# Patient Record
Sex: Male | Born: 1935 | Race: White | Hispanic: Refuse to answer | Marital: Married | Smoking: Former smoker
Health system: Southern US, Community
[De-identification: ages and names within clinical notes are randomized; demographics above are authoritative.]

## PROBLEM LIST (undated history)

## (undated) DIAGNOSIS — K635 Polyp of colon: Secondary | ICD-10-CM

## (undated) DIAGNOSIS — N400 Enlarged prostate without lower urinary tract symptoms: Secondary | ICD-10-CM

## (undated) DIAGNOSIS — K5790 Diverticulosis of intestine, part unspecified, without perforation or abscess without bleeding: Secondary | ICD-10-CM

## (undated) DIAGNOSIS — T7840XA Allergy, unspecified, initial encounter: Secondary | ICD-10-CM

## (undated) DIAGNOSIS — I10 Essential (primary) hypertension: Secondary | ICD-10-CM

## (undated) DIAGNOSIS — E785 Hyperlipidemia, unspecified: Secondary | ICD-10-CM

## (undated) DIAGNOSIS — K219 Gastro-esophageal reflux disease without esophagitis: Secondary | ICD-10-CM

## (undated) DIAGNOSIS — M1990 Unspecified osteoarthritis, unspecified site: Secondary | ICD-10-CM

## (undated) DIAGNOSIS — H269 Unspecified cataract: Secondary | ICD-10-CM

## (undated) DIAGNOSIS — R361 Hematospermia: Secondary | ICD-10-CM

## (undated) DIAGNOSIS — Z974 Presence of external hearing-aid: Secondary | ICD-10-CM

## (undated) HISTORY — DX: Diverticulosis of intestine, part unspecified, without perforation or abscess without bleeding: K57.90

## (undated) HISTORY — DX: Unspecified cataract: H26.9

## (undated) HISTORY — DX: Hyperlipidemia, unspecified: E78.5

## (undated) HISTORY — PX: UPPER GI ENDOSCOPY: SHX6162

## (undated) HISTORY — PX: CYSTECTOMY: SUR359

## (undated) HISTORY — DX: Benign prostatic hyperplasia without lower urinary tract symptoms: N40.0

## (undated) HISTORY — DX: Hematospermia: R36.1

## (undated) HISTORY — DX: Allergy, unspecified, initial encounter: T78.40XA

## (undated) HISTORY — DX: Essential (primary) hypertension: I10

## (undated) HISTORY — DX: Polyp of colon: K63.5

## (undated) HISTORY — DX: Gastro-esophageal reflux disease without esophagitis: K21.9

---
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
## 2005-10-10 ENCOUNTER — Ambulatory Visit: Payer: Self-pay | Admitting: Internal Medicine

## 2006-01-10 ENCOUNTER — Ambulatory Visit: Payer: Self-pay | Admitting: Internal Medicine

## 2006-02-23 ENCOUNTER — Ambulatory Visit: Payer: Self-pay | Admitting: Internal Medicine

## 2006-07-24 HISTORY — PX: COLONOSCOPY: SHX174

## 2006-08-15 ENCOUNTER — Ambulatory Visit: Payer: Self-pay | Admitting: Urology

## 2006-12-04 ENCOUNTER — Ambulatory Visit: Payer: Self-pay | Admitting: Internal Medicine

## 2006-12-10 ENCOUNTER — Encounter: Payer: Self-pay | Admitting: Internal Medicine

## 2006-12-10 ENCOUNTER — Ambulatory Visit: Payer: Self-pay | Admitting: Internal Medicine

## 2007-06-13 ENCOUNTER — Ambulatory Visit: Payer: Self-pay | Admitting: Ophthalmology

## 2011-07-25 DIAGNOSIS — K635 Polyp of colon: Secondary | ICD-10-CM

## 2011-07-25 HISTORY — DX: Polyp of colon: K63.5

## 2011-11-07 ENCOUNTER — Encounter: Payer: Self-pay | Admitting: Internal Medicine

## 2011-11-13 ENCOUNTER — Encounter: Payer: Self-pay | Admitting: Internal Medicine

## 2011-12-29 ENCOUNTER — Encounter: Payer: Self-pay | Admitting: Internal Medicine

## 2011-12-29 ENCOUNTER — Ambulatory Visit (AMBULATORY_SURGERY_CENTER): Payer: MEDICARE | Admitting: *Deleted

## 2011-12-29 VITALS — Ht 72.0 in | Wt 183.1 lb

## 2011-12-29 DIAGNOSIS — Z1211 Encounter for screening for malignant neoplasm of colon: Secondary | ICD-10-CM

## 2011-12-29 MED ORDER — MOVIPREP 100 G PO SOLR: ORAL | Status: DC

## 2012-01-04 ENCOUNTER — Telehealth: Payer: Self-pay | Admitting: Internal Medicine

## 2012-01-04 NOTE — Telephone Encounter (Signed)
Sounds like he should get prostrate problems taken care of first - not wrong to do colonoscopy but prepping could be symptomatic

## 2012-01-04 NOTE — Telephone Encounter (Signed)
Michael Decker pt scheduled for colon recall 01/12/12. Pt states he is having prostate trouble, he is having to go to the bathroom about every 2 hours at night and he has an "ache" down there. Pt has an appt with the urologist 01/16/12. Pt wants to know if he should keep the colon as scheduled or if he should wait until prostate trouble addressed. Dr. Leone Payor as doc of the day please advise.

## 2012-01-04 NOTE — Telephone Encounter (Signed)
Spoke with pt and he is aware. Colon appt cancelled. Pt will call us back to reschedule once he has seen the urologist.

## 2012-01-12 ENCOUNTER — Encounter: Payer: Self-pay | Admitting: Internal Medicine

## 2012-01-22 HISTORY — PX: COLONOSCOPY: SHX174

## 2012-02-12 ENCOUNTER — Ambulatory Visit (AMBULATORY_SURGERY_CENTER): Payer: MEDICARE | Admitting: Internal Medicine

## 2012-02-12 ENCOUNTER — Encounter: Payer: Self-pay | Admitting: Internal Medicine

## 2012-02-12 VITALS — BP 151/73 | HR 67 | Temp 97.7°F | Resp 18 | Ht 72.0 in | Wt 183.0 lb

## 2012-02-12 DIAGNOSIS — D126 Benign neoplasm of colon, unspecified: Secondary | ICD-10-CM

## 2012-02-12 DIAGNOSIS — Z1211 Encounter for screening for malignant neoplasm of colon: Secondary | ICD-10-CM

## 2012-02-12 DIAGNOSIS — Z86010 Personal history of colonic polyps: Secondary | ICD-10-CM

## 2012-02-12 MED ORDER — SODIUM CHLORIDE 0.9 % IV SOLN: 500.0000 mL | INTRAVENOUS | Status: DC

## 2012-02-12 NOTE — Op Note (Signed)
[facility] [address] [place], [place]  [address]  COLONOSCOPY PROCEDURE REPORT  PATIENT:  Michael, Decker  MR#:  [id number] BIRTHDATE:  [birth year], 77 yrs. old  GENDER:  male ENDOSCOPIST:  Wilhemina Bonito. Eda Keys, MD REF. BY:  Surveillance Program Recall, PROCEDURE DATE:  02/12/2012 PROCEDURE:  Colonoscopy with snare polypectomy x 1 ASA CLASS:  Class II INDICATIONS:  history of pre-cancerous (adenomatous) colon polyps, surveillance and high-risk screening ; prior exams 1996,97,99,2002,05,08 MEDICATIONS:   MAC sedation, administered by CRNA, propofol (Diprivan) 210 mg IV  DESCRIPTION OF PROCEDURE:   After the risks benefits and alternatives of the procedure were thoroughly explained, informed consent was obtained.  Digital rectal exam was performed and revealed no abnormalities.   The LB CF-[id number] endoscope was introduced through the anus and advanced to the cecum, which was identified by both the appendix and ileocecal valve, without limitations.  The quality of the prep was excellent, using MoviPrep.  The instrument was then slowly withdrawn as the colon was fully examined. <<PROCEDUREIMAGES>>  FINDINGS:  A 5mm polyp was found in the proximal transverse colon and snared without cautery. Retrieval was successful. Moderate diverticulosis was found in the sigmoid colon.  Otherwise normal colonoscopy without other polyps, masses, vascular ectasias, or inflammatory changes.   Retroflexed views in the rectum revealed no abnormalities.    The time to cecum =  2:33  minutes. The scope was then withdrawn in   9:41  minutes from the cecum and the procedure completed.  COMPLICATIONS:  None  ENDOSCOPIC IMPRESSION: 1) Diminutive polyp in the proximal transverse colon - removed 2) Moderate diverticulosis in the sigmoid colon 3) Otherwise normal colonoscopy  RECOMMENDATIONS: 1) Follow up colonoscopy in 5 years  ______________________________ Wilhemina Bonito. Eda Keys,  MD  CC:  Vonita Moss, MD; The Patient  n. eSIGNED:   Wilhemina Bonito. Eda Keys at 02/12/2012 04:46 PM  Blain Pais, [phone]

## 2012-02-12 NOTE — Progress Notes (Signed)
Patient did not experience any of the following events: a burn prior to discharge; a fall within the facility; wrong site/side/patient/procedure/implant event; or a hospital transfer or hospital admission upon discharge from the facility. (G8907) Patient did not have preoperative order for IV antibiotic SSI prophylaxis. (G8918)  

## 2012-02-12 NOTE — Patient Instructions (Addendum)

## 2012-02-13 ENCOUNTER — Telehealth: Payer: Self-pay | Admitting: *Deleted

## 2012-02-13 NOTE — Telephone Encounter (Signed)
  Follow up Call-  Call back number 02/12/2012  Post procedure Call Back phone  # [phone]  Permission to leave phone message Yes     Patient questions:  Do you have a fever, pain , or abdominal swelling? no Pain Score  0 *  Have you tolerated food without any problems? yes  Have you been able to return to your normal activities? yes  Do you have any questions about your discharge instructions: Diet   no Medications  no Follow up visit  no  Do you have questions or concerns about your Care? no  Actions: * If pain score is 4 or above: No action needed, pain <4.

## 2012-02-19 ENCOUNTER — Encounter: Payer: Self-pay | Admitting: Internal Medicine

## 2012-07-24 HISTORY — PX: BUNIONECTOMY: SHX129

## 2013-01-29 ENCOUNTER — Ambulatory Visit: Payer: Self-pay | Admitting: Anesthesiology

## 2013-01-30 ENCOUNTER — Ambulatory Visit: Payer: Self-pay | Admitting: Podiatry

## 2013-06-17 LAB — COMPREHENSIVE METABOLIC PANEL
ALK PHOS: 71 U/L
ALT: 22 U/L (ref 10–40)
AST: 28 U/L
BILIRUBIN TOTAL: 0.6 mg/dL
CREATININE: 1.24
GFR: 56
GLUCOSE: 85

## 2013-06-17 LAB — LIPID PANEL
Cholesterol: 181 mg/dL (ref 0–200)
HDL: 58 mg/dL (ref 35–70)
LDL CALC: 82
Triglycerides: 205

## 2013-06-17 LAB — VITAMIN B12: B-12, serum: 422

## 2013-06-17 LAB — TSH: TSH: 1.35

## 2013-06-17 LAB — CBC
HGB: 14.9 g/dL
PLATELET COUNT: 222
WBC: 6.9

## 2013-07-01 LAB — PSA: PSA: 0.6

## 2013-08-08 ENCOUNTER — Ambulatory Visit: Payer: MEDICARE | Admitting: Family Medicine

## 2013-10-28 ENCOUNTER — Ambulatory Visit: Payer: MEDICARE | Admitting: Family Medicine

## 2013-10-28 ENCOUNTER — Encounter: Payer: Self-pay | Admitting: Family Medicine

## 2013-10-28 ENCOUNTER — Ambulatory Visit (INDEPENDENT_AMBULATORY_CARE_PROVIDER_SITE_OTHER): Payer: Medicare HMO | Admitting: Family Medicine

## 2013-10-28 VITALS — BP 130/70 | HR 52 | Temp 97.3°F | Ht 70.5 in | Wt 191.0 lb

## 2013-10-28 DIAGNOSIS — D126 Benign neoplasm of colon, unspecified: Secondary | ICD-10-CM

## 2013-10-28 DIAGNOSIS — E785 Hyperlipidemia, unspecified: Secondary | ICD-10-CM | POA: Insufficient documentation

## 2013-10-28 DIAGNOSIS — N400 Enlarged prostate without lower urinary tract symptoms: Secondary | ICD-10-CM | POA: Insufficient documentation

## 2013-10-28 DIAGNOSIS — K635 Polyp of colon: Secondary | ICD-10-CM | POA: Insufficient documentation

## 2013-10-28 DIAGNOSIS — K219 Gastro-esophageal reflux disease without esophagitis: Secondary | ICD-10-CM | POA: Insufficient documentation

## 2013-10-28 DIAGNOSIS — I10 Essential (primary) hypertension: Secondary | ICD-10-CM

## 2013-10-28 NOTE — Assessment & Plan Note (Signed)
Chronic, stable. Continue avodart.  Pt has seen Stoioff in past.

## 2013-10-28 NOTE — Progress Notes (Signed)
BP 130/70  Pulse 52  Temp(Src) 97.3 F (36.3 C) (Oral)  Ht 5' 10.5" (1.791 m)  Wt 191 lb (86.637 kg)  BMI 27.01 kg/m2  SpO2 98%   CC: new pt to establish  Subjective:    Patient ID: Michael Decker, male    DOB: 01-07-1934, 80 y.o.   MRN: [id number]  HPI: Michael Decker is a 80 y.o. male presenting on 10/28/2013 for Establish Care   Prior saw Dr. Jeananne Rama. No questions/concerns today.  HLD - mild off meds  HTN - chronic, stable.  774-128 systolic at home.  Compliant with toprol xl 25mg  daily and micardis 80mg  daily.  Denies low bp readings or sxs like dizziness.  No HA, vision changes, CP/tightness, SOB, leg swelling.  BPH - seen by Dr. Bernardo Heater and started on avodart  Some foot paresthesias with prolonged standing.  Worse since R bunionectomy  Preventative: Last CPE 06/2013 Colonoscopy - 01/2012 with Dr. Henrene Pastor 1 TA rec rpt 5 yrs  Lives with wife Occupation: prior worked with Clinical cytogeneticist as Recruitment consultant; volunteers at [employer] for [employer] in [place],  [place]: BS Activity: works out at [employer] 3x/wk on recumbent bike Diet: some water, fruits/vegetables daily  Relevant past medical, surgical, family and social history reviewed and updated as indicated.  Allergies and medications reviewed and updated. Current Outpatient Prescriptions on File Prior to Visit  Medication Sig  . aspirin EC 81 MG tablet Take 81 mg by mouth daily.  Marland Kitchen dutasteride (AVODART) 0.5 MG capsule Take 0.5 mg by mouth every other day.  . fish oil-omega-3 fatty acids 1000 MG capsule Take 2 g by mouth daily.  . metoprolol succinate (TOPROL-XL) 25 MG 24 hr tablet Take 1 tablet by mouth Daily.  Marland Kitchen MICARDIS 80 MG tablet Take 1 tablet by mouth Daily.  . Multiple Vitamins-Minerals (MULTIVITAMIN WITH MINERALS) tablet Take 1 tablet by mouth daily.   No current facility-administered medications on file prior to visit.    Review of Systems Per HPI unless specifically indicated above    Objective:    BP 130/70   Pulse 52  Temp(Src) 97.3 F (36.3 C) (Oral)  Ht 5' 10.5" (1.791 m)  Wt 191 lb (86.637 kg)  BMI 27.01 kg/m2  SpO2 98%  Physical Exam  Nursing note and vitals reviewed. Constitutional: He is oriented to person, place, and time. He appears well-developed and well-nourished. No distress.  HENT:  Head: Normocephalic and atraumatic.  Right Ear: Hearing, tympanic membrane, external ear and ear canal normal.  Left Ear: Hearing, tympanic membrane, external ear and ear canal normal.  Nose: Nose normal.  Mouth/Throat: Uvula is midline, oropharynx is clear and moist and mucous membranes are normal. No oropharyngeal exudate, posterior oropharyngeal edema or posterior oropharyngeal erythema.  Eyes: Conjunctivae and EOM are normal. Pupils are equal, round, and reactive to light. No scleral icterus.  Neck: Normal range of motion. Neck supple. Carotid bruit is not present. No thyromegaly present.  Cardiovascular: Normal rate, regular rhythm, normal heart sounds and intact distal pulses.   No murmur heard. Pulses:      Radial pulses are 2+ on the right side, and 2+ on the left side.  Pulmonary/Chest: Effort normal and breath sounds normal. No respiratory distress. He has no wheezes. He has no rales.  Musculoskeletal: Normal range of motion. He exhibits no edema.  Lymphadenopathy:    He has no cervical adenopathy.  Neurological: He is alert and oriented to person, place, and time.  CN grossly intact, station  and gait intact  Skin: Skin is warm and dry. No rash noted.  Psychiatric: He has a normal mood and affect. His behavior is normal. Judgment and thought content normal.   No results found for this or any previous visit.    Assessment & Plan:  Pt signed ROI today for records from Dr. Jeananne Rama, this was sent off today.   Problem List Items Addressed This Visit   HTN (hypertension) - Primary     Chronic, stable. Continue meds as up to now. Mild bradycardia noted today - pt asxs. Consider  decrease in metoprolol if pt remains bradycardic next visit.    HLD (hyperlipidemia)     Mild, off meds.  Check FLP next fasting blood work.    GERD (gastroesophageal reflux disease)   BPH (benign prostatic hypertrophy)   Colon polyps       Follow up plan: Return in about 8 months (around 06/29/2014), or as needed, for annual exam, prior fasting for blood work.

## 2013-10-28 NOTE — Progress Notes (Signed)
Pre visit review using our clinic review tool, if applicable. No additional management support is needed unless otherwise documented below in the visit note. 

## 2013-10-28 NOTE — Assessment & Plan Note (Signed)
Mild, off meds.  Check FLP next fasting blood work.

## 2013-10-28 NOTE — Patient Instructions (Signed)
Good to meet you today, call us with questions. Return as needed for follow up or at end of year when next physical is due. No changes today.

## 2013-10-28 NOTE — Assessment & Plan Note (Signed)
Chronic, stable. Continue meds as up to now. Mild bradycardia noted today - pt asxs. Consider decrease in metoprolol if pt remains bradycardic next visit.

## 2013-10-29 ENCOUNTER — Telehealth: Payer: Self-pay | Admitting: Family Medicine

## 2013-10-29 NOTE — Telephone Encounter (Signed)
Relevant patient education assigned to patient using Emmi. ° °

## 2013-11-22 ENCOUNTER — Encounter: Payer: Self-pay | Admitting: Family Medicine

## 2013-11-24 ENCOUNTER — Encounter: Payer: Self-pay | Admitting: *Deleted

## 2013-11-28 DIAGNOSIS — M2010 Hallux valgus (acquired), unspecified foot: Secondary | ICD-10-CM | POA: Insufficient documentation

## 2014-01-15 ENCOUNTER — Ambulatory Visit (INDEPENDENT_AMBULATORY_CARE_PROVIDER_SITE_OTHER): Payer: Medicare HMO | Admitting: Family Medicine

## 2014-01-15 ENCOUNTER — Encounter: Payer: Self-pay | Admitting: Family Medicine

## 2014-01-15 VITALS — BP 118/60 | HR 60 | Temp 98.4°F | Wt 188.5 lb

## 2014-01-15 DIAGNOSIS — I10 Essential (primary) hypertension: Secondary | ICD-10-CM

## 2014-01-15 MED ORDER — TELMISARTAN 80 MG PO TABS: 80.0000 mg | ORAL_TABLET | Freq: Every day | ORAL | Status: DC

## 2014-01-15 MED ORDER — METOPROLOL SUCCINATE ER 25 MG PO TB24: 25.0000 mg | ORAL_TABLET | Freq: Every day | ORAL | Status: DC

## 2014-01-15 NOTE — Progress Notes (Signed)
Pre visit review using our clinic review tool, if applicable. No additional management support is needed unless otherwise documented below in the visit note. 

## 2014-01-15 NOTE — Patient Instructions (Signed)
I've printed out a prescription for 2 blood pressure medications for you. Good to see you, call us with questions. Return after November for wellness exam.

## 2014-01-15 NOTE — Progress Notes (Signed)
BP 118/60  Pulse 60  Temp(Src) 98.4 F (36.9 C) (Oral)  Wt 188 lb 8 oz (85.503 kg)   CC: med refill  Subjective:    Patient ID: Michael Decker, male    DOB: 10-Oct-1933, 80 y.o.   MRN: [id number]  HPI: Michael Decker is a 80 y.o. male presenting on 01/15/2014 for Medication Refill   HTN - Compliant with current antihypertensive regimen of telmisartan 80mg  daily and metoprolol succinate 25mg  daily.  Does check blood pressures at home: 073-710 systolic.  No low blood pressure readings or symptoms of dizziness/syncope.  Denies HA, vision changes, CP/tightness, SOB, leg swelling.   BPH - has established with Dr. Alinda Money at [facility]rology.  Relevant past medical, surgical, family and social history reviewed and updated as indicated.  Allergies and medications reviewed and updated. Current Outpatient Prescriptions on File Prior to Visit  Medication Sig  . aspirin EC 81 MG tablet Take 81 mg by mouth daily.  Marland Kitchen dutasteride (AVODART) 0.5 MG capsule Take 0.5 mg by mouth every other day.  . fish oil-omega-3 fatty acids 1000 MG capsule Take 2 g by mouth daily.  . Multiple Vitamins-Minerals (MULTIVITAMIN WITH MINERALS) tablet Take 1 tablet by mouth daily.   No current facility-administered medications on file prior to visit.    Review of Systems Per HPI unless specifically indicated above    Objective:    BP 118/60  Pulse 60  Temp(Src) 98.4 F (36.9 C) (Oral)  Wt 188 lb 8 oz (85.503 kg)  Physical Exam  Nursing note and vitals reviewed. Constitutional: He appears well-developed and well-nourished. No distress.  HENT:  Mouth/Throat: Oropharynx is clear and moist. No oropharyngeal exudate.  Neck: No thyromegaly present.  Cardiovascular: Normal rate, regular rhythm, normal heart sounds and intact distal pulses.   No murmur heard. Pulmonary/Chest: Effort normal and breath sounds normal. No respiratory distress. He has no wheezes. He has no rales.  Musculoskeletal: He exhibits no  edema.  Psychiatric: He has a normal mood and affect.   Results for orders placed in visit on 11/24/13  PSA      Result Value Ref Range   PSA 0.6    CBC      Result Value Ref Range   WBC 6.9     HGB 14.9     platelet count 222    COMPREHENSIVE METABOLIC PANEL      Result Value Ref Range   Glucose 85     Creat 1.24     GFR 56     Total Bilirubin 0.6     Alkaline Phosphatase 71     AST 28     ALT 22  10 - 40 U/L  LIPID PANEL      Result Value Ref Range   Cholesterol 181  0 - 200 mg/dL   Triglycerides 205     HDL 58  35 - 70 mg/dL   LDL (calc) 82    TSH      Result Value Ref Range   TSH 1.350    VITAMIN B12      Result Value Ref Range   B-12, serum 422        Assessment & Plan:   Problem List Items Addressed This Visit   HTN (hypertension) - Primary     Chronic, stable. Continue med regimen as up to now. Check renal panel today. Telmisartan samples provided today.    Relevant Medications      metoprolol succinate (TOPROL-XL) 24  hr tablet      telmisartan (MICARDIS) tablet   Other Relevant Orders      Renal function panel       Follow up plan: Return in about 6 months (around 07/17/2014), or if symptoms worsen or fail to improve, for annual exam, prior fasting for blood work.

## 2014-01-15 NOTE — Assessment & Plan Note (Addendum)
Chronic, stable. Continue med regimen as up to now. Check renal panel today. Telmisartan samples provided today.

## 2014-01-16 ENCOUNTER — Encounter: Payer: Self-pay | Admitting: *Deleted

## 2014-01-16 LAB — RENAL FUNCTION PANEL
Albumin: 3.8 g/dL (ref 3.5–5.2)
BUN: 21 mg/dL (ref 6–23)
CO2: 31 meq/L (ref 19–32)
Calcium: 9 mg/dL (ref 8.4–10.5)
Chloride: 103 meq/L (ref 96–112)
Creatinine, Ser: 1.3 mg/dL (ref 0.4–1.5)
GFR: 59.41 mL/min — ABNORMAL LOW (ref >60.00)
Glucose, Bld: 95 mg/dL (ref 70–99)
Phosphorus: 3.5 mg/dL (ref 2.3–4.6)
Potassium: 4.4 meq/L (ref 3.5–5.1)
Sodium: 137 meq/L (ref 135–145)

## 2014-04-29 ENCOUNTER — Ambulatory Visit (INDEPENDENT_AMBULATORY_CARE_PROVIDER_SITE_OTHER): Payer: Medicare HMO

## 2014-04-29 DIAGNOSIS — Z23 Encounter for immunization: Secondary | ICD-10-CM

## 2014-06-13 ENCOUNTER — Other Ambulatory Visit: Payer: Self-pay | Admitting: Family Medicine

## 2014-06-13 DIAGNOSIS — N400 Enlarged prostate without lower urinary tract symptoms: Secondary | ICD-10-CM

## 2014-06-13 DIAGNOSIS — I10 Essential (primary) hypertension: Secondary | ICD-10-CM

## 2014-06-13 DIAGNOSIS — E785 Hyperlipidemia, unspecified: Secondary | ICD-10-CM

## 2014-06-16 ENCOUNTER — Other Ambulatory Visit (INDEPENDENT_AMBULATORY_CARE_PROVIDER_SITE_OTHER): Payer: Medicare HMO

## 2014-06-16 DIAGNOSIS — N400 Enlarged prostate without lower urinary tract symptoms: Secondary | ICD-10-CM

## 2014-06-16 DIAGNOSIS — E785 Hyperlipidemia, unspecified: Secondary | ICD-10-CM

## 2014-06-16 DIAGNOSIS — I10 Essential (primary) hypertension: Secondary | ICD-10-CM

## 2014-06-16 LAB — LIPID PANEL
CHOLESTEROL: 196 mg/dL (ref 0–200)
HDL: 58.2 mg/dL (ref >39.00)
LDL Cholesterol: 113 mg/dL — ABNORMAL HIGH (ref 0–99)
NonHDL: 137.8
Total CHOL/HDL Ratio: 3
Triglycerides: 122 mg/dL (ref 0.0–149.0)
VLDL: 24.4 mg/dL (ref 0.0–40.0)

## 2014-06-16 LAB — RENAL FUNCTION PANEL
Albumin: 3.6 g/dL (ref 3.5–5.2)
BUN: 21 mg/dL (ref 6–23)
CO2: 29 meq/L (ref 19–32)
CREATININE: 1.1 mg/dL (ref 0.4–1.5)
Calcium: 9 mg/dL (ref 8.4–10.5)
Chloride: 102 mEq/L (ref 96–112)
GFR: 68.06 mL/min (ref >60.00)
Glucose, Bld: 87 mg/dL (ref 70–99)
Phosphorus: 2.9 mg/dL (ref 2.3–4.6)
Potassium: 4.4 mEq/L (ref 3.5–5.1)
Sodium: 139 mEq/L (ref 135–145)

## 2014-06-16 LAB — PSA: PSA: 0.53 ng/mL (ref 0.10–4.00)

## 2014-06-23 ENCOUNTER — Ambulatory Visit (INDEPENDENT_AMBULATORY_CARE_PROVIDER_SITE_OTHER): Payer: Medicare HMO | Admitting: Family Medicine

## 2014-06-23 ENCOUNTER — Encounter: Payer: Self-pay | Admitting: Family Medicine

## 2014-06-23 VITALS — BP 120/68 | HR 56 | Temp 97.7°F | Ht 71.5 in | Wt 191.2 lb

## 2014-06-23 DIAGNOSIS — Z23 Encounter for immunization: Secondary | ICD-10-CM

## 2014-06-23 DIAGNOSIS — Z7189 Other specified counseling: Secondary | ICD-10-CM | POA: Insufficient documentation

## 2014-06-23 DIAGNOSIS — E785 Hyperlipidemia, unspecified: Secondary | ICD-10-CM

## 2014-06-23 DIAGNOSIS — Z0000 Encounter for general adult medical examination without abnormal findings: Secondary | ICD-10-CM

## 2014-06-23 DIAGNOSIS — I10 Essential (primary) hypertension: Secondary | ICD-10-CM

## 2014-06-23 DIAGNOSIS — K635 Polyp of colon: Secondary | ICD-10-CM

## 2014-06-23 DIAGNOSIS — N400 Enlarged prostate without lower urinary tract symptoms: Secondary | ICD-10-CM

## 2014-06-23 DIAGNOSIS — J3489 Other specified disorders of nose and nasal sinuses: Secondary | ICD-10-CM | POA: Insufficient documentation

## 2014-06-23 NOTE — Assessment & Plan Note (Signed)
Chronic, stable nocturia x1-2. Followed by Dr Alinda Money.

## 2014-06-23 NOTE — Progress Notes (Signed)
BP 120/68 mmHg  Pulse 56  Temp(Src) 97.7 F (36.5 C) (Oral)  Ht 5' 11.5" (1.816 m)  Wt 191 lb 4 oz (86.75 kg)  BMI 26.30 kg/m2   CC: medicare wellness visit  Subjective:    Patient ID: Michael Decker, male    DOB: April 20, 1934, 80 y.o.   MRN: [id number]  HPI: Michael Decker is a 80 y.o. male presenting on 06/23/2014 for Annual Exam   Chronic bilateral rhinorrhea. In am associated with sinus congestion. Allergic to dust mites. Prior on allergy shots which helped Donneta Romberg). Chlorpheniramine helped as well.  Preventative: Colonoscopy - 01/2012 with Dr. Henrene Pastor 1 TA rec rpt 5 yrs Prostate - checked by Dr Alinda Money - h/o BPH. On avodart. Flu 04/2014 Pneumovax 2002, prevnar today Tdap 2010 zostavax 2013 Advanced planning - has at home. Wife is HCPOA then daughter.  Lives with wife Occupation: prior worked with Clinical cytogeneticist as Recruitment consultant; volunteers at [employer] for [employer] in [place],  [employer]: BS Activity: works out at [employer] 3x/wk on recumbent bike Diet: some water, fruits/vegetables daily  Relevant past medical, surgical, family and social history reviewed and updated as indicated. Interim medical history since our last visit reviewed. Allergies and medications reviewed and updated.  Current Outpatient Prescriptions on File Prior to Visit  Medication Sig  . aspirin EC 81 MG tablet Take 81 mg by mouth daily.  Marland Kitchen dutasteride (AVODART) 0.5 MG capsule Take 0.5 mg by mouth every other day.  . fish oil-omega-3 fatty acids 1000 MG capsule Take 2 g by mouth daily.  . metoprolol succinate (TOPROL-XL) 25 MG 24 hr tablet Take 1 tablet (25 mg total) by mouth daily.  . Multiple Vitamins-Minerals (MULTIVITAMIN WITH MINERALS) tablet Take 1 tablet by mouth daily.  Marland Kitchen telmisartan (MICARDIS) 80 MG tablet Take 1 tablet (80 mg total) by mouth daily.   No current facility-administered medications on file prior to visit.    Review of Systems Per HPI unless specifically indicated above       Objective:    BP 120/68 mmHg  Pulse 56  Temp(Src) 97.7 F (36.5 C) (Oral)  Ht 5' 11.5" (1.816 m)  Wt 191 lb 4 oz (86.75 kg)  BMI 26.30 kg/m2  Wt Readings from Last 3 Encounters:  06/23/14 191 lb 4 oz (86.75 kg)  01/15/14 188 lb 8 oz (85.503 kg)  10/28/13 191 lb (86.637 kg)    Physical Exam  Constitutional: He is oriented to person, place, and time. He appears well-developed and well-nourished. No distress.  HENT:  Head: Normocephalic and atraumatic.  Right Ear: Hearing, tympanic membrane, external ear and ear canal normal.  Left Ear: Hearing, tympanic membrane, external ear and ear canal normal.  Nose: Nose normal.  Mouth/Throat: Uvula is midline, oropharynx is clear and moist and mucous membranes are normal. No oropharyngeal exudate, posterior oropharyngeal edema or posterior oropharyngeal erythema.  Eyes: Conjunctivae and EOM are normal. Pupils are equal, round, and reactive to light. No scleral icterus.  Neck: Normal range of motion. Neck supple. Carotid bruit is not present. No thyromegaly present.  Cardiovascular: Normal rate, regular rhythm, normal heart sounds and intact distal pulses.   No murmur heard. Pulses:      Radial pulses are 2+ on the right side, and 2+ on the left side.  Pulmonary/Chest: Effort normal and breath sounds normal. No respiratory distress. He has no wheezes. He has no rales.  Abdominal: Soft. Bowel sounds are normal. He exhibits no distension and no mass. There is no  tenderness. There is no rebound and no guarding.  Musculoskeletal: Normal range of motion. He exhibits no edema.  Lymphadenopathy:    He has no cervical adenopathy.  Neurological: He is alert and oriented to person, place, and time.  CN grossly intact, station and gait intact Recall 3/3  calculation 5/5 serial 7s  Skin: Skin is warm and dry. No rash noted.  Psychiatric: He has a normal mood and affect. His behavior is normal. Judgment and thought content normal.  Nursing note and  vitals reviewed.  Results for orders placed or performed in visit on 06/16/14  PSA  Result Value Ref Range   PSA 0.53 0.10 - 4.00 ng/mL  Lipid panel  Result Value Ref Range   Cholesterol 196 0 - 200 mg/dL   Triglycerides 122.0 0.0 - 149.0 mg/dL   HDL 58.20 >39.00 mg/dL   VLDL 24.4 0.0 - 40.0 mg/dL   LDL Cholesterol 113 (H) 0 - 99 mg/dL   Total CHOL/HDL Ratio 3    NonHDL 137.80   Renal function panel  Result Value Ref Range   Sodium 139 135 - 145 mEq/L   Potassium 4.4 3.5 - 5.1 mEq/L   Chloride 102 96 - 112 mEq/L   CO2 29 19 - 32 mEq/L   Calcium 9.0 8.4 - 10.5 mg/dL   Albumin 3.6 3.5 - 5.2 g/dL   BUN 21 6 - 23 mg/dL   Creatinine, Ser 1.1 0.4 - 1.5 mg/dL   Glucose, Bld 87 70 - 99 mg/dL   Phosphorus 2.9 2.3 - 4.6 mg/dL   GFR 68.06 >60.00 mL/min      Assessment & Plan:   Problem List Items Addressed This Visit    Rhinorrhea    Chronic rhinorrhea in h/o allergic rhinitis. Discussed options. Will continue chlortrimeton at night time, add on daytime long acting antihistamine like claritin or allegra. If this is ineffective, would add astelin nasal spray.    Medicare annual wellness visit, subsequent - Primary    I have personally reviewed the Medicare Annual Wellness questionnaire and have noted 1. The patient's medical and social history 2. Their use of alcohol, tobacco or illicit drugs 3. Their current medications and supplements 4. The patient's functional ability including ADL's, fall risks, home safety risks and hearing or visual impairment. 5. Diet and physical activity 6. Evidence for depression or mood disorders The patients weight, height, BMI have been recorded in the chart.  Hearing and vision has been addressed. I have made referrals, counseling and provided education to the patient based review of the above and I have provided the pt with a written personalized care plan for preventive services. Provider list updated - see scanned questionairre.  Reviewed  preventative protocols and updated unless pt declined.     HTN (hypertension)    Chronic, stable. Continue regimen.    HLD (hyperlipidemia)    Mild off meds. Continue fish oil. Reviewed #s.    Colon polyps    Consider rpt 2018 2/2 h/o TA Henrene Pastor)    BPH (benign prostatic hypertrophy)    Chronic, stable nocturia x1-2. Followed by Dr Alinda Money.    Advanced care planning/counseling discussion    Advanced planning - has at home. Wife is HCPOA then daughter.        Follow up plan: Return in about 1 year (around 06/24/2015), or as needed, for medicare wellness.

## 2014-06-23 NOTE — Assessment & Plan Note (Signed)
Chronic, stable. Continue regimen. 

## 2014-06-23 NOTE — Patient Instructions (Addendum)
prevnar today. Bring me copy of living will/advanced directive to update your chart. Continue chlorpheniramine at night time. May add longer acting antihistamine during the day like allegra or claritin. Let me know if this doesn't help - may try astelin nasal spray. Return as needed or in 1 year for next wellness visit

## 2014-06-23 NOTE — Addendum Note (Signed)
Addended by: Emelia Salisbury C on: 06/23/2014 12:04 PM   Modules accepted: Orders

## 2014-06-23 NOTE — Assessment & Plan Note (Signed)
Chronic rhinorrhea in h/o allergic rhinitis. Discussed options. Will continue chlortrimeton at night time, add on daytime long acting antihistamine like claritin or allegra. If this is ineffective, would add astelin nasal spray.

## 2014-06-23 NOTE — Assessment & Plan Note (Signed)
Advanced planning - has at home. Wife is HCPOA then daughter.

## 2014-06-23 NOTE — Assessment & Plan Note (Signed)
Consider rpt 2018 2/2 h/o TA Henrene Pastor)

## 2014-06-23 NOTE — Assessment & Plan Note (Signed)

## 2014-06-23 NOTE — Assessment & Plan Note (Signed)
Mild off meds. Continue fish oil. Reviewed #s.

## 2014-06-23 NOTE — Progress Notes (Signed)
Pre visit review using our clinic review tool, if applicable. No additional management support is needed unless otherwise documented below in the visit note. 

## 2014-08-13 ENCOUNTER — Telehealth: Payer: Self-pay | Admitting: Internal Medicine

## 2014-08-17 ENCOUNTER — Ambulatory Visit (INDEPENDENT_AMBULATORY_CARE_PROVIDER_SITE_OTHER): Payer: Medicare HMO | Admitting: Physician Assistant

## 2014-08-17 ENCOUNTER — Other Ambulatory Visit (INDEPENDENT_AMBULATORY_CARE_PROVIDER_SITE_OTHER): Payer: Medicare HMO

## 2014-08-17 VITALS — BP 122/54 | HR 64 | Ht 70.5 in | Wt 193.4 lb

## 2014-08-17 DIAGNOSIS — R12 Heartburn: Secondary | ICD-10-CM

## 2014-08-17 DIAGNOSIS — R1011 Right upper quadrant pain: Secondary | ICD-10-CM

## 2014-08-17 DIAGNOSIS — G8929 Other chronic pain: Secondary | ICD-10-CM

## 2014-08-17 DIAGNOSIS — R1010 Upper abdominal pain, unspecified: Secondary | ICD-10-CM

## 2014-08-17 DIAGNOSIS — R1013 Epigastric pain: Secondary | ICD-10-CM

## 2014-08-17 LAB — COMPREHENSIVE METABOLIC PANEL
ALBUMIN: 3.6 g/dL (ref 3.5–5.2)
ALK PHOS: 57 U/L (ref 39–117)
ALT: 18 U/L (ref 0–53)
AST: 22 U/L (ref 0–37)
BILIRUBIN TOTAL: 0.6 mg/dL (ref 0.2–1.2)
BUN: 24 mg/dL — ABNORMAL HIGH (ref 6–23)
CALCIUM: 9.2 mg/dL (ref 8.4–10.5)
CO2: 28 meq/L (ref 19–32)
CREATININE: 1.15 mg/dL (ref 0.40–1.50)
Chloride: 103 mEq/L (ref 96–112)
GFR: 65.31 mL/min (ref >60.00)
Glucose, Bld: 105 mg/dL — ABNORMAL HIGH (ref 70–99)
Potassium: 4.3 mEq/L (ref 3.5–5.1)
Sodium: 137 mEq/L (ref 135–145)
TOTAL PROTEIN: 6.6 g/dL (ref 6.0–8.3)

## 2014-08-17 LAB — CBC WITH DIFFERENTIAL/PLATELET
BASOS PCT: 0.4 % (ref 0.0–3.0)
Basophils Absolute: 0 10*3/uL (ref 0.0–0.1)
Eosinophils Absolute: 0.2 10*3/uL (ref 0.0–0.7)
Eosinophils Relative: 2.5 % (ref 0.0–5.0)
HCT: 41.9 % (ref 39.0–52.0)
Hemoglobin: 14.3 g/dL (ref 13.0–17.0)
LYMPHS ABS: 1.7 10*3/uL (ref 0.7–4.0)
Lymphocytes Relative: 21 % (ref 12.0–46.0)
MCHC: 34.2 g/dL (ref 30.0–36.0)
MCV: 95.6 fl (ref 78.0–100.0)
MONO ABS: 0.8 10*3/uL (ref 0.1–1.0)
Monocytes Relative: 9.7 % (ref 3.0–12.0)
NEUTROS ABS: 5.2 10*3/uL (ref 1.4–7.7)
Neutrophils Relative %: 66.4 % (ref 43.0–77.0)
Platelets: 196 10*3/uL (ref 150.0–400.0)
RBC: 4.38 Mil/uL (ref 4.22–5.81)
RDW: 12.8 % (ref 11.5–15.5)
WBC: 7.9 10*3/uL (ref 4.0–10.5)

## 2014-08-17 LAB — LIPASE: Lipase: 16 U/L (ref 11.0–59.0)

## 2014-08-17 MED ORDER — OMEPRAZOLE 40 MG PO CPDR: DELAYED_RELEASE_CAPSULE | ORAL | Status: DC

## 2014-08-17 NOTE — Progress Notes (Signed)
Patient ID: Michael Decker, male   DOB: [birth year], 81 y.o.   MRN: [id number]   Subjective:    Patient ID: Michael Decker, male    DOB: November 04, 1933, 81 y.o.   MRN: [id number]  HPI  Jaben is a pleasant 81 year old white male known to Dr. Henrene Pastor. He has history of adenomatous colon polyps and diverticulosis. Other medical problems include BPH hypertension and hyperlipidemia. He comes in today with complaints of a 3 week history of heartburn and indigestion. He says he has never had any chronic problems with heartburn but has intermittently had to use Tums or Rolaids. He says he started having more daily symptoms about 3 weeks ago and was eating Tums on a regular basis. About 5 days ago he bought over-the-counter Prilosec and has been taking that. He says the Prilosec helped quite a bit with the heartburn but he now has an unsettled uncomfortable feeling in his upper abdomen primarily in the right side past 3 days. Says she's also cut out coffee and caffeine. His appetite has been okay does not feel any worse postprandially. She's not had any nausea or vomiting his aphasia. No change in his bowel habits melena or hematochezia and his weight has been stable. He has been taking a baby aspirin daily prophylactically no other regular aspirin or NSAIDs. He is concerned about the general upper abdominal and right-sided discomfort. He is leaving for [place] for about 18 days later this week and wanted to get checked before he left on his trip. N o prior abdominal surgery. No regular EtOH Remote EGD was apparently unremarkable.  Review of Systems Pertinent positive and negative review of systems were noted in the above HPI section.  All other review of systems was otherwise negative.  Outpatient Encounter Prescriptions as of 08/17/2014  Medication Sig  . aspirin EC 81 MG tablet Take 81 mg by mouth daily.  Marland Kitchen dutasteride (AVODART) 0.5 MG capsule Take 0.5 mg by mouth every other day.  . fish oil-omega-3 fatty acids  1000 MG capsule Take 2 g by mouth daily.  . metoprolol succinate (TOPROL-XL) 25 MG 24 hr tablet Take 1 tablet (25 mg total) by mouth daily.  . Multiple Vitamins-Minerals (MULTIVITAMIN WITH MINERALS) tablet Take 1 tablet by mouth daily.  Marland Kitchen POTASSIUM CHLORIDE PO Take by mouth. 2 tablets per week  . telmisartan (MICARDIS) 80 MG tablet Take 1 tablet (80 mg total) by mouth daily.  . [DISCONTINUED] omeprazole (PRILOSEC OTC) 20 MG tablet Take 20 mg by mouth daily.  Marland Kitchen omeprazole (PRILOSEC) 40 MG capsule Take 1 tab in the morning.  . [DISCONTINUED] chlorpheniramine (CHLORPHEN) 4 MG tablet Take 4 mg by mouth at bedtime.   No Known Allergies Patient Active Problem List   Diagnosis Date Noted  . Medicare annual wellness visit, subsequent 06/23/2014  . Advanced care planning/counseling discussion 06/23/2014  . Rhinorrhea 06/23/2014  . HTN (hypertension)   . HLD (hyperlipidemia)   . GERD (gastroesophageal reflux disease)   . BPH (benign prostatic hypertrophy)   . Colon polyps    History   Social History  . Marital Status: Married    Spouse Name: N/A    Number of Children: N/A  . Years of Education: N/A   Occupational History  . Not on file.   Social History Main Topics  . Smoking status: Never Smoker   . Smokeless tobacco: Never Used  . Alcohol Use: 4.2 oz/week    7 Cans of beer per week     Comment:  one beer a night (12 oz)  . Drug Use: No  . Sexual Activity:    Partners: Female   Other Topics Concern  . Not on file   Social History Narrative   Lives with wife   Grown children   Occupation: prior worked with Clinical cytogeneticist as Recruitment consultant; volunteers at [employer] in [place]   Edu: BS   Activity: works out at [employer] 3x/wk, on recumbent bike   Diet: some water, fruits/vegetables daily             Mr. Markovitz family history includes CAD in his brother; Sudden death (age of onset: 38) in his father. There is no history of Cancer, Diabetes, or Stroke.        Objective:    Filed Vitals:   08/17/14 1334  BP: 122/54  Pulse: 64    Physical Exam  well-developed older white male in no acute distress, quite pleasant blood pressure 122/54 pulse 64 height 5 foot 10 weight 193. HEENT; nontraumatic normocephalic EOMI PERRLA sclera anicteric, Supple ;no JVD, Cardiovascular ;regular rate and rhythm with S1-S2 no murmur or gallop, Pulmonary ;clear bilaterally, Abdomen ;soft, bowel sounds are present he has some mild tenderness in the epigastrium and hypogastrium there is no guarding or rebound no palpable mass or hepatosplenomegaly bowel sounds are present, Rectal; exam not done, Extremities; no clubbing cyanosis or edema skin warm and dry, Psych; mood and affect appropriate       Assessment & Plan:   #12 81 year old white male with 3 week history of heartburn and indigestion as well as upper abdominal discomfort last dyspepsia. Heartburn improved with PPI therapy Etiology of his symptoms is not clear he may have gastritis, peptic ulcer disease, cannot rule out biliary disease #2 history of adenomatous colon polyps up-to-date with colonoscopy last July 2013 and slated for 5 year interval follow-up  Plan; Baseline labs today to include CBC with differential, CMET,and lipase Start prescription strength Prilosec 40 mg by mouth every morning Hold baby aspirin for the next 2 weeks Patient is asked to call when he returns from [place]- if he is still having symptoms will need to be reevaluated and will need imaging and/or endoscopic evaluation at that time.  Idabelle Mcpeters S Tzipora Mcinroy PA-C 08/17/2014

## 2014-08-17 NOTE — Patient Instructions (Signed)
We sent a prescription to Michael Decker for Omeprazole 40 mg.  Please go to the basement level to have your labs drawn.

## 2014-08-18 NOTE — Progress Notes (Signed)
Agree with initial assessment and plans as outlined 

## 2014-09-12 ENCOUNTER — Other Ambulatory Visit: Payer: Self-pay | Admitting: Family Medicine

## 2014-09-19 ENCOUNTER — Other Ambulatory Visit: Payer: Self-pay | Admitting: Family Medicine

## 2014-10-15 ENCOUNTER — Telehealth: Payer: Self-pay | Admitting: Physician Assistant

## 2014-10-15 NOTE — Telephone Encounter (Signed)
No answer no voice mail  

## 2014-10-15 NOTE — Telephone Encounter (Signed)
Patient reports he completed a 30 day course of Prilosec 40mg . He tapered himself off. He had a full return of his GI sx's about 1 week later. He has restarted the Prilosec. Should he have the EGD?

## 2014-10-19 ENCOUNTER — Other Ambulatory Visit: Payer: Self-pay

## 2014-10-19 DIAGNOSIS — K219 Gastro-esophageal reflux disease without esophagitis: Secondary | ICD-10-CM

## 2014-10-19 NOTE — Telephone Encounter (Signed)
See previous note

## 2014-10-19 NOTE — Telephone Encounter (Signed)
Discussed with the patient. He agrees to the plan. Pre-visit 10/27/14 and EGD 11/13/14

## 2014-10-19 NOTE — Telephone Encounter (Signed)
I have left message for the patient to call back 

## 2014-10-19 NOTE — Telephone Encounter (Signed)
Yes, he should  go ahead with EGD-please schedule with Dr. Henrene Pastor

## 2014-10-26 ENCOUNTER — Encounter: Payer: Self-pay | Admitting: Internal Medicine

## 2014-10-27 ENCOUNTER — Ambulatory Visit (AMBULATORY_SURGERY_CENTER): Payer: Self-pay | Admitting: *Deleted

## 2014-10-27 VITALS — Ht 72.0 in | Wt 192.0 lb

## 2014-10-27 DIAGNOSIS — R1013 Epigastric pain: Secondary | ICD-10-CM

## 2014-10-27 NOTE — Progress Notes (Signed)
No egg or soy allergy. No anesthesia problems.  No home O2.  No diet meds.  

## 2014-11-13 ENCOUNTER — Ambulatory Visit (AMBULATORY_SURGERY_CENTER): Payer: Medicare HMO | Admitting: Internal Medicine

## 2014-11-13 ENCOUNTER — Encounter: Payer: Self-pay | Admitting: Internal Medicine

## 2014-11-13 VITALS — BP 127/61 | HR 45 | Temp 97.9°F | Resp 14 | Ht 72.0 in | Wt 192.0 lb

## 2014-11-13 DIAGNOSIS — R1013 Epigastric pain: Secondary | ICD-10-CM

## 2014-11-13 DIAGNOSIS — K2100 Gastro-esophageal reflux disease with esophagitis, without bleeding: Secondary | ICD-10-CM

## 2014-11-13 MED ORDER — SODIUM CHLORIDE 0.9 % IV SOLN: 500.0000 mL | INTRAVENOUS | Status: DC

## 2014-11-13 NOTE — Progress Notes (Signed)
A/ox3 pleased with MAC, report to Penny RN 

## 2014-11-13 NOTE — Op Note (Signed)
[facility]  [address] [place], [address]   ENDOSCOPY PROCEDURE REPORT  PATIENT: Michael Decker, Michael Decker  MR#: [id number] BIRTHDATE: 03/31/1934 , 62  yrs. old GENDER: male ENDOSCOPIST: Eustace Quail, MD REFERRED BY:  .  Self / Office PROCEDURE DATE:  11/13/2014 PROCEDURE:  EGD, diagnostic ASA CLASS:     Class II INDICATIONS:  epigastric pain and history of esophageal reflux - new onset. MEDICATIONS: Monitored anesthesia care and Propofol 120 mg IV TOPICAL ANESTHETIC: none  DESCRIPTION OF PROCEDURE: After the risks benefits and alternatives of the procedure were thoroughly explained, informed consent was obtained.  The LB [id number] endoscope was introduced through the mouth and advanced to the second portion of the duodenum , Without limitations.  The instrument was slowly withdrawn as the mucosa was fully examined.     EXAM: The esophagus revealed mild esophagitis (erosion) at the gastroesophageal junction. The esophagus was otherwise completely normal in appearance.  The stomach was entered and closely examined and found to be normal. The antrum, angularis, and lesser curvature were well visualized, including a retroflexed view of the cardia and fundus.  The stomach wall was normally distensable.  The scope passed easily through the pylorus into the duodenum, which was normal.  Retroflexed views revealed no abnormalities.     The scope was then withdrawn from the patient and the procedure completed.  COMPLICATIONS: There were no immediate complications.  ENDOSCOPIC IMPRESSION: 1. GERD with mild esophagitis 2. Otherwise Normal EGD  RECOMMENDATIONS: 1.  Anti-reflux regimen to be followed 2.  Continue PPI (omeprazole)  REPEAT EXAM:  eSigned:  Eustace Quail, MD 11/13/2014 10:42 AM    RP:[id number], Garlon Hatchet MD and The Patient

## 2014-11-13 NOTE — Patient Instructions (Signed)
YOU HAD AN ENDOSCOPIC PROCEDURE TODAY AT [facility]:   Refer to the procedure report that was given to you for any specific questions about what was found during the examination.  If the procedure report does not answer your questions, please call your gastroenterologist to clarify.  If you requested that your care partner not be given the details of your procedure findings, then the procedure report has been included in a sealed envelope for you to review at your convenience later.  YOU SHOULD EXPECT: Some feelings of bloating in the abdomen. Passage of more gas than usual.  Walking can help get rid of the air that was put into your GI tract during the procedure and reduce the bloating. If you had a lower endoscopy (such as a colonoscopy or flexible sigmoidoscopy) you may notice spotting of blood in your stool or on the toilet paper. If you underwent a bowel prep for your procedure, you may not have a normal bowel movement for a few days.  Please Note:  You might notice some irritation and congestion in your nose or some drainage.  This is from the oxygen used during your procedure.  There is no need for concern and it should clear up in a day or so.  SYMPTOMS TO REPORT IMMEDIATELY:     Following upper endoscopy (EGD)  Vomiting of blood or coffee ground material  New chest pain or pain under the shoulder blades  Painful or persistently difficult swallowing  New shortness of breath  Fever of 100F or higher  Black, tarry-looking stools  For urgent or emergent issues, a gastroenterologist can be reached at any hour by calling [phone].   DIET: Your first meal following the procedure should be a small meal and then it is ok to progress to your normal diet. Heavy or fried foods are harder to digest and may make you feel nauseous or bloated.  Likewise, meals heavy in dairy and vegetables can increase bloating.  Drink plenty of fluids but you should avoid alcoholic beverages  for 24 hours.  ACTIVITY:  You should plan to take it easy for the rest of today and you should NOT DRIVE or use heavy machinery until tomorrow (because of the sedation medicines used during the test).    FOLLOW UP: Our staff will call the number listed on your records the next business day following your procedure to check on you and address any questions or concerns that you may have regarding the information given to you following your procedure. If we do not reach you, we will leave a message.  However, if you are feeling well and you are not experiencing any problems, there is no need to return our call.  We will assume that you have returned to your regular daily activities without incident.  If any biopsies were taken you will be contacted by phone or by letter within the next 1-3 weeks.  Please call us at [phone] if you have not heard about the biopsies in 3 weeks.    SIGNATURES/CONFIDENTIALITY: You and/or your care partner have signed paperwork which will be entered into your electronic medical record.  These signatures attest to the fact that that the information above on your After Visit Summary has been reviewed and is understood.  Full responsibility of the confidentiality of this discharge information lies with you and/or your care-partner.   Information on GERD given to you today  Continue Omeprazole

## 2014-11-16 ENCOUNTER — Telehealth: Payer: Self-pay

## 2014-11-16 NOTE — Telephone Encounter (Signed)
No answer or voice mail for follow up call.

## 2014-12-22 ENCOUNTER — Telehealth: Payer: Self-pay | Admitting: Physician Assistant

## 2014-12-22 DIAGNOSIS — Z800 Family history of malignant neoplasm of digestive organs: Secondary | ICD-10-CM

## 2014-12-22 DIAGNOSIS — Z1211 Encounter for screening for malignant neoplasm of colon: Secondary | ICD-10-CM

## 2014-12-22 MED ORDER — OMEPRAZOLE 40 MG PO CPDR: DELAYED_RELEASE_CAPSULE | ORAL | Status: DC

## 2014-12-22 NOTE — Telephone Encounter (Signed)
LM with Mrs. Custard that I did send today the prescription for Omeprazole 40 mg, # 90 with 3 refills. Seh advised she will tell her husband.

## 2014-12-22 NOTE — Telephone Encounter (Signed)
Spoke to [place] at [place] it is ok to do the 90 supply of Omeprazole 40 mg,  #90 with 3 refills, Amy Esterwood PA perscriber.

## 2015-01-15 ENCOUNTER — Encounter: Payer: Medicare HMO | Admitting: Internal Medicine

## 2015-02-23 ENCOUNTER — Ambulatory Visit (INDEPENDENT_AMBULATORY_CARE_PROVIDER_SITE_OTHER): Payer: Medicare HMO | Admitting: Family Medicine

## 2015-02-23 ENCOUNTER — Encounter: Payer: Self-pay | Admitting: Family Medicine

## 2015-02-23 VITALS — BP 118/58 | HR 56 | Temp 98.2°F | Wt 191.8 lb

## 2015-02-23 DIAGNOSIS — M545 Low back pain, unspecified: Secondary | ICD-10-CM | POA: Insufficient documentation

## 2015-02-23 NOTE — Progress Notes (Signed)
Pre visit review using our clinic review tool, if applicable. No additional management support is needed unless otherwise documented below in the visit note. 

## 2015-02-23 NOTE — Assessment & Plan Note (Signed)
No red flags today. Anticipate lumbar strain + osteoarthritis related, but no evidence of radiculopathy or pinching nerves. Treat supportively with tylenol 500mg  QD - BID, recommended start stretching/strengthening exercises. Given ongoing for 3 months, suggested rpt lumbar films at our office. Pt declines for now, will call in 2-3 wks with update. Update if not improving with treatment plan for further evaluation.

## 2015-02-23 NOTE — Progress Notes (Signed)
BP 118/58 mmHg  Pulse 56  Temp(Src) 98.2 F (36.8 C) (Oral)  Wt 191 lb 12 oz (86.977 kg)   CC: LBP  Subjective:    Patient ID: Michael Decker, male    DOB: [birth year], 81 y.o.   MRN: [id number]  HPI: Michael Decker is a 81 y.o. male presenting on 02/23/2015 for Back Pain   Intermittent lower back pain over last several years. Usually gets better after 2-3 weeks.   Now with several mo h/o lower back pain (started end of April) s/p 16 treatments by chiropractor over 6 wks - improved but still tender. This time not improving as would be expected. Currently 3/10 pain. As bad as 6/10. Denies inciting trauma/falls/injury. Denies shooting pain down legs, numbness, weakness, saddle anesthesia, fevers/chills, bowel/bladder accidents. No known h/o cancer. No UTI sxs, no hematuria. Worse first thing in the morning when he wakes up.  Had xray done by chiropractor - told had loss of curve of spine.  Has tried biofreeze but no other medication for this. Has stopped going to gym.   Not interested at all in surgery.  Relevant past medical, surgical, family and social history reviewed and updated as indicated. Interim medical history since our last visit reviewed. Allergies and medications reviewed and updated. Current Outpatient Prescriptions on File Prior to Visit  Medication Sig  . aspirin EC 81 MG tablet Take 81 mg by mouth daily.  Marland Kitchen dutasteride (AVODART) 0.5 MG capsule Take 0.5 mg by mouth every other day.  . fish oil-omega-3 fatty acids 1000 MG capsule Take 1 g by mouth daily.   . Multiple Vitamins-Minerals (MULTIVITAMIN WITH MINERALS) tablet Take 1 tablet by mouth daily.  Marland Kitchen omeprazole (PRILOSEC) 40 MG capsule Take 1 tab in the morning. (Patient taking differently: Take 40 mg by mouth every other day. Take 1 tab in the morning.)  . telmisartan (MICARDIS) 80 MG tablet TAKE 1 TABLET BY MOUTH DAILY   No current facility-administered medications on file prior to visit.    Review of  Systems Per HPI unless specifically indicated above     Objective:    BP 118/58 mmHg  Pulse 56  Temp(Src) 98.2 F (36.8 C) (Oral)  Wt 191 lb 12 oz (86.977 kg)  Wt Readings from Last 3 Encounters:  02/23/15 191 lb 12 oz (86.977 kg)  11/13/14 192 lb (87.091 kg)  10/27/14 192 lb (87.091 kg)    Physical Exam  Constitutional: He appears well-developed and well-nourished. No distress.  Musculoskeletal: He exhibits no edema.  Mild discomfort midline lumbar spine as well as paraspinous mm R>L Neg SLR bilaterally. No pain with int/ext rotation at hip. Neg FABER. No pain at SIJ, GTB or sciatic notch bilaterally.   Neurological:  Sensation intact  Strength 5/5 BLE  Nursing note and vitals reviewed.  Results for orders placed or performed in visit on 08/17/14  CBC with Differential/Platelet  Result Value Ref Range   WBC 7.9 4.0 - 10.5 K/uL   RBC 4.38 4.22 - 5.81 Mil/uL   Hemoglobin 14.3 13.0 - 17.0 g/dL   HCT 41.9 39.0 - 52.0 %   MCV 95.6 78.0 - 100.0 fl   MCHC 34.2 30.0 - 36.0 g/dL   RDW 12.8 11.5 - 15.5 %   Platelets 196.0 150.0 - 400.0 K/uL   Neutrophils Relative % 66.4 43.0 - 77.0 %   Lymphocytes Relative 21.0 12.0 - 46.0 %   Monocytes Relative 9.7 3.0 - 12.0 %   Eosinophils Relative 2.5 0.0 -  5.0 %   Basophils Relative 0.4 0.0 - 3.0 %   Neutro Abs 5.2 1.4 - 7.7 K/uL   Lymphs Abs 1.7 0.7 - 4.0 K/uL   Monocytes Absolute 0.8 0.1 - 1.0 K/uL   Eosinophils Absolute 0.2 0.0 - 0.7 K/uL   Basophils Absolute 0.0 0.0 - 0.1 K/uL  Comprehensive metabolic panel  Result Value Ref Range   Sodium 137 135 - 145 mEq/L   Potassium 4.3 3.5 - 5.1 mEq/L   Chloride 103 96 - 112 mEq/L   CO2 28 19 - 32 mEq/L   Glucose, Bld 105 (H) 70 - 99 mg/dL   BUN 24 (H) 6 - 23 mg/dL   Creatinine, Ser 1.15 0.40 - 1.50 mg/dL   Total Bilirubin 0.6 0.2 - 1.2 mg/dL   Alkaline Phosphatase 57 39 - 117 U/L   AST 22 0 - 37 U/L   ALT 18 0 - 53 U/L   Total Protein 6.6 6.0 - 8.3 g/dL   Albumin 3.6 3.5 - 5.2  g/dL   Calcium 9.2 8.4 - 10.5 mg/dL   GFR 65.31 >60.00 mL/min  Lipase  Result Value Ref Range   Lipase 16.0 11.0 - 59.0 U/L      Assessment & Plan:   Problem List Items Addressed This Visit    LBP (low back pain) - Primary    No red flags today. Anticipate lumbar strain + osteoarthritis related, but no evidence of radiculopathy or pinching nerves. Treat supportively with tylenol 500mg  QD - BID, recommended start stretching/strengthening exercises. Given ongoing for 3 months, suggested rpt lumbar films at our office. Pt declines for now, will call in 2-3 wks with update. Update if not improving with treatment plan for further evaluation.          Follow up plan: Return if symptoms worsen or fail to improve.

## 2015-02-23 NOTE — Patient Instructions (Addendum)
Decrease metoprolol (toprol xl) to 1/2 tablet daily  I think this is combination of lumbar strain and some arthritis in the lower back. Start tylenol 500mg  once daily (in am or pm).  Do strengthening exercises provided by chiropractor. If no improvement with this, return for lumbar xrays.

## 2015-03-18 ENCOUNTER — Encounter: Payer: Self-pay | Admitting: Family Medicine

## 2015-03-20 ENCOUNTER — Other Ambulatory Visit: Payer: Self-pay | Admitting: Family Medicine

## 2015-03-31 ENCOUNTER — Encounter: Payer: Self-pay | Admitting: Internal Medicine

## 2015-04-27 ENCOUNTER — Encounter: Payer: Self-pay | Admitting: Family Medicine

## 2015-04-27 ENCOUNTER — Ambulatory Visit (INDEPENDENT_AMBULATORY_CARE_PROVIDER_SITE_OTHER): Payer: Medicare HMO | Admitting: Family Medicine

## 2015-04-27 VITALS — BP 118/64 | HR 54 | Temp 98.2°F | Wt 194.5 lb

## 2015-04-27 DIAGNOSIS — M545 Low back pain, unspecified: Secondary | ICD-10-CM

## 2015-04-27 LAB — POCT URINALYSIS DIPSTICK
BILIRUBIN UA: NEGATIVE
Blood, UA: NEGATIVE
Glucose, UA: NEGATIVE
KETONES UA: NEGATIVE
LEUKOCYTES UA: NEGATIVE
Nitrite, UA: NEGATIVE
PH UA: 6
PROTEIN UA: NEGATIVE
SPEC GRAV UA: 1.015
Urobilinogen, UA: 0.2

## 2015-04-27 NOTE — Patient Instructions (Addendum)
I do think you have lower back ligament strain on right. Treat with aleve 2 tablets twice daily with food for 5 days. Let us know if not improving with this. Continue heating pad to back. Do exercises provided last time. Take breaks on your drive every 1hour or 1.5 hours.

## 2015-04-27 NOTE — Progress Notes (Signed)
BP 118/64 mmHg  Pulse 54  Temp(Src) 98.2 F (36.8 C) (Oral)  Wt 194 lb 8 oz (88.225 kg)   CC: R sided pain  Subjective:    Patient ID: Michael Decker, male    DOB: [birth year], 81 y.o.   MRN: [id number]  HPI: WINFORD HEHN is a 81 y.o. male presenting on 04/27/2015 for Back Pain   Intermittent lower back pain over last several years. Usually gets better after 2-3 weeks. Seen 8/2 with persistent back pain, declined xrays at that time. Treated for OA flare vs lumbar strain with tylenol 500mg , stretching/strengthening exercises. Back got better.   Now with "different problem" - right sided lower back pain that started 2d ago. Sharp pain worse with transitions "getting off couch" and improved with activity. He does go to [facility].   Has tried ice, heating pad, aleve (somewhat helpful).  Has tolerated prednisone ok (years ago). Upcoming trip this weekend to [place].  Denies inciting trauma/falls/injury. Denies shooting pain down legs, numbness, weakness, saddle anesthesia, fevers/chills, bowel/bladder accidents. No known h/o cancer. No UTI sxs, no hematuria.   Relevant past medical, surgical, family and social history reviewed and updated as indicated. Interim medical history since our last visit reviewed. Allergies and medications reviewed and updated. Current Outpatient Prescriptions on File Prior to Visit  Medication Sig  . aspirin EC 81 MG tablet Take 81 mg by mouth daily.  Marland Kitchen dutasteride (AVODART) 0.5 MG capsule Take 0.5 mg by mouth every other day.  . fish oil-omega-3 fatty acids 1000 MG capsule Take 1 g by mouth daily.   . metoprolol succinate (TOPROL-XL) 25 MG 24 hr tablet Take 0.5 tablets (12.5 mg total) by mouth daily.  . Multiple Vitamins-Minerals (MULTIVITAMIN WITH MINERALS) tablet Take 1 tablet by mouth daily.  Marland Kitchen omeprazole (PRILOSEC) 40 MG capsule Take 1 tab in the morning. (Patient taking differently: Take 40 mg by mouth every other day. Take 1 tab in the morning.)  .  telmisartan (MICARDIS) 80 MG tablet TAKE 1 TABLET BY MOUTH DAILY   No current facility-administered medications on file prior to visit.    Review of Systems Per HPI unless specifically indicated above     Objective:    BP 118/64 mmHg  Pulse 54  Temp(Src) 98.2 F (36.8 C) (Oral)  Wt 194 lb 8 oz (88.225 kg)  Wt Readings from Last 3 Encounters:  04/27/15 194 lb 8 oz (88.225 kg)  02/23/15 191 lb 12 oz (86.977 kg)  11/13/14 192 lb (87.091 kg)    Physical Exam  Constitutional: He is oriented to person, place, and time. He appears well-developed and well-nourished. No distress.  Able to easily move onto table without assistance  Musculoskeletal: He exhibits no edema.  No pain midline spine No paraspinous mm tenderness Neg SLR bilaterally. Neg FABER. No pain at SIJ, GTB or sciatic notch bilaterally.  Able to touch toes without exacerbation of pain  Neurological: He is alert and oriented to person, place, and time.  5/5 strength BLE Sensation intact  Skin: Skin is warm and dry. No rash noted.  Psychiatric: He has a normal mood and affect.  Nursing note and vitals reviewed.  Results for orders placed or performed in visit on 04/27/15  POCT Urinalysis Dipstick  Result Value Ref Range   Color, UA Straw    Clarity, UA Clear    Glucose, UA Negative    Bilirubin, UA Negative    Ketones, UA Negative    Spec Grav, UA 1.015  Blood, UA Negative    pH, UA 6.0    Protein, UA Negative    Urobilinogen, UA 0.2    Nitrite, UA Negative    Leukocytes, UA Negative Negative      Assessment & Plan:   Problem List Items Addressed This Visit    LBP (low back pain) - Primary    New location of pain. Anticipate lumbar strain. rec treat with aleve 440mg  BID with food for 5 days. Continue heating pad, exercises provided last visit. No red flags. Update if persistent pain past this.      Relevant Orders   POCT Urinalysis Dipstick (Completed)       Follow up plan: Return as needed.

## 2015-04-27 NOTE — Assessment & Plan Note (Signed)
New location of pain. Anticipate lumbar strain. rec treat with aleve 440mg  BID with food for 5 days. Continue heating pad, exercises provided last visit. No red flags. Update if persistent pain past this.

## 2015-04-27 NOTE — Progress Notes (Signed)
Pre visit review using our clinic review tool, if applicable. No additional management support is needed unless otherwise documented below in the visit note. 

## 2015-05-14 ENCOUNTER — Ambulatory Visit (INDEPENDENT_AMBULATORY_CARE_PROVIDER_SITE_OTHER): Payer: Medicare HMO

## 2015-05-14 DIAGNOSIS — Z23 Encounter for immunization: Secondary | ICD-10-CM

## 2015-06-19 ENCOUNTER — Other Ambulatory Visit: Payer: Self-pay | Admitting: Family Medicine

## 2015-06-25 ENCOUNTER — Other Ambulatory Visit: Payer: Self-pay | Admitting: Family Medicine

## 2015-06-25 ENCOUNTER — Other Ambulatory Visit (INDEPENDENT_AMBULATORY_CARE_PROVIDER_SITE_OTHER): Payer: Medicare HMO

## 2015-06-25 DIAGNOSIS — N400 Enlarged prostate without lower urinary tract symptoms: Secondary | ICD-10-CM

## 2015-06-25 DIAGNOSIS — I10 Essential (primary) hypertension: Secondary | ICD-10-CM

## 2015-06-25 DIAGNOSIS — E785 Hyperlipidemia, unspecified: Secondary | ICD-10-CM

## 2015-06-25 LAB — BASIC METABOLIC PANEL
BUN: 21 mg/dL (ref 6–23)
CALCIUM: 8.8 mg/dL (ref 8.4–10.5)
CO2: 30 meq/L (ref 19–32)
CREATININE: 1.1 mg/dL (ref 0.40–1.50)
Chloride: 104 mEq/L (ref 96–112)
GFR: 68.59 mL/min (ref >60.00)
Glucose, Bld: 92 mg/dL (ref 70–99)
Potassium: 4.5 mEq/L (ref 3.5–5.1)
SODIUM: 139 meq/L (ref 135–145)

## 2015-06-25 LAB — LIPID PANEL
CHOLESTEROL: 156 mg/dL (ref 0–200)
HDL: 42.3 mg/dL (ref >39.00)
LDL Cholesterol: 94 mg/dL (ref 0–99)
NonHDL: 113.98
TRIGLYCERIDES: 101 mg/dL (ref 0.0–149.0)
Total CHOL/HDL Ratio: 4
VLDL: 20.2 mg/dL (ref 0.0–40.0)

## 2015-06-25 LAB — PSA: PSA: 0.5 ng/mL (ref 0.10–4.00)

## 2015-07-02 ENCOUNTER — Ambulatory Visit (INDEPENDENT_AMBULATORY_CARE_PROVIDER_SITE_OTHER): Payer: Medicare HMO | Admitting: Family Medicine

## 2015-07-02 ENCOUNTER — Encounter: Payer: Self-pay | Admitting: Family Medicine

## 2015-07-02 VITALS — BP 136/78 | HR 52 | Temp 98.1°F | Ht 71.5 in | Wt 195.5 lb

## 2015-07-02 DIAGNOSIS — N400 Enlarged prostate without lower urinary tract symptoms: Secondary | ICD-10-CM

## 2015-07-02 DIAGNOSIS — K2100 Gastro-esophageal reflux disease with esophagitis, without bleeding: Secondary | ICD-10-CM

## 2015-07-02 DIAGNOSIS — Z0000 Encounter for general adult medical examination without abnormal findings: Secondary | ICD-10-CM | POA: Diagnosis not present

## 2015-07-02 DIAGNOSIS — E785 Hyperlipidemia, unspecified: Secondary | ICD-10-CM

## 2015-07-02 DIAGNOSIS — Z7189 Other specified counseling: Secondary | ICD-10-CM

## 2015-07-02 DIAGNOSIS — I10 Essential (primary) hypertension: Secondary | ICD-10-CM

## 2015-07-02 MED ORDER — TELMISARTAN 80 MG PO TABS: 80.0000 mg | ORAL_TABLET | Freq: Every day | ORAL | Status: DC

## 2015-07-02 NOTE — Assessment & Plan Note (Signed)

## 2015-07-02 NOTE — Assessment & Plan Note (Signed)
Chronic, stable. Will stop metoprolol due to bradycardia. Continue micardis 80mg  daily.

## 2015-07-02 NOTE — Assessment & Plan Note (Signed)
Discussed QOD PPI use.

## 2015-07-02 NOTE — Patient Instructions (Addendum)
Ok to stop metoprolol - monitor blood pressure and heart rate and if creeping up, restart.  Bring me copy of your living will to update chart Good to see you today, call us with quesitons. You are doing great today, return in 1 year for next medicare wellness visit, sooner if needed  Health Maintenance, Male A healthy lifestyle and preventative care can promote health and wellness.  Maintain regular health, dental, and eye exams.  Eat a healthy diet. Foods like vegetables, fruits, whole grains, low-fat dairy products, and lean protein foods contain the nutrients you need and are low in calories. Decrease your intake of foods high in solid fats, added sugars, and salt. Get information about a proper diet from your health care provider, if necessary.  Regular physical exercise is one of the most important things you can do for your health. Most adults should get at least 150 minutes of moderate-intensity exercise (any activity that increases your heart rate and causes you to sweat) each week. In addition, most adults need muscle-strengthening exercises on 2 or more days a week.   Maintain a healthy weight. The body mass index (BMI) is a screening tool to identify possible weight problems. It provides an estimate of body fat based on height and weight. Your health care provider can find your BMI and can help you achieve or maintain a healthy weight. For males 20 years and older:  A BMI below 18.5 is considered underweight.  A BMI of 18.5 to 24.9 is normal.  A BMI of 25 to 29.9 is considered overweight.  A BMI of 30 and above is considered obese.  Maintain normal blood lipids and cholesterol by exercising and minimizing your intake of saturated fat. Eat a balanced diet with plenty of fruits and vegetables. Blood tests for lipids and cholesterol should begin at age 71 and be repeated every 5 years. If your lipid or cholesterol levels are high, you are over age 73, or you are at high risk for  heart disease, you may need your cholesterol levels checked more frequently.Ongoing high lipid and cholesterol levels should be treated with medicines if diet and exercise are not working.  If you smoke, find out from your health care provider how to quit. If you do not use tobacco, do not start.  Lung cancer screening is recommended for adults aged 33-80 years who are at high risk for developing lung cancer because of a history of smoking. A yearly low-dose CT scan of the lungs is recommended for people who have at least a 30-pack-year history of smoking and are current smokers or have quit within the past 15 years. A pack year of smoking is smoking an average of 1 pack of cigarettes a day for 1 year (for example, a 30-pack-year history of smoking could mean smoking 1 pack a day for 30 years or 2 packs a day for 15 years). Yearly screening should continue until the smoker has stopped smoking for at least 15 years. Yearly screening should be stopped for people who develop a health problem that would prevent them from having lung cancer treatment.  If you choose to drink alcohol, do not have more than 2 drinks per day. One drink is considered to be 12 oz (360 mL) of beer, 5 oz (150 mL) of wine, or 1.5 oz (45 mL) of liquor.  Avoid the use of street drugs. Do not share needles with anyone. Ask for help if you need support or instructions about stopping the use  of drugs.  High blood pressure causes heart disease and increases the risk of stroke. High blood pressure is more likely to develop in:  People who have blood pressure in the end of the normal range (100-139/85-89 mm Hg).  People who are overweight or obese.  People who are African American.  If you are 62-49 years of age, have your blood pressure checked every 3-5 years. If you are 67 years of age or older, have your blood pressure checked every year. You should have your blood pressure measured twice--once when you are at a hospital or  clinic, and once when you are not at a hospital or clinic. Record the average of the two measurements. To check your blood pressure when you are not at a hospital or clinic, you can use:  An automated blood pressure machine at a pharmacy.  A home blood pressure monitor.  If you are 3-43 years old, ask your health care provider if you should take aspirin to prevent heart disease.  Diabetes screening involves taking a blood sample to check your fasting blood sugar level. This should be done once every 3 years after age 48 if you are at a normal weight and without risk factors for diabetes. Testing should be considered at a younger age or be carried out more frequently if you are overweight and have at least 1 risk factor for diabetes.  Colorectal cancer can be detected and often prevented. Most routine colorectal cancer screening begins at the age of 48 and continues through age 12. However, your health care provider may recommend screening at an earlier age if you have risk factors for colon cancer. On a yearly basis, your health care provider may provide home test kits to check for hidden blood in the stool. A small camera at the end of a tube may be used to directly examine the colon (sigmoidoscopy or colonoscopy) to detect the earliest forms of colorectal cancer. Talk to your health care provider about this at age 53 when routine screening begins. A direct exam of the colon should be repeated every 5-10 years through age 77, unless early forms of precancerous polyps or small growths are found.  People who are at an increased risk for hepatitis B should be screened for this virus. You are considered at high risk for hepatitis B if:  You were born in a country where hepatitis B occurs often. Talk with your health care provider about which countries are considered high risk.  Your parents were born in a high-risk country and you have not received a shot to protect against hepatitis B (hepatitis B  vaccine).  You have HIV or AIDS.  You use needles to inject street drugs.  You live with, or have sex with, someone who has hepatitis B.  You are a man who has sex with other men (MSM).  You get hemodialysis treatment.  You take certain medicines for conditions like cancer, organ transplantation, and autoimmune conditions.  Hepatitis C blood testing is recommended for all people born from [birth year] through 1965 and any individual with known risk factors for hepatitis C.  Healthy men should no longer receive prostate-specific antigen (PSA) blood tests as part of routine cancer screening. Talk to your health care provider about prostate cancer screening.  Testicular cancer screening is not recommended for adolescents or adult males who have no symptoms. Screening includes self-exam, a health care provider exam, and other screening tests. Consult with your health care provider about any symptoms you  have or any concerns you have about testicular cancer.  Practice safe sex. Use condoms and avoid high-risk sexual practices to reduce the spread of sexually transmitted infections (STIs).  You should be screened for STIs, including gonorrhea and chlamydia if:  You are sexually active and are younger than 24 years.  You are older than 24 years, and your health care provider tells you that you are at risk for this type of infection.  Your sexual activity has changed since you were last screened, and you are at an increased risk for chlamydia or gonorrhea. Ask your health care provider if you are at risk.  If you are at risk of being infected with HIV, it is recommended that you take a prescription medicine daily to prevent HIV infection. This is called pre-exposure prophylaxis (PrEP). You are considered at risk if:  You are a man who has sex with other men (MSM).  You are a heterosexual man who is sexually active with multiple partners.  You take drugs by injection.  You are sexually active  with a partner who has HIV.  Talk with your health care provider about whether you are at high risk of being infected with HIV. If you choose to begin PrEP, you should first be tested for HIV. You should then be tested every 3 months for as long as you are taking PrEP.  Use sunscreen. Apply sunscreen liberally and repeatedly throughout the day. You should seek shade when your shadow is shorter than you. Protect yourself by wearing long sleeves, pants, a wide-brimmed hat, and sunglasses year round whenever you are outdoors.  Tell your health care provider of new moles or changes in moles, especially if there is a change in shape or color. Also, tell your health care provider if a mole is larger than the size of a pencil eraser.  A one-time screening for abdominal aortic aneurysm (AAA) and surgical repair of large AAAs by ultrasound is recommended for men aged 60-75 years who are current or former smokers.  Stay current with your vaccines (immunizations).   This information is not intended to replace advice given to you by your health care provider. Make sure you discuss any questions you have with your health care provider.   Document Released: 01/06/2008 Document Revised: 07/31/2014 Document Reviewed: 12/05/2010 Elsevier Interactive Patient Education [employer].

## 2015-07-02 NOTE — Progress Notes (Signed)
Pre visit review using our clinic review tool, if applicable. No additional management support is needed unless otherwise documented below in the visit note. 

## 2015-07-02 NOTE — Assessment & Plan Note (Signed)
Followed by Dr Alinda Money. Continue dutasteride. PSA stable.

## 2015-07-02 NOTE — Assessment & Plan Note (Signed)
Advanced planning - has at home. Wife is HCPOA then daughter. Will bring me copy.

## 2015-07-02 NOTE — Progress Notes (Signed)
BP 136/78 mmHg  Pulse 52  Temp(Src) 98.1 F (36.7 C) (Oral)  Ht 5' 11.5" (1.816 m)  Wt 195 lb 8 oz (88.678 kg)  BMI 26.89 kg/m2   CC: medicare wellness visit  Subjective:    Patient ID: Michael Decker, male    DOB: 27-Dec-1933, 81 y.o.   MRN: [id number]  HPI: Michael Decker is a 81 y.o. male presenting on 07/02/2015 for Annual Exam   Hearing aides Vision screen with eye center No falls in last year No depression/anhedonia/sadness  Preventative: Colonoscopy - 01/2012 with Dr. Henrene Pastor 1 TA rec rpt 5 yrs Prostate - checked by Dr Alinda Money - h/o BPH. On avodart.  Flu 04/2015 Pneumovax 2002, prevnar 2015 Tdap 2010 zostavax 2013 Advanced planning - has at home. Wife is HCPOA then daughter. Will bring me copy. Seat belt use discussed Sunscreen use discussed. No changing moles on skin.  Lives with wife Occupation: prior worked with Clinical cytogeneticist as Recruitment consultant; volunteers at [employer] in [place]: BS Activity: works out at [employer] 3x/wk on recumbent bike Diet: some water, fruits/vegetables daily  Relevant past medical, surgical, family and social history reviewed and updated as indicated. Interim medical history since our last visit reviewed. Allergies and medications reviewed and updated. Current Outpatient Prescriptions on File Prior to Visit  Medication Sig  . aspirin EC 81 MG tablet Take 81 mg by mouth daily.  Marland Kitchen dutasteride (AVODART) 0.5 MG capsule Take 0.5 mg by mouth every other day.  . fish oil-omega-3 fatty acids 1000 MG capsule Take 1 g by mouth daily.   . Multiple Vitamins-Minerals (MULTIVITAMIN WITH MINERALS) tablet Take 1 tablet by mouth daily.   No current facility-administered medications on file prior to visit.    Review of Systems Per HPI unless specifically indicated in ROS section     Objective:    BP 136/78 mmHg  Pulse 52  Temp(Src) 98.1 F (36.7 C) (Oral)  Ht 5' 11.5" (1.816 m)  Wt 195 lb 8 oz (88.678 kg)  BMI 26.89 kg/m2  Wt  Readings from Last 3 Encounters:  07/02/15 195 lb 8 oz (88.678 kg)  04/27/15 194 lb 8 oz (88.225 kg)  02/23/15 191 lb 12 oz (86.977 kg)    Physical Exam  Constitutional: He is oriented to person, place, and time. He appears well-developed and well-nourished. No distress.  HENT:  Head: Normocephalic and atraumatic.  Right Ear: Hearing, tympanic membrane, external ear and ear canal normal.  Left Ear: Hearing, tympanic membrane, external ear and ear canal normal.  Nose: Nose normal.  Mouth/Throat: Uvula is midline, oropharynx is clear and moist and mucous membranes are normal. No oropharyngeal exudate, posterior oropharyngeal edema or posterior oropharyngeal erythema.  Eyes: Conjunctivae and EOM are normal. Pupils are equal, round, and reactive to light. No scleral icterus.  Neck: Normal range of motion. Neck supple. Carotid bruit is not present. No thyromegaly present.  Cardiovascular: Normal rate, regular rhythm, normal heart sounds and intact distal pulses.   No murmur heard. Pulses:      Radial pulses are 2+ on the right side, and 2+ on the left side.  Pulmonary/Chest: Effort normal and breath sounds normal. No respiratory distress. He has no wheezes. He has no rales.  Abdominal: Soft. Bowel sounds are normal. He exhibits no distension and no mass. There is no tenderness. There is no rebound and no guarding.  Musculoskeletal: Normal range of motion. He exhibits no edema.  Lymphadenopathy:    He has  no cervical adenopathy.  Neurological: He is alert and oriented to person, place, and time.  CN grossly intact, station and gait intact Recall 3/3 Calculation 5/5 serial 7s  Skin: Skin is warm and dry. No rash noted.  Psychiatric: He has a normal mood and affect. His behavior is normal. Judgment and thought content normal.  Nursing note and vitals reviewed.  Results for orders placed or performed in visit on 06/25/15  Lipid panel  Result Value Ref Range   Cholesterol 156 0 - 200 mg/dL     Triglycerides 101.0 0.0 - 149.0 mg/dL   HDL 42.30 >39.00 mg/dL   VLDL 20.2 0.0 - 40.0 mg/dL   LDL Cholesterol 94 0 - 99 mg/dL   Total CHOL/HDL Ratio 4    NonHDL 113.98   PSA  Result Value Ref Range   PSA 0.50 0.10 - 4.00 ng/mL  Basic metabolic panel  Result Value Ref Range   Sodium 139 135 - 145 mEq/L   Potassium 4.5 3.5 - 5.1 mEq/L   Chloride 104 96 - 112 mEq/L   CO2 30 19 - 32 mEq/L   Glucose, Bld 92 70 - 99 mg/dL   BUN 21 6 - 23 mg/dL   Creatinine, Ser 1.10 0.40 - 1.50 mg/dL   Calcium 8.8 8.4 - 10.5 mg/dL   GFR 68.59 >60.00 mL/min      Assessment & Plan:   Problem List Items Addressed This Visit    Medicare annual wellness visit, subsequent - Primary    I have personally reviewed the Medicare Annual Wellness questionnaire and have noted 1. The patient's medical and social history 2. Their use of alcohol, tobacco or illicit drugs 3. Their current medications and supplements 4. The patient's functional ability including ADL's, fall risks, home safety risks and hearing or visual impairment. Cognitive function has been assessed and addressed as indicated.  5. Diet and physical activity 6. Evidence for depression or mood disorders The patients weight, height, BMI have been recorded in the chart. I have made referrals, counseling and provided education to the patient based on review of the above and I have provided the pt with a written personalized care plan for preventive services. Provider list updated.. See scanned questionairre as needed for further documentation. Reviewed preventative protocols and updated unless pt declined.       HTN (hypertension)    Chronic, stable. Will stop metoprolol due to bradycardia. Continue micardis 80mg  daily.      Relevant Medications   telmisartan (MICARDIS) 80 MG tablet   HLD (hyperlipidemia)    Chronic, stable. Continue fish oil.       Relevant Medications   telmisartan (MICARDIS) 80 MG tablet   GERD (gastroesophageal reflux  disease)    Discussed QOD PPI use.      Relevant Medications   omeprazole (PRILOSEC) 40 MG capsule   BPH (benign prostatic hypertrophy)    Followed by Dr Alinda Money. Continue dutasteride. PSA stable.      Advanced care planning/counseling discussion    Advanced planning - has at home. Wife is HCPOA then daughter. Will bring me copy.          Follow up plan: Return in about 1 year (around 07/01/2016), or as needed, for medicare wellness visit.

## 2015-07-02 NOTE — Assessment & Plan Note (Signed)
Chronic, stable. Continue fish oil.

## 2015-08-18 ENCOUNTER — Ambulatory Visit (INDEPENDENT_AMBULATORY_CARE_PROVIDER_SITE_OTHER): Payer: PPO | Admitting: Internal Medicine

## 2015-08-18 ENCOUNTER — Encounter: Payer: Self-pay | Admitting: Internal Medicine

## 2015-08-18 VITALS — BP 134/70 | HR 66 | Temp 98.1°F | Wt 193.5 lb

## 2015-08-18 DIAGNOSIS — M542 Cervicalgia: Secondary | ICD-10-CM | POA: Insufficient documentation

## 2015-08-18 MED ORDER — TIZANIDINE HCL 2 MG PO TABS: 2.0000 mg | ORAL_TABLET | Freq: Every evening | ORAL | Status: DC | PRN

## 2015-08-18 NOTE — Patient Instructions (Signed)
Please try aleve--- 1-2 tabs twice a day till your neck is better. Use a heat wrap and try the muscle relaxer if the pain is still bad at night

## 2015-08-18 NOTE — Progress Notes (Signed)
Pre visit review using our clinic review tool, if applicable. No additional management support is needed unless otherwise documented below in the visit note. 

## 2015-08-18 NOTE — Progress Notes (Signed)
Subjective:    Patient ID: Michael Decker, male    DOB: 1933/11/30, 82 y.o.   MRN: [id number]  HPI Here due to neck pain  5 days ago--went to bed Awoke with bad neck pain at 2:30AM Felt severe pain on left side---which improved a little Went to couch to sleep and able to get back to sleep a little Still sore 4 days ago but did okay Hurt really bad again that night  Tried aspirin (650mg  up to several times per day) and heat Then tried soft cervical brace--may have helped some Still had bad pain at night  Doesn't remember any injury Still affecting him in bed--careful to roll out of bed now No pain now but aches if he moves it  Current Outpatient Prescriptions on File Prior to Visit  Medication Sig Dispense Refill  . aspirin EC 81 MG tablet Take 81 mg by mouth daily.    Marland Kitchen dutasteride (AVODART) 0.5 MG capsule Take 0.5 mg by mouth every other day.    . fish oil-omega-3 fatty acids 1000 MG capsule Take 1 g by mouth daily.     . Multiple Vitamins-Minerals (MULTIVITAMIN WITH MINERALS) tablet Take 1 tablet by mouth daily.    Marland Kitchen omeprazole (PRILOSEC) 40 MG capsule Take 1 capsule (40 mg total) by mouth every other day.    . telmisartan (MICARDIS) 80 MG tablet Take 1 tablet (80 mg total) by mouth daily. 90 tablet 3   No current facility-administered medications on file prior to visit.    No Known Allergies  Past Medical History  Diagnosis Date  . Colon polyps 2013    TA 2013 Henrene Pastor)  . Diverticulosis   . GERD (gastroesophageal reflux disease)   . HTN (hypertension)   . BPH (benign prostatic hypertrophy)     on avodart, released from urologist care  . HLD (hyperlipidemia)     mild  . Hematospermia     s/p uro eval    Past Surgical History  Procedure Laterality Date  . Colonoscopy  2008  . Bunionectomy Right 2014  . Tonsillectomy  1944  . Colonoscopy  01/2012    TA x1, diverticulosis, rpt 5 yrs Henrene Pastor)  . Cystectomy    . Upper gi endoscopy      Family History    Problem Relation Age of Onset  . Sudden death Father 5    ?MI  . Heart disease Brother 74    ?afib  . Cancer Neg Hx   . Diabetes Neg Hx   . Stroke Neg Hx   . Colon cancer Neg Hx   . Esophageal cancer Neg Hx   . Stomach cancer Neg Hx     Social History   Social History  . Marital Status: Married    Spouse Name: N/A  . Number of Children: N/A  . Years of Education: N/A   Occupational History  . Not on file.   Social History Main Topics  . Smoking status: Former Research scientist (life sciences)  . Smokeless tobacco: Never Used     Comment: cigars back in the 60's  . Alcohol Use: 4.2 oz/week    7 Cans of beer per week     Comment: one beer a night (12 oz)  . Drug Use: No  . Sexual Activity:    Partners: Female   Other Topics Concern  . Not on file   Social History Narrative   Lives with wife   Grown children   Occupation: prior worked with Clinical cytogeneticist  as Recruitment consultant; volunteers at [employer] in [place]   Edu: BS   Activity: works out at [employer] 3x/wk, on recumbent bike   Diet: some water, fruits/vegetables daily            Review of Systems No arm or hand weakness No fever No sore throat    Objective:   Physical Exam  Constitutional: He appears well-developed and well-nourished. No distress.  HENT:  Mouth/Throat: Oropharynx is clear and moist. No oropharyngeal exudate.  Neck: No thyromegaly present.  Some tenderness over left SCM--both muscle bodies Some stiffness in both traps Pain with full extension Restriction in tilt and some in rotation but flexion fairly normal  Lymphadenopathy:    He has no cervical adenopathy.          Assessment & Plan:

## 2015-08-18 NOTE — Assessment & Plan Note (Signed)
Having spasm No worrisome features Discussed brief course of NSAIDs (stop the aspirin), heat Will Rx tizanidine for night

## 2015-09-16 ENCOUNTER — Telehealth: Payer: Self-pay | Admitting: Internal Medicine

## 2015-09-16 NOTE — Telephone Encounter (Signed)
Discussed with pt that any PPI can have potential side effects and that he might try zantac otc or continue taking tums as needed. Pt states he thinks he will try to stick with tums. Pt knows to call back if he has any other concerns.

## 2015-12-23 DIAGNOSIS — L03019 Cellulitis of unspecified finger: Secondary | ICD-10-CM | POA: Diagnosis not present

## 2015-12-23 DIAGNOSIS — Z85828 Personal history of other malignant neoplasm of skin: Secondary | ICD-10-CM | POA: Diagnosis not present

## 2015-12-23 DIAGNOSIS — L301 Dyshidrosis [pompholyx]: Secondary | ICD-10-CM | POA: Diagnosis not present

## 2015-12-23 DIAGNOSIS — L821 Other seborrheic keratosis: Secondary | ICD-10-CM | POA: Diagnosis not present

## 2015-12-23 DIAGNOSIS — L814 Other melanin hyperpigmentation: Secondary | ICD-10-CM | POA: Diagnosis not present

## 2015-12-29 DIAGNOSIS — M722 Plantar fascial fibromatosis: Secondary | ICD-10-CM | POA: Diagnosis not present

## 2015-12-29 DIAGNOSIS — M7752 Other enthesopathy of left foot: Secondary | ICD-10-CM | POA: Diagnosis not present

## 2015-12-29 DIAGNOSIS — M2042 Other hammer toe(s) (acquired), left foot: Secondary | ICD-10-CM | POA: Diagnosis not present

## 2015-12-29 DIAGNOSIS — M7751 Other enthesopathy of right foot: Secondary | ICD-10-CM | POA: Diagnosis not present

## 2015-12-29 DIAGNOSIS — M2041 Other hammer toe(s) (acquired), right foot: Secondary | ICD-10-CM | POA: Diagnosis not present

## 2016-01-07 DIAGNOSIS — L570 Actinic keratosis: Secondary | ICD-10-CM | POA: Diagnosis not present

## 2016-01-11 DIAGNOSIS — M722 Plantar fascial fibromatosis: Secondary | ICD-10-CM | POA: Diagnosis not present

## 2016-04-02 ENCOUNTER — Other Ambulatory Visit: Payer: Self-pay | Admitting: Physician Assistant

## 2016-04-20 DIAGNOSIS — L301 Dyshidrosis [pompholyx]: Secondary | ICD-10-CM | POA: Diagnosis not present

## 2016-04-20 DIAGNOSIS — L820 Inflamed seborrheic keratosis: Secondary | ICD-10-CM | POA: Diagnosis not present

## 2016-04-20 DIAGNOSIS — L821 Other seborrheic keratosis: Secondary | ICD-10-CM | POA: Diagnosis not present

## 2016-05-02 ENCOUNTER — Ambulatory Visit (INDEPENDENT_AMBULATORY_CARE_PROVIDER_SITE_OTHER): Payer: PPO

## 2016-05-02 DIAGNOSIS — Z23 Encounter for immunization: Secondary | ICD-10-CM | POA: Diagnosis not present

## 2016-06-25 ENCOUNTER — Other Ambulatory Visit: Payer: Self-pay | Admitting: Family Medicine

## 2016-06-25 DIAGNOSIS — N400 Enlarged prostate without lower urinary tract symptoms: Secondary | ICD-10-CM

## 2016-06-25 DIAGNOSIS — E785 Hyperlipidemia, unspecified: Secondary | ICD-10-CM

## 2016-06-27 ENCOUNTER — Other Ambulatory Visit: Payer: PPO

## 2016-06-28 ENCOUNTER — Ambulatory Visit (INDEPENDENT_AMBULATORY_CARE_PROVIDER_SITE_OTHER): Payer: PPO

## 2016-06-28 ENCOUNTER — Other Ambulatory Visit: Payer: PPO

## 2016-06-28 VITALS — BP 132/70 | HR 51 | Temp 98.1°F | Ht 70.5 in | Wt 186.5 lb

## 2016-06-28 DIAGNOSIS — E785 Hyperlipidemia, unspecified: Secondary | ICD-10-CM | POA: Diagnosis not present

## 2016-06-28 DIAGNOSIS — Z125 Encounter for screening for malignant neoplasm of prostate: Secondary | ICD-10-CM

## 2016-06-28 DIAGNOSIS — Z0000 Encounter for general adult medical examination without abnormal findings: Secondary | ICD-10-CM

## 2016-06-28 DIAGNOSIS — Z23 Encounter for immunization: Secondary | ICD-10-CM

## 2016-06-28 LAB — BASIC METABOLIC PANEL WITH GFR
BUN: 19 mg/dL (ref 6–23)
CO2: 29 meq/L (ref 19–32)
Calcium: 9.1 mg/dL (ref 8.4–10.5)
Chloride: 105 meq/L (ref 96–112)
Creatinine, Ser: 1.12 mg/dL (ref 0.40–1.50)
GFR: 67.01 mL/min
Glucose, Bld: 92 mg/dL (ref 70–99)
Potassium: 4.6 meq/L (ref 3.5–5.1)
Sodium: 141 meq/L (ref 135–145)

## 2016-06-28 LAB — LIPID PANEL
Cholesterol: 185 mg/dL (ref 0–200)
HDL: 61.5 mg/dL (ref >39.00)
LDL Cholesterol: 105 mg/dL — ABNORMAL HIGH (ref 0–99)
NonHDL: 123.93
TRIGLYCERIDES: 96 mg/dL (ref 0.0–149.0)
Total CHOL/HDL Ratio: 3
VLDL: 19.2 mg/dL (ref 0.0–40.0)

## 2016-06-28 LAB — PSA, MEDICARE: PSA: 0.57 ng/mL (ref 0.10–4.00)

## 2016-06-28 NOTE — Progress Notes (Signed)
PCP notes:   Health maintenance:  PPSV23 - administered  Abnormal screenings:   Mini-Cog score: 19  Patient concerns:   Pt states he is no longer seeing urologist. Pt states urologist advised him to get Avodart prescription from PCP in the future.   Nurse concerns:  None  Next PCP appt:   07/06/16 @ 1645

## 2016-06-28 NOTE — Progress Notes (Signed)
Subjective:   Michael Decker is a 82 y.o. male who presents for Medicare Annual/Subsequent preventive examination.  Review of Systems:  N/A Cardiac Risk Factors include: advanced age (>41men, >38 women);male gender;hypertension;dyslipidemia     Objective:    Vitals: BP 132/70 (BP Location: Left Arm, Patient Position: Sitting, Cuff Size: Normal)   Pulse (!) 51   Temp 98.1 F (36.7 C) (Oral)   Ht 5' 10.5" (1.791 m) Comment: no shoes  Wt 186 lb 8 oz (84.6 kg)   SpO2 97%   BMI 26.38 kg/m   Body mass index is 26.38 kg/m.  Tobacco History  Smoking Status  . Former Smoker  Smokeless Tobacco  . Never Used    Comment: cigars back in the 60's     Counseling given: No   Past Medical History:  Diagnosis Date  . BPH (benign prostatic hypertrophy)    on avodart, released from urologist care  . Colon polyps 2013   TA 2013 Henrene Pastor)  . Diverticulosis   . GERD (gastroesophageal reflux disease)   . Hematospermia    s/p uro eval  . HLD (hyperlipidemia)    mild  . HTN (hypertension)    Past Surgical History:  Procedure Laterality Date  . BUNIONECTOMY Right 2014  . COLONOSCOPY  2008  . COLONOSCOPY  01/2012   TA x1, diverticulosis, rpt 5 yrs Henrene Pastor)  . CYSTECTOMY    . TONSILLECTOMY  1944  . UPPER GI ENDOSCOPY     Family History  Problem Relation Age of Onset  . Sudden death Father 67    ?MI  . Heart disease Brother 36    ?afib  . Cancer Neg Hx   . Diabetes Neg Hx   . Stroke Neg Hx   . Colon cancer Neg Hx   . Esophageal cancer Neg Hx   . Stomach cancer Neg Hx    History  Sexual Activity  . Sexual activity: Yes  . Partners: Female    Outpatient Encounter Prescriptions as of 06/28/2016  Medication Sig  . aspirin EC 81 MG tablet Take 81 mg by mouth daily.  Marland Kitchen dutasteride (AVODART) 0.5 MG capsule Take 0.5 mg by mouth every other day.  . fish oil-omega-3 fatty acids 1000 MG capsule Take 1 g by mouth daily.   . Multiple Vitamins-Minerals (MULTIVITAMIN WITH MINERALS)  tablet Take 1 tablet by mouth daily.  Marland Kitchen omeprazole (PRILOSEC) 40 MG capsule TAKE 1 TABLET BY MOUTH EVERY MORNING  . telmisartan (MICARDIS) 80 MG tablet Take 1 tablet (80 mg total) by mouth daily.  . [DISCONTINUED] omeprazole (PRILOSEC) 40 MG capsule Take 1 capsule (40 mg total) by mouth every other day.  . [DISCONTINUED] tiZANidine (ZANAFLEX) 2 MG tablet Take 1-2 tablets (2-4 mg total) by mouth at bedtime as needed for muscle spasms.   No facility-administered encounter medications on file as of 06/28/2016.     Activities of Daily Living In your present state of health, do you have any difficulty performing the following activities: 06/28/2016  Hearing? Y  Vision? Y  Difficulty concentrating or making decisions? Y  Walking or climbing stairs? N  Dressing or bathing? N  Doing errands, shopping? N  Preparing Food and eating ? N  Using the Toilet? N  In the past six months, have you accidently leaked urine? N  Do you have problems with loss of bowel control? N  Managing your Medications? N  Managing your Finances? N  Housekeeping or managing your Housekeeping? N  Some recent data  might be hidden    Patient Care Team: Ria Bush, MD as PCP - General (Family Medicine) Druscilla Brownie, MD as Consulting Physician (Dermatology) Raynelle Bring, MD as Consulting Physician (Urology) Albertine Patricia, DPM as Attending Physician (Podiatry) Leandrew Koyanagi, MD as Referring Physician (Ophthalmology) Irene Shipper, MD as Consulting Physician (Gastroenterology)   Assessment:    Hearing Screening Comments: Wears hearing aids Vision Screening Comments: Future exam in Dec 2017 with Dr. Wallace Going  Exercise Activities and Dietary recommendations Current Exercise Habits: Home exercise routine, Type of exercise: strength training/weights;Other - see comments, Time (Minutes): 60 (60 -90 min), Frequency (Times/Week): 3, Weekly Exercise (Minutes/Week): 180, Intensity: Moderate, Exercise limited  by: None identified  Goals    . Increase physical activity          Starting 06/28/2016, I will continue to exercise 30-90 min 3 days per week.       Fall Risk Fall Risk  06/28/2016 07/02/2015 06/23/2014  Falls in the past year? No No No   Depression Screen PHQ 2/9 Scores 06/28/2016 07/02/2015 06/23/2014  PHQ - 2 Score 0 0 0    Cognitive Function MMSE - Mini Mental State Exam 06/28/2016  Orientation to time 5  Orientation to Place 5  Registration 3  Attention/ Calculation 0  Recall 2  Recall-comments pt was unable to recall 1 of 3 words  Language- name 2 objects 0  Language- repeat 1  Language- follow 3 step command 3  Language- read & follow direction 0  Write a sentence 0  Copy design 0  Total score 19     PLEASE NOTE: A Mini-Cog screen was completed. Maximum score is 20. A value of 0 denotes this part of Folstein MMSE was not completed or the patient failed this part of the Mini-Cog screening.   Mini-Cog Screening Orientation to Time - Max 5 pts Orientation to Place - Max 5 pts Registration - Max 3 pts Recall - Max 3 pts Language Repeat - Max 1 pts Language Follow 3 Step Command - Max 3 pts     Immunization History  Administered Date(s) Administered  . Influenza,inj,Quad PF,36+ Mos 04/29/2014, 05/14/2015, 05/02/2016  . Influenza-Unspecified 05/09/2013  . Pneumococcal Conjugate-13 06/23/2014  . Pneumococcal Polysaccharide-23 03/24/2001, 06/28/2016  . Tdap 05/11/2009  . Zoster 06/12/2012   Screening Tests Health Maintenance  Topic Date Due  . COLONOSCOPY  02/11/2017  . DTaP/Tdap/Td (2 - Td) 05/12/2019  . TETANUS/TDAP  05/12/2019  . INFLUENZA VACCINE  Completed  . ZOSTAVAX  Completed  . PNA vac Low Risk Adult  Completed      Plan:     I have personally reviewed and addressed the Medicare Annual Wellness questionnaire and have noted the following in the patient's chart:  A. Medical and social history B. Use of alcohol, tobacco or illicit drugs  C. Current  medications and supplements D. Functional ability and status E.  Nutritional status F.  Physical activity G. Advance directives H. List of other physicians I.  Hospitalizations, surgeries, and ER visits in previous 12 months J.  [place] to include hearing, vision, cognitive, depression L. Referrals and appointments - none  In addition, I have reviewed and discussed with patient certain preventive protocols, quality metrics, and best practice recommendations. A written personalized care plan for preventive services as well as general preventive health recommendations were provided to patient.  See attached scanned questionnaire for additional information.   Signed,   Lindell Noe, MHA, BS, LPN Health Coach

## 2016-06-28 NOTE — Patient Instructions (Signed)
Michael Decker , Thank you for taking time to come for your Medicare Wellness Visit. I appreciate your ongoing commitment to your health goals. Please review the following plan we discussed and let me know if I can assist you in the future.   These are the goals we discussed: Goals    . Increase physical activity          Starting 06/28/2016, I will continue to exercise 30-90 min 3 days per week.        This is a list of the screening recommended for you and due dates:  Health Maintenance  Topic Date Due  . Colon Cancer Screening  02/11/2017  . DTaP/Tdap/Td vaccine (2 - Td) 05/12/2019  . Tetanus Vaccine  05/12/2019  . Flu Shot  Completed  . Shingles Vaccine  Completed  . Pneumonia vaccines  Completed   Preventive Care for Adults  A healthy lifestyle and preventive care can promote health and wellness. Preventive health guidelines for adults include the following key practices.  . A routine yearly physical is a good way to check with your health care provider about your health and preventive screening. It is a chance to share any concerns and updates on your health and to receive a thorough exam.  . Visit your dentist for a routine exam and preventive care every 6 months. Brush your teeth twice a day and floss once a day. Good oral hygiene prevents tooth decay and gum disease.  . The frequency of eye exams is based on your age, health, family medical history, use  of contact lenses, and other factors. Follow your health care provider's ecommendations for frequency of eye exams.  . Eat a healthy diet. Foods like vegetables, fruits, whole grains, low-fat dairy products, and lean protein foods contain the nutrients you need without too many calories. Decrease your intake of foods high in solid fats, added sugars, and salt. Eat the right amount of calories for you. Get information about a proper diet from your health care provider, if necessary.  . Regular physical exercise is one of the  most important things you can do for your health. Most adults should get at least 150 minutes of moderate-intensity exercise (any activity that increases your heart rate and causes you to sweat) each week. In addition, most adults need muscle-strengthening exercises on 2 or more days a week.  Silver Sneakers may be a benefit available to you. To determine eligibility, you may visit the website: www.silversneakers.com or contact program at 760-412-3969 Mon-Fri between 8AM-8PM.   . Maintain a healthy weight. The body mass index (BMI) is a screening tool to identify possible weight problems. It provides an estimate of body fat based on height and weight. Your health care provider can find your BMI and can help you achieve or maintain a healthy weight.   For adults 20 years and older: ? A BMI below 18.5 is considered underweight. ? A BMI of 18.5 to 24.9 is normal. ? A BMI of 25 to 29.9 is considered overweight. ? A BMI of 30 and above is considered obese.   . Maintain normal blood lipids and cholesterol levels by exercising and minimizing your intake of saturated fat. Eat a balanced diet with plenty of fruit and vegetables. Blood tests for lipids and cholesterol should begin at age 46 and be repeated every 5 years. If your lipid or cholesterol levels are high, you are over 50, or you are at high risk for heart disease, you may need  your cholesterol levels checked more frequently. Ongoing high lipid and cholesterol levels should be treated with medicines if diet and exercise are not working.  . If you smoke, find out from your health care provider how to quit. If you do not use tobacco, please do not start.  . If you choose to drink alcohol, please do not consume more than 2 drinks per day. One drink is considered to be 12 ounces (355 mL) of beer, 5 ounces (148 mL) of wine, or 1.5 ounces (44 mL) of liquor.  . If you are 77-61 years old, ask your health care provider if you should take aspirin to  prevent strokes.  . Use sunscreen. Apply sunscreen liberally and repeatedly throughout the day. You should seek shade when your shadow is shorter than you. Protect yourself by wearing long sleeves, pants, a wide-brimmed hat, and sunglasses year round, whenever you are outdoors.  . Once a month, do a whole body skin exam, using a mirror to look at the skin on your back. Tell your health care provider of new moles, moles that have irregular borders, moles that are larger than a pencil eraser, or moles that have changed in shape or color.

## 2016-06-28 NOTE — Progress Notes (Signed)
Pre visit review using our clinic review tool, if applicable. No additional management support is needed unless otherwise documented below in the visit note. 

## 2016-06-28 NOTE — Progress Notes (Signed)
   Subjective:    Patient ID: Michael Decker, male    DOB: 12/02/1933, 82 y.o.   MRN: [id number]  HPI I reviewed health advisor's note, was available for consultation, and agree with documentation and plan.    Review of Systems     Objective:   Physical Exam        Assessment & Plan:

## 2016-07-04 ENCOUNTER — Encounter: Payer: PPO | Admitting: Family Medicine

## 2016-07-06 ENCOUNTER — Ambulatory Visit (INDEPENDENT_AMBULATORY_CARE_PROVIDER_SITE_OTHER): Payer: PPO | Admitting: Family Medicine

## 2016-07-06 ENCOUNTER — Encounter: Payer: Self-pay | Admitting: Family Medicine

## 2016-07-06 VITALS — BP 114/64 | HR 64 | Temp 98.5°F | Wt 193.5 lb

## 2016-07-06 DIAGNOSIS — E785 Hyperlipidemia, unspecified: Secondary | ICD-10-CM

## 2016-07-06 DIAGNOSIS — Z0000 Encounter for general adult medical examination without abnormal findings: Secondary | ICD-10-CM | POA: Insufficient documentation

## 2016-07-06 DIAGNOSIS — K2100 Gastro-esophageal reflux disease with esophagitis, without bleeding: Secondary | ICD-10-CM

## 2016-07-06 DIAGNOSIS — N400 Enlarged prostate without lower urinary tract symptoms: Secondary | ICD-10-CM

## 2016-07-06 DIAGNOSIS — R252 Cramp and spasm: Secondary | ICD-10-CM | POA: Insufficient documentation

## 2016-07-06 DIAGNOSIS — H2513 Age-related nuclear cataract, bilateral: Secondary | ICD-10-CM | POA: Diagnosis not present

## 2016-07-06 DIAGNOSIS — I10 Essential (primary) hypertension: Secondary | ICD-10-CM | POA: Diagnosis not present

## 2016-07-06 DIAGNOSIS — Z0001 Encounter for general adult medical examination with abnormal findings: Secondary | ICD-10-CM | POA: Insufficient documentation

## 2016-07-06 DIAGNOSIS — Z7189 Other specified counseling: Secondary | ICD-10-CM | POA: Diagnosis not present

## 2016-07-06 LAB — POC URINALSYSI DIPSTICK (AUTOMATED)
Bilirubin, UA: NEGATIVE
Blood, UA: NEGATIVE
Glucose, UA: NEGATIVE
KETONES UA: NEGATIVE
LEUKOCYTES UA: NEGATIVE
NITRITE UA: NEGATIVE
PH UA: 6
PROTEIN UA: NEGATIVE
Spec Grav, UA: >=1.03
Urobilinogen, UA: 0.2

## 2016-07-06 MED ORDER — TELMISARTAN 80 MG PO TABS: 80.0000 mg | ORAL_TABLET | Freq: Every day | ORAL | 3 refills | Status: DC

## 2016-07-06 MED ORDER — DUTASTERIDE 0.5 MG PO CAPS: 0.5000 mg | ORAL_CAPSULE | ORAL | 3 refills | Status: DC

## 2016-07-06 MED ORDER — OMEPRAZOLE 40 MG PO CPDR: 40.0000 mg | DELAYED_RELEASE_CAPSULE | Freq: Every day | ORAL | 6 refills | Status: DC | PRN

## 2016-07-06 NOTE — Assessment & Plan Note (Signed)
Preventative protocols reviewed and updated unless pt declined. Discussed healthy diet and lifestyle.  

## 2016-07-06 NOTE — Assessment & Plan Note (Signed)
Right leg - suggested trial daily magnesium.

## 2016-07-06 NOTE — Assessment & Plan Note (Signed)
Controlled with Q3d PPI

## 2016-07-06 NOTE — Assessment & Plan Note (Signed)
DRE/PSA stable. UA today. Dutasteride refilled.

## 2016-07-06 NOTE — Patient Instructions (Addendum)
Urinalysis today.  You are doing well today.  Try daily magnesium for cramping.   Health Maintenance, Male A healthy lifestyle and preventative care can promote health and wellness.  Maintain regular health, dental, and eye exams.  Eat a healthy diet. Foods like vegetables, fruits, whole grains, low-fat dairy products, and lean protein foods contain the nutrients you need and are low in calories. Decrease your intake of foods high in solid fats, added sugars, and salt. Get information about a proper diet from your health care provider, if necessary.  Regular physical exercise is one of the most important things you can do for your health. Most adults should get at least 150 minutes of moderate-intensity exercise (any activity that increases your heart rate and causes you to sweat) each week. In addition, most adults need muscle-strengthening exercises on 2 or more days a week.   Maintain a healthy weight. The body mass index (BMI) is a screening tool to identify possible weight problems. It provides an estimate of body fat based on height and weight. Your health care provider can find your BMI and can help you achieve or maintain a healthy weight. For males 20 years and older:  A BMI below 18.5 is considered underweight.  A BMI of 18.5 to 24.9 is normal.  A BMI of 25 to 29.9 is considered overweight.  A BMI of 30 and above is considered obese.  Maintain normal blood lipids and cholesterol by exercising and minimizing your intake of saturated fat. Eat a balanced diet with plenty of fruits and vegetables. Blood tests for lipids and cholesterol should begin at age 62 and be repeated every 5 years. If your lipid or cholesterol levels are high, you are over age 49, or you are at high risk for heart disease, you may need your cholesterol levels checked more frequently.Ongoing high lipid and cholesterol levels should be treated with medicines if diet and exercise are not working.  If you smoke,  find out from your health care provider how to quit. If you do not use tobacco, do not start.  Lung cancer screening is recommended for adults aged 87-80 years who are at high risk for developing lung cancer because of a history of smoking. A yearly low-dose CT scan of the lungs is recommended for people who have at least a 30-pack-year history of smoking and are current smokers or have quit within the past 15 years. A pack year of smoking is smoking an average of 1 pack of cigarettes a day for 1 year (for example, a 30-pack-year history of smoking could mean smoking 1 pack a day for 30 years or 2 packs a day for 15 years). Yearly screening should continue until the smoker has stopped smoking for at least 15 years. Yearly screening should be stopped for people who develop a health problem that would prevent them from having lung cancer treatment.  If you choose to drink alcohol, do not have more than 2 drinks per day. One drink is considered to be 12 oz (360 mL) of beer, 5 oz (150 mL) of wine, or 1.5 oz (45 mL) of liquor.  Avoid the use of street drugs. Do not share needles with anyone. Ask for help if you need support or instructions about stopping the use of drugs.  High blood pressure causes heart disease and increases the risk of stroke. High blood pressure is more likely to develop in:  People who have blood pressure in the end of the normal range (100-139/85-89  mm Hg).  People who are overweight or obese.  People who are African American.  If you are 18-58 years of age, have your blood pressure checked every 3-5 years. If you are 51 years of age or older, have your blood pressure checked every year. You should have your blood pressure measured twice-once when you are at a hospital or clinic, and once when you are not at a hospital or clinic. Record the average of the two measurements. To check your blood pressure when you are not at a hospital or clinic, you can use:  An automated blood  pressure machine at a pharmacy.  A home blood pressure monitor.  If you are 51-45 years old, ask your health care provider if you should take aspirin to prevent heart disease.  Diabetes screening involves taking a blood sample to check your fasting blood sugar level. This should be done once every 3 years after age 60 if you are at a normal weight and without risk factors for diabetes. Testing should be considered at a younger age or be carried out more frequently if you are overweight and have at least 1 risk factor for diabetes.  Colorectal cancer can be detected and often prevented. Most routine colorectal cancer screening begins at the age of 16 and continues through age 98. However, your health care provider may recommend screening at an earlier age if you have risk factors for colon cancer. On a yearly basis, your health care provider may provide home test kits to check for hidden blood in the stool. A small camera at the end of a tube may be used to directly examine the colon (sigmoidoscopy or colonoscopy) to detect the earliest forms of colorectal cancer. Talk to your health care provider about this at age 21 when routine screening begins. A direct exam of the colon should be repeated every 5-10 years through age 64, unless early forms of precancerous polyps or small growths are found.  People who are at an increased risk for hepatitis B should be screened for this virus. You are considered at high risk for hepatitis B if:  You were born in a country where hepatitis B occurs often. Talk with your health care provider about which countries are considered high risk.  Your parents were born in a high-risk country and you have not received a shot to protect against hepatitis B (hepatitis B vaccine).  You have HIV or AIDS.  You use needles to inject street drugs.  You live with, or have sex with, someone who has hepatitis B.  You are a man who has sex with other men (MSM).  You get  hemodialysis treatment.  You take certain medicines for conditions like cancer, organ transplantation, and autoimmune conditions.  Hepatitis C blood testing is recommended for all people born from [birth year] through 1963 and any individual with known risk factors for hepatitis C.  Healthy men should no longer receive prostate-specific antigen (PSA) blood tests as part of routine cancer screening. Talk to your health care provider about prostate cancer screening.  Testicular cancer screening is not recommended for adolescents or adult males who have no symptoms. Screening includes self-exam, a health care provider exam, and other screening tests. Consult with your health care provider about any symptoms you have or any concerns you have about testicular cancer.  Practice safe sex. Use condoms and avoid high-risk sexual practices to reduce the spread of sexually transmitted infections (STIs).  You should be screened for STIs, including gonorrhea  and chlamydia if:  You are sexually active and are younger than 24 years.  You are older than 24 years, and your health care provider tells you that you are at risk for this type of infection.  Your sexual activity has changed since you were last screened, and you are at an increased risk for chlamydia or gonorrhea. Ask your health care provider if you are at risk.  If you are at risk of being infected with HIV, it is recommended that you take a prescription medicine daily to prevent HIV infection. This is called pre-exposure prophylaxis (PrEP). You are considered at risk if:  You are a man who has sex with other men (MSM).  You are a heterosexual man who is sexually active with multiple partners.  You take drugs by injection.  You are sexually active with a partner who has HIV.  Talk with your health care provider about whether you are at high risk of being infected with HIV. If you choose to begin PrEP, you should first be tested for HIV. You should  then be tested every 3 months for as long as you are taking PrEP.  Use sunscreen. Apply sunscreen liberally and repeatedly throughout the day. You should seek shade when your shadow is shorter than you. Protect yourself by wearing long sleeves, pants, a wide-brimmed hat, and sunglasses year round whenever you are outdoors.  Tell your health care provider of new moles or changes in moles, especially if there is a change in shape or color. Also, tell your health care provider if a mole is larger than the size of a pencil eraser.  A one-time screening for abdominal aortic aneurysm (AAA) and surgical repair of large AAAs by ultrasound is recommended for men aged 68-75 years who are current or former smokers.  Stay current with your vaccines (immunizations). This information is not intended to replace advice given to you by your health care provider. Make sure you discuss any questions you have with your health care provider. Document Released: 01/06/2008 Document Revised: 07/31/2014 Document Reviewed: 04/13/2015 Elsevier Interactive Patient Education  2017 [employer].

## 2016-07-06 NOTE — Addendum Note (Signed)
Addended by: Royann Shivers A on: 07/06/2016 06:09 PM   Modules accepted: Orders

## 2016-07-06 NOTE — Progress Notes (Signed)
BP 114/64   Pulse 64   Temp 98.5 F (36.9 C) (Oral)   Wt 193 lb 8 oz (87.8 kg)   BMI 27.37 kg/m    CC: CPE Subjective:    Patient ID: [name], male    DOB: 17-Jan-1934, 82 y.o.   MRN: [id number]  HPI: CALEM BANFIELD is a 82 y.o. male presenting on 07/06/2016 for Annual Exam   Saw Katha Cabal last week for medicare wellness visit, note reviewed.  GERD - controlled on PPI Q3 days.   Noticing urinary frequency throughout the day, worse with caffeine. 2-4 x nocturia. Concerned with incomplete emptying.   Noticing cramping of RLE. Vinegar, bar of soap in sheets did not help.   Preventative: Colonoscopy - 01/2012 with Dr. Henrene Pastor 1 TA rec rpt 5 yrs Prostate - checked by Dr Alinda Money - h/o BPH. On avodart. Released from urology care - asks we continue filling avodart. Requests DRE today.  Flu shot yearly Pneumovax 2002 and 2017, prevnar 2015 Tdap 2010 zostavax 2013 Advanced planning - HCPOA and advanced directive scanned in chart 06/2015. Wife Pat then Merideth Abbey then Ellard Artis are [facility].  Seat belt use discussed Sunscreen use discussed. No changing moles on skin. Non smoker Alcohol - 1 beer/day  Lives with wife Occupation: prior worked with Clinical cytogeneticist as Recruitment consultant; volunteers at [employer] in [place],  [place]: BS Activity: works out at [employer] 3x/wk on recumbent bike Diet: some water, fruits/vegetables daily  Relevant past medical, surgical, family and social history reviewed and updated as indicated. Interim medical history since our last visit reviewed. Allergies and medications reviewed and updated. Current Outpatient Prescriptions on File Prior to Visit  Medication Sig  . aspirin EC 81 MG tablet Take 81 mg by mouth daily.  . fish oil-omega-3 fatty acids 1000 MG capsule Take 1 g by mouth daily.   . Multiple Vitamins-Minerals (MULTIVITAMIN WITH MINERALS) tablet Take 1 tablet by mouth daily.   No current facility-administered medications on file prior to  visit.     Review of Systems  Constitutional: Negative for activity change, appetite change, chills, fatigue, fever and unexpected weight change.  HENT: Negative for hearing loss.   Eyes: Negative for visual disturbance.  Respiratory: Positive for cough. Negative for chest tightness, shortness of breath and wheezing.   Cardiovascular: Negative for chest pain, palpitations and leg swelling.  Gastrointestinal: Negative for abdominal distention, abdominal pain, blood in stool, constipation, diarrhea, nausea and vomiting.  Genitourinary: Negative for difficulty urinating and hematuria.  Musculoskeletal: Negative for arthralgias, myalgias and neck pain.  Skin: Negative for rash.  Neurological: Negative for dizziness, seizures, syncope and headaches.  Hematological: Negative for adenopathy. Bruises/bleeds easily.  Psychiatric/Behavioral: Negative for dysphoric mood. The patient is not nervous/anxious.    Per HPI unless specifically indicated in ROS section     Objective:    BP 114/64   Pulse 64   Temp 98.5 F (36.9 C) (Oral)   Wt 193 lb 8 oz (87.8 kg)   BMI 27.37 kg/m   Wt Readings from Last 3 Encounters:  07/06/16 193 lb 8 oz (87.8 kg)  06/28/16 186 lb 8 oz (84.6 kg)  08/18/15 193 lb 8 oz (87.8 kg)    Physical Exam  Constitutional: He is oriented to person, place, and time. He appears well-developed and well-nourished. No distress.  HENT:  Head: Normocephalic and atraumatic.  Right Ear: Hearing, external ear and ear canal normal.  Left Ear: Hearing, tympanic membrane, external ear and  ear canal normal.  Nose: Nose normal.  Mouth/Throat: Uvula is midline, oropharynx is clear and moist and mucous membranes are normal. No oropharyngeal exudate, posterior oropharyngeal edema or posterior oropharyngeal erythema.  Cerumen covering canal  Eyes: Conjunctivae and EOM are normal. Pupils are equal, round, and reactive to light. No scleral icterus.  Neck: Normal range of motion. Neck  supple.  Cardiovascular: Normal rate, regular rhythm, normal heart sounds and intact distal pulses.   No murmur heard. Pulses:      Radial pulses are 2+ on the right side, and 2+ on the left side.  Pulmonary/Chest: Effort normal and breath sounds normal. No respiratory distress. He has no wheezes. He has no rales.  Abdominal: Soft. Bowel sounds are normal. He exhibits no distension and no mass. There is no tenderness. There is no rebound and no guarding.  Genitourinary: Rectum normal and prostate normal. Rectal exam shows no external hemorrhoid, no internal hemorrhoid, no fissure, no mass, no tenderness and anal tone normal. Prostate is not enlarged (20gm) and not tender.  Musculoskeletal: Normal range of motion. He exhibits no edema.  Lymphadenopathy:    He has no cervical adenopathy.  Neurological: He is alert and oriented to person, place, and time.  CN grossly intact, station and gait intact  Skin: Skin is warm and dry. No rash noted.  Psychiatric: He has a normal mood and affect. His behavior is normal. Judgment and thought content normal.  Nursing note and vitals reviewed.  Results for orders placed or performed in visit on 06/28/16  Lipid panel  Result Value Ref Range   Cholesterol 185 0 - 200 mg/dL   Triglycerides 96.0 0.0 - 149.0 mg/dL   HDL 61.50 >39.00 mg/dL   VLDL 19.2 0.0 - 40.0 mg/dL   LDL Cholesterol 105 (H) 0 - 99 mg/dL   Total CHOL/HDL Ratio 3    NonHDL 123456   Basic metabolic panel  Result Value Ref Range   Sodium 141 135 - 145 mEq/L   Potassium 4.6 3.5 - 5.1 mEq/L   Chloride 105 96 - 112 mEq/L   CO2 29 19 - 32 mEq/L   Glucose, Bld 92 70 - 99 mg/dL   BUN 19 6 - 23 mg/dL   Creatinine, Ser 1.12 0.40 - 1.50 mg/dL   Calcium 9.1 8.4 - 10.5 mg/dL   GFR 67.01 >60.00 mL/min  PSA, Medicare  Result Value Ref Range   PSA 0.57 0.10 - 4.00 ng/ml      Assessment & Plan:   Problem List Items Addressed This Visit    Advanced care planning/counseling discussion     Advanced planning - HCPOA and advanced directive scanned in chart 06/2015. Wife Pat then Merideth Abbey then Ellard Artis are [facility].       Benign prostatic hyperplasia    DRE/PSA stable. UA today. Dutasteride refilled.       Relevant Medications   dutasteride (AVODART) 0.5 MG capsule   GERD (gastroesophageal reflux disease)    Controlled with Q3d PPI      Relevant Medications   omeprazole (PRILOSEC) 40 MG capsule   Health maintenance examination - Primary    Preventative protocols reviewed and updated unless pt declined. Discussed healthy diet and lifestyle.       HLD (hyperlipidemia)    Chronic, stable on fish oil.       Relevant Medications   telmisartan (MICARDIS) 80 MG tablet   HTN (hypertension)    Chronic, stable. Continue current regimne.  Relevant Medications   telmisartan (MICARDIS) 80 MG tablet   Leg cramping    Right leg - suggested trial daily magnesium.          Follow up plan: Return in about 1 year (around 07/06/2017) for annual exam, prior fasting for blood work.  Ria Bush, MD

## 2016-07-06 NOTE — Assessment & Plan Note (Signed)
Advanced planning - HCPOA and advanced directive scanned in chart 06/2015. Wife Pat then Leslie Lee then Michael Candelaria are HCPOA. 

## 2016-07-06 NOTE — Progress Notes (Signed)
Pre visit review using our clinic review tool, if applicable. No additional management support is needed unless otherwise documented below in the visit note. 

## 2016-07-06 NOTE — Assessment & Plan Note (Signed)
Chronic, stable. Continue current regimne.  

## 2016-07-06 NOTE — Assessment & Plan Note (Signed)
Chronic, stable on fish oil.  

## 2016-08-28 DIAGNOSIS — M79671 Pain in right foot: Secondary | ICD-10-CM | POA: Diagnosis not present

## 2016-08-28 DIAGNOSIS — M19071 Primary osteoarthritis, right ankle and foot: Secondary | ICD-10-CM | POA: Diagnosis not present

## 2016-08-28 DIAGNOSIS — M659 Synovitis and tenosynovitis, unspecified: Secondary | ICD-10-CM | POA: Diagnosis not present

## 2016-09-12 DIAGNOSIS — L820 Inflamed seborrheic keratosis: Secondary | ICD-10-CM | POA: Diagnosis not present

## 2016-09-12 DIAGNOSIS — B079 Viral wart, unspecified: Secondary | ICD-10-CM | POA: Diagnosis not present

## 2016-09-21 ENCOUNTER — Ambulatory Visit (INDEPENDENT_AMBULATORY_CARE_PROVIDER_SITE_OTHER): Payer: PPO | Admitting: Family Medicine

## 2016-09-21 ENCOUNTER — Encounter: Payer: Self-pay | Admitting: Family Medicine

## 2016-09-21 VITALS — BP 142/76 | HR 70 | Temp 98.2°F | Wt 195.2 lb

## 2016-09-21 DIAGNOSIS — J019 Acute sinusitis, unspecified: Secondary | ICD-10-CM | POA: Insufficient documentation

## 2016-09-21 MED ORDER — AMOXICILLIN-POT CLAVULANATE 875-125 MG PO TABS: 1.0000 | ORAL_TABLET | Freq: Two times a day (BID) | ORAL | 0 refills | Status: AC

## 2016-09-21 NOTE — Progress Notes (Signed)
BP (!) 142/76   Pulse 70   Temp 98.2 F (36.8 C) (Oral)   Wt 195 lb 4 oz (88.6 kg)   SpO2 95%   BMI 27.62 kg/m    CC: cough, nasal congestion Subjective:    Patient ID: Michael Decker, male    DOB: Aug 23, 1933, 83 y.o.   MRN: [id number]  HPI: WRANGLER KLEVEN is a 83 y.o. male presenting on 09/21/2016 for Cough and Nasal Congestion   1 wk h/o sinus congestion, ST, hacking cough "can't shake it". PNdrainage. Cough productive of grey/yellow sputum. Only mild improvement noted. Some R ear pressure. Predominant sinus pressure headache/congestion with PNDrainage.   No fevers/chills, headaches, tooth pain, wheezing.   Tried OTC remedies, staying well hydrated. No sick contacts at home.  No h/o asthma.   Relevant past medical, surgical, family and social history reviewed and updated as indicated. Interim medical history since our last visit reviewed. Allergies and medications reviewed and updated. Outpatient Medications Prior to Visit  Medication Sig Dispense Refill  . aspirin EC 81 MG tablet Take 81 mg by mouth daily.    Marland Kitchen dutasteride (AVODART) 0.5 MG capsule Take 1 capsule (0.5 mg total) by mouth every other day. 45 capsule 3  . fish oil-omega-3 fatty acids 1000 MG capsule Take 1 g by mouth daily.     . Magnesium 400 MG CAPS Take 400 mg by mouth daily.    . Multiple Vitamins-Minerals (MULTIVITAMIN WITH MINERALS) tablet Take 1 tablet by mouth daily.    Marland Kitchen omeprazole (PRILOSEC) 40 MG capsule Take 1 capsule (40 mg total) by mouth daily as needed. 30 capsule 6  . telmisartan (MICARDIS) 80 MG tablet Take 1 tablet (80 mg total) by mouth daily. 90 tablet 3   No facility-administered medications prior to visit.      Per HPI unless specifically indicated in ROS section below Review of Systems     Objective:    BP (!) 142/76   Pulse 70   Temp 98.2 F (36.8 C) (Oral)   Wt 195 lb 4 oz (88.6 kg)   SpO2 95%   BMI 27.62 kg/m   Wt Readings from Last 3 Encounters:  09/21/16 195 lb 4 oz  (88.6 kg)  07/06/16 193 lb 8 oz (87.8 kg)  06/28/16 186 lb 8 oz (84.6 kg)    Physical Exam  Constitutional: He appears well-developed and well-nourished. No distress.  HENT:  Head: Normocephalic and atraumatic.  Right Ear: Hearing, tympanic membrane, external ear and ear canal normal.  Left Ear: Hearing, tympanic membrane, external ear and ear canal normal.  Nose: Mucosal edema present. No rhinorrhea. Right sinus exhibits no maxillary sinus tenderness and no frontal sinus tenderness. Left sinus exhibits no maxillary sinus tenderness and no frontal sinus tenderness.  Mouth/Throat: Uvula is midline, oropharynx is clear and moist and mucous membranes are normal. No oropharyngeal exudate, posterior oropharyngeal edema, posterior oropharyngeal erythema or tonsillar abscesses.  Ball of cerumen in R canal. Post nasal drainage present  Eyes: Conjunctivae and EOM are normal. Pupils are equal, round, and reactive to light. No scleral icterus.  Neck: Normal range of motion. Neck supple.  Cardiovascular: Normal rate, regular rhythm, normal heart sounds and intact distal pulses.   No murmur heard. Pulmonary/Chest: Effort normal and breath sounds normal. No respiratory distress. He has no wheezes. He has no rales.  Lymphadenopathy:    He has no cervical adenopathy.  Skin: Skin is warm and dry. No rash noted.  Nursing note and  vitals reviewed.     Assessment & Plan:   Problem List Items Addressed This Visit    Acute sinusitis - Primary    Anticipate viral given short duration. Discussed supportive care with rest, fluids, NSAIDs, and mucinex, neti pot. WASP for augmentin provided in case symptoms ongoing past 10 days into weekend. Discussed expected course of resolution.       Relevant Medications   amoxicillin-clavulanate (AUGMENTIN) 875-125 MG tablet       Follow up plan: Return if symptoms worsen or fail to improve.  Ria Bush, MD

## 2016-09-21 NOTE — Progress Notes (Signed)
Pre visit review using our clinic review tool, if applicable. No additional management support is needed unless otherwise documented below in the visit note. 

## 2016-09-21 NOTE — Patient Instructions (Addendum)
You do have sinus infection - likely viral.  Push fluids and plenty of rest. Take aleve 1 tablet up to twice a day with meals.  Nasal saline irrigation or neti pot to help drain sinuses. May use plain mucinex with plenty of fluid to help mobilize mucous. If ongoing into weekend, or worsening symptoms, fill augmentin antibiotic prescription provided today.  Please let us know if fever >101.5, trouble opening/closing mouth, difficulty swallowing, or worsening instead of improving as expected.

## 2016-09-21 NOTE — Assessment & Plan Note (Signed)
Anticipate viral given short duration. Discussed supportive care with rest, fluids, NSAIDs, and mucinex, neti pot. WASP for augmentin provided in case symptoms ongoing past 10 days into weekend. Discussed expected course of resolution.

## 2016-11-22 ENCOUNTER — Encounter: Payer: Self-pay | Admitting: Internal Medicine

## 2016-11-23 ENCOUNTER — Encounter: Payer: Self-pay | Admitting: Internal Medicine

## 2016-11-23 ENCOUNTER — Telehealth: Payer: Self-pay | Admitting: Internal Medicine

## 2016-11-23 NOTE — Telephone Encounter (Signed)
Keep appointment. I know him. OK for pre-visit at age 83. Thanks for checking

## 2016-11-23 NOTE — Telephone Encounter (Signed)
I did not notice that the patient is already 83yrs old.   I sent him a letter yesterday that it was time for his Colonoscopy  as per the Recall Assesment sheet.  Should I have sent the letter that says he needs an ov 1st due to his age?  He has My Chart and saw the letter before it was even mailed.  [facility] scheduled him directly also not realizing that he is 83yrs old by error.  Please let me know if I should get his appointment changed.

## 2016-11-23 NOTE — Telephone Encounter (Signed)
Dr. Henrene Pastor please see note below and advise.

## 2017-02-01 DIAGNOSIS — H6123 Impacted cerumen, bilateral: Secondary | ICD-10-CM | POA: Diagnosis not present

## 2017-02-01 DIAGNOSIS — H903 Sensorineural hearing loss, bilateral: Secondary | ICD-10-CM | POA: Diagnosis not present

## 2017-02-06 ENCOUNTER — Encounter: Payer: PPO | Admitting: Internal Medicine

## 2017-02-21 HISTORY — PX: COLONOSCOPY: SHX174

## 2017-02-27 ENCOUNTER — Ambulatory Visit (AMBULATORY_SURGERY_CENTER): Payer: Self-pay

## 2017-02-27 VITALS — Ht 71.5 in | Wt 187.0 lb

## 2017-02-27 DIAGNOSIS — Z860100 Personal history of colon polyps, unspecified: Secondary | ICD-10-CM

## 2017-02-27 MED ORDER — SUPREP BOWEL PREP KIT 17.5-3.13-1.6 GM/177ML PO SOLN: 1.0000 | Freq: Once | ORAL | 0 refills | Status: AC

## 2017-02-27 NOTE — Progress Notes (Signed)
No allergies to eggs or soy No home oxygen No diet meds No past problems with anesthesia Since age 83 ([place])  Declined emmi

## 2017-03-01 ENCOUNTER — Encounter: Payer: Self-pay | Admitting: Internal Medicine

## 2017-03-02 ENCOUNTER — Telehealth: Payer: Self-pay | Admitting: Internal Medicine

## 2017-03-02 MED ORDER — OMEPRAZOLE 40 MG PO CPDR: 40.0000 mg | DELAYED_RELEASE_CAPSULE | Freq: Every day | ORAL | 6 refills | Status: DC | PRN

## 2017-03-02 NOTE — Telephone Encounter (Signed)
Omeprazole refilled.

## 2017-03-12 DIAGNOSIS — L820 Inflamed seborrheic keratosis: Secondary | ICD-10-CM | POA: Diagnosis not present

## 2017-03-12 DIAGNOSIS — L708 Other acne: Secondary | ICD-10-CM | POA: Diagnosis not present

## 2017-03-13 ENCOUNTER — Encounter: Payer: Self-pay | Admitting: Internal Medicine

## 2017-03-13 ENCOUNTER — Ambulatory Visit (AMBULATORY_SURGERY_CENTER): Payer: PPO | Admitting: Internal Medicine

## 2017-03-13 VITALS — BP 127/62 | HR 51 | Temp 97.3°F | Resp 11 | Ht 71.5 in | Wt 180.0 lb

## 2017-03-13 DIAGNOSIS — D122 Benign neoplasm of ascending colon: Secondary | ICD-10-CM | POA: Diagnosis not present

## 2017-03-13 DIAGNOSIS — K219 Gastro-esophageal reflux disease without esophagitis: Secondary | ICD-10-CM | POA: Diagnosis not present

## 2017-03-13 DIAGNOSIS — I10 Essential (primary) hypertension: Secondary | ICD-10-CM | POA: Diagnosis not present

## 2017-03-13 DIAGNOSIS — Z86010 Personal history of colonic polyps: Secondary | ICD-10-CM

## 2017-03-13 MED ORDER — SODIUM CHLORIDE 0.9 % IV SOLN: 500.0000 mL | INTRAVENOUS | Status: DC

## 2017-03-13 NOTE — Progress Notes (Signed)
Called to room to assist during endoscopic procedure.  Patient ID and intended procedure confirmed with present staff. Received instructions for my participation in the procedure from the performing physician.  

## 2017-03-13 NOTE — Progress Notes (Signed)
To PACU VSS. Report to [email or web address] 

## 2017-03-13 NOTE — Patient Instructions (Signed)
YOU HAD AN ENDOSCOPIC PROCEDURE TODAY AT [facility]:   Refer to the procedure report that was given to you for any specific questions about what was found during the examination.  If the procedure report does not answer your questions, please call your gastroenterologist to clarify.  If you requested that your care partner not be given the details of your procedure findings, then the procedure report has been included in a sealed envelope for you to review at your convenience later.  YOU SHOULD EXPECT: Some feelings of bloating in the abdomen. Passage of more gas than usual.  Walking can help get rid of the air that was put into your GI tract during the procedure and reduce the bloating. If you had a lower endoscopy (such as a colonoscopy or flexible sigmoidoscopy) you may notice spotting of blood in your stool or on the toilet paper. If you underwent a bowel prep for your procedure, you may not have a normal bowel movement for a few days.  Please Note:  You might notice some irritation and congestion in your nose or some drainage.  This is from the oxygen used during your procedure.  There is no need for concern and it should clear up in a day or so.  SYMPTOMS TO REPORT IMMEDIATELY:   Following lower endoscopy (colonoscopy or flexible sigmoidoscopy):  Excessive amounts of blood in the stool  Significant tenderness or worsening of abdominal pains  Swelling of the abdomen that is new, acute  Fever of 100F or higher   For urgent or emergent issues, a gastroenterologist can be reached at any hour by calling [phone].   DIET:  We do recommend a small meal at first, but then you may proceed to your regular diet.  Drink plenty of fluids but you should avoid alcoholic beverages for 24 hours.  ACTIVITY:  You should plan to take it easy for the rest of today and you should NOT DRIVE or use heavy machinery until tomorrow (because of the sedation medicines used during the test).     FOLLOW UP: Our staff will call the number listed on your records the next business day following your procedure to check on you and address any questions or concerns that you may have regarding the information given to you following your procedure. If we do not reach you, we will leave a message.  However, if you are feeling well and you are not experiencing any problems, there is no need to return our call.  We will assume that you have returned to your regular daily activities without incident.  If any biopsies were taken you will be contacted by phone or by letter within the next 1-3 weeks.  Please call us at [phone] if you have not heard about the biopsies in 3 weeks.    SIGNATURES/CONFIDENTIALITY: You and/or your care partner have signed paperwork which will be entered into your electronic medical record.  These signatures attest to the fact that that the information above on your After Visit Summary has been reviewed and is understood.  Full responsibility of the confidentiality of this discharge information lies with you and/or your care-partner.  Polyps, diverticulosis, and high fiber diet information given.

## 2017-03-13 NOTE — Progress Notes (Signed)
Pt's states no medical or surgical changes since previsit or office visit. 

## 2017-03-13 NOTE — Op Note (Signed)
[facility] Patient Name: Michael Decker Procedure Date: 03/13/2017 1:28 PM MRN: [id number] Endoscopist: Docia Chuck. Henrene Pastor , MD Age: 83 Referring MD:  Date of Birth: 04/21/1934 Gender: Male Account #: [id number] Procedure:                Colonoscopy,with cold snare polypectomy x 2 Indications:              High risk colon cancer surveillance: Personal                            history of multiple (3 or more) adenomas. Previous                            examinations 1996, 1997, 1999, 2002, 2005, 2008,                            2013 Medicines:                Monitored Anesthesia Care Procedure:                Pre-Anesthesia Assessment:                           - Prior to the procedure, a History and Physical                            was performed, and patient medications and                            allergies were reviewed. The patient's tolerance of                            previous anesthesia was also reviewed. The risks                            and benefits of the procedure and the sedation                            options and risks were discussed with the patient.                            All questions were answered, and informed consent                            was obtained. Prior Anticoagulants: The patient has                            taken no previous anticoagulant or antiplatelet                            agents. ASA Grade Assessment: II - A patient with                            mild systemic disease. After reviewing the risks  and benefits, the patient was deemed in                            satisfactory condition to undergo the procedure.                           After obtaining informed consent, the colonoscope                            was passed under direct vision. Throughout the                            procedure, the patient's blood pressure, pulse, and                            oxygen saturations were monitored  continuously. The                            Model CF-HQ190L [id number]) scope was introduced                            through the anus and advanced to the the cecum,                            identified by appendiceal orifice and ileocecal                            valve. The ileocecal valve, appendiceal orifice,                            and rectum were photographed. The quality of the                            bowel preparation was excellent. The colonoscopy                            was performed without difficulty. The patient                            tolerated the procedure well. The bowel preparation                            used was SUPREP. Scope In: 1:38:30 PM Scope Out: 1:53:35 PM Scope Withdrawal Time: 0 hours 12 minutes 8 seconds  Total Procedure Duration: 0 hours 15 minutes 5 seconds  Findings:                 Two polyps were found in the ascending colon. The                            polyps were 1 to 2 mm in size. These polyps were                            removed with a cold snare. Resection and  retrieval                            were complete.                           Multiple diverticula were found in the sigmoid                            colon.                           Internal hemorrhoids were found during retroflexion.                           The exam was otherwise without abnormality on                            direct and retroflexion views. Complications:            No immediate complications. Estimated blood loss:                            None. Estimated Blood Loss:     Estimated blood loss: none. Impression:               - Two 1 to 2 mm polyps in the ascending colon,                            removed with a cold snare. Resected and retrieved.                           - Diverticulosis in the sigmoid colon.                           - Internal hemorrhoids.                           - The examination was otherwise normal on direct                             and retroflexion views. Recommendation:           - Repeat colonoscopy is not recommended for                            surveillance.                           - Patient has a contact number available for                            emergencies. The signs and symptoms of potential                            delayed complications were discussed with the  patient. Return to normal activities tomorrow.                            Written discharge instructions were provided to the                            patient.                           - Resume previous diet.                           - Continue present medications.                           - Await pathology results. Docia Chuck. Henrene Pastor, MD 03/13/2017 1:59:04 PM This report has been signed electronically.

## 2017-03-14 ENCOUNTER — Telehealth: Payer: Self-pay

## 2017-03-14 NOTE — Telephone Encounter (Signed)
  Follow up Call-  Call back number 03/13/2017 11/13/2014  Post procedure Call Back phone  # [phone]  Permission to leave phone message Yes Yes  Some recent data might be hidden     Patient questions:  Do you have a fever, pain , or abdominal swelling? No. Pain Score  0 *  Have you tolerated food without any problems? Yes.    Have you been able to return to your normal activities? Yes.    Do you have any questions about your discharge instructions: Diet   No. Medications  No. Follow up visit  No.  Do you have questions or concerns about your Care? No.  Actions: * If pain score is 4 or above: No action needed, pain <4.

## 2017-03-19 ENCOUNTER — Encounter: Payer: Self-pay | Admitting: Internal Medicine

## 2017-03-24 ENCOUNTER — Encounter: Payer: Self-pay | Admitting: Family Medicine

## 2017-03-28 DIAGNOSIS — M1712 Unilateral primary osteoarthritis, left knee: Secondary | ICD-10-CM | POA: Diagnosis not present

## 2017-04-09 ENCOUNTER — Encounter: Payer: Self-pay | Admitting: Family Medicine

## 2017-04-09 ENCOUNTER — Ambulatory Visit (INDEPENDENT_AMBULATORY_CARE_PROVIDER_SITE_OTHER): Payer: PPO | Admitting: Family Medicine

## 2017-04-09 VITALS — BP 128/60 | HR 57 | Temp 98.1°F | Wt 183.2 lb

## 2017-04-09 DIAGNOSIS — R252 Cramp and spasm: Secondary | ICD-10-CM | POA: Diagnosis not present

## 2017-04-09 MED ORDER — LOSARTAN POTASSIUM 100 MG PO TABS: 100.0000 mg | ORAL_TABLET | Freq: Every day | ORAL | 3 refills | Status: DC

## 2017-04-09 MED ORDER — ASPIRIN EC 81 MG PO TBEC: 81.0000 mg | DELAYED_RELEASE_TABLET | ORAL | Status: DC

## 2017-04-09 MED ORDER — DUTASTERIDE 0.5 MG PO CAPS: 0.5000 mg | ORAL_CAPSULE | ORAL | 3 refills | Status: DC

## 2017-04-09 NOTE — Patient Instructions (Addendum)
Add stretching prior to bedtime.  May continue mustard or try vinegar.  Stay well hydrated.  Trial gatorade or tonic water at bedtime. Consider switch to losartan 100mg  (same family) instead of telmisartan.  Let us know if not improving with this.

## 2017-04-09 NOTE — Progress Notes (Signed)
BP 128/60 (BP Location: Left Arm, Patient Position: Sitting, Cuff Size: Normal)   Pulse (!) 57   Temp 98.1 F (36.7 C) (Oral)   Wt 183 lb 4 oz (83.1 kg)   SpO2 97%   BMI 25.20 kg/m    CC: leg pain Subjective:    Patient ID: Michael Decker, male    DOB: Mar 16, 1934, 83 y.o.   MRN: [id number]  HPI: Michael Decker is a 83 y.o. male presenting on 04/09/2017 for Leg Pain (Feels like cramp in bilateral leg, more frequent in lower right let)   1+ yr h/o progressive bilateral thigh cramps and severe nocturnal R foot and lower leg cramp that is becoming more frequent - happening every night, wakes him up at night. This doesn't happen during the day.   Mustard helps. Pickle juice helps. Bar of soap between sheets did not help.  He is already taking magnesium 400mg  capsule QOD.   H/o R great toe surgery.   This is separate from his L knee pain for which he saw ortho earlier this month and received steroid injection.   [facility]  Stays well hydrated Stretches in the mornings.   The ASCVD Risk score Michael Bussing DC Jr., et al., 2013) failed to calculate for the following reasons:   The 2013 ASCVD risk score is only valid for ages 50 to 65   Relevant past medical, surgical, family and social history reviewed and updated as indicated. Interim medical history since our last visit reviewed. Allergies and medications reviewed and updated. Outpatient Medications Prior to Visit  Medication Sig Dispense Refill  . fish oil-omega-3 fatty acids 1000 MG capsule Take 1 g by mouth daily.     . Magnesium 400 MG CAPS Take 400 mg by mouth daily.    . Multiple Vitamins-Minerals (MULTIVITAMIN WITH MINERALS) tablet Take 1 tablet by mouth daily.    Marland Kitchen omeprazole (PRILOSEC) 40 MG capsule Take 1 capsule (40 mg total) by mouth daily as needed. 30 capsule 6  . telmisartan (MICARDIS) 80 MG tablet Take 1 tablet (80 mg total) by mouth daily. 90 tablet 3  . aspirin EC 81 MG tablet Take 81 mg by mouth  daily.    Marland Kitchen dutasteride (AVODART) 0.5 MG capsule Take 1 capsule (0.5 mg total) by mouth every other day. 45 capsule 3   Facility-Administered Medications Prior to Visit  Medication Dose Route Frequency Provider Last Rate Last Dose  . 0.9 %  sodium chloride infusion  500 mL Intravenous Continuous Irene Shipper, MD         Per HPI unless specifically indicated in ROS section below Review of Systems     Objective:    BP 128/60 (BP Location: Left Arm, Patient Position: Sitting, Cuff Size: Normal)   Pulse (!) 57   Temp 98.1 F (36.7 C) (Oral)   Wt 183 lb 4 oz (83.1 kg)   SpO2 97%   BMI 25.20 kg/m   Wt Readings from Last 3 Encounters:  04/09/17 183 lb 4 oz (83.1 kg)  03/13/17 180 lb (81.6 kg)  02/27/17 187 lb (84.8 kg)    Physical Exam  Constitutional: He appears well-developed and well-nourished. No distress.  Musculoskeletal: Normal range of motion. He exhibits no edema.  2+ DP/PT on right No pain to palpation at calf or with palpation No pain with calcaneal squeeze No ligament laxity  Skin: Skin is warm and dry. No rash noted.  Psychiatric: He has a  normal mood and affect.  Nursing note and vitals reviewed.     Assessment & Plan:   Problem List Items Addressed This Visit    Leg cramping - Primary    Nocturnal leg cramp progressively worsening.  Suggested supportive measures as per pt instructions.  ?ARB related - provided with losartan 100mg  written Rx to try in case telmisartan related.  Update with effect.           Follow up plan: Return if symptoms worsen or fail to improve.  Ria Bush, MD

## 2017-04-09 NOTE — Assessment & Plan Note (Addendum)
Nocturnal leg cramp progressively worsening.  Suggested supportive measures as per pt instructions.  ?ARB related - provided with losartan 100mg  written Rx to try in case telmisartan related.  Update with effect.

## 2017-04-15 NOTE — Progress Notes (Signed)
[facility] [place], [place] [address] Phone: [phone] Subjective:    I'm seeing this patient by the request  of:  Ria Bush, MD   CC: knee pain  [id number]  Michael Decker is a 83 y.o. male coming in with complaint of knee pain. Patient is seen other providers for this previously. Patient does have what appears to be moderate arthritis she states. Has had x-rays. Patient was seen by another physician a week ago. Patient states Pain is worsening at this time. Describes pain as a dull, throbbing aching pain. This is all in the left knee. Has been wearing a brace that seems to be somewhat more stable. Rates the severity of pain as 5 out of 10. Sometimes has a sharp pain causes patient to discontinue activity. Bracing recently seems to be helping.     Past Medical History:  Diagnosis Date  . BPH (benign prostatic hypertrophy)    on avodart, released from urologist care  . Colon polyps 2013   TA 2013 Henrene Pastor)  . Diverticulosis   . GERD (gastroesophageal reflux disease)   . Hematospermia    s/p uro eval  . HLD (hyperlipidemia)    mild  . HTN (hypertension)    Past Surgical History:  Procedure Laterality Date  . BUNIONECTOMY Right 2014  . COLONOSCOPY  2008  . COLONOSCOPY  01/2012   TA x1, diverticulosis, rpt 5 yrs Henrene Pastor)  . COLONOSCOPY  02/2017   TA, diverticulosis, int hem no f/u needed Henrene Pastor)  . CYSTECTOMY    . TONSILLECTOMY  1944  . UPPER GI ENDOSCOPY     Social History   Social History  . Marital status: Married    Spouse name: N/A  . Number of children: N/A  . Years of education: N/A   Social History Main Topics  . Smoking status: Former Research scientist (life sciences)  . Smokeless tobacco: Never Used     Comment: cigars back in the 60's  . Alcohol use 4.2 oz/week    7 Cans of beer per week     Comment: one beer a night (12 oz)  . Drug use: No  . Sexual activity: Yes    Partners: Female   Other Topics Concern  . 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 Sudden death Father 27       ?MI  . Heart disease Brother 102       ?afib  . Cancer Neg Hx   . Diabetes Neg Hx   . Stroke Neg Hx   . Colon cancer Neg Hx   . Esophageal cancer Neg Hx   . Stomach cancer Neg Hx      Past medical history, social, surgical and family history all reviewed in electronic medical record.  No pertanent information unless stated regarding to the chief complaint.   Review of Systems:Review of systems updated and as accurate as of 04/15/17  No headache, visual changes, nausea, vomiting, diarrhea, constipation, dizziness, abdominal pain, skin rash, fevers, chills, night sweats, weight loss, swollen lymph nodes, body  aches, joint swelling, muscle aches, chest pain, shortness of breath, mood changes.   Objective  There were no vitals taken for this visit. Systems examined below as of 04/15/17   General: No apparent distress alert and oriented x3 mood and affect normal, dressed appropriately.  HEENT: Pupils equal, extraocular movements intact  Respiratory: Patient's speak in full sentences and does not appear short of breath  Cardiovascular: No lower extremity edema, non tender, no erythema  Skin: Warm dry intact with no signs of infection or rash on extremities or on axial skeleton.  Abdomen: Soft nontender  Neuro: Cranial nerves II through XII are intact, neurovascularly intact in all extremities with 2+ DTRs and 2+ pulses.  Lymph: No lymphadenopathy of posterior or anterior cervical chain or axillae bilaterally.  Gait normal with good balance and coordination.  MSK:  Non tender with full range of  motion and good stability and symmetric strength and tone of shoulders, elbows, wrist, hip, and ankles bilaterally.  Knee: Left valgus deformity noted. Abnormal thigh to calf ratio.  Tender to palpation over medial and PF joint line.  ROM full in flexion and extension and lower leg rotation. instability with valgus force.  painful patellar compression. Patellar glide with moderate crepitus. Patellar and quadriceps tendons unremarkable. Hamstring and quadriceps strength is normal. Contralateral knee shows mild arthritic changes minimal pain  MSK US performed of: Left knee pain This study was ordered, performed, and interpreted by Charlann Boxer D.O.  Knee: Moderate to severe narrowing of the medial compartment. Patient does have a degenerative tear of the meniscus with some displacement. Seems to be acute on chronic.  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  This included exercises focusing on stretching, strengthening, with significant focus on eccentric aspects.   Long term goals include an improvement in range of motion, strength, endurance as well as avoiding reinjury. Patient's frequency would include in 1-2 times a day, 3-5 times a week for a duration of 6-12 weeks.   Reviewed anatomy using anatomical model and how PFS occurs.  Given rehab exercises handout for VMO, hip abductors, core, entire kinetic chain including proprioception exercises including cone touches, step downs, hip elevations and turn outs.  Could benefit from PT, regular exercise, upright biking, and a PFS knee brace to assist with tracking abnormalities.  Proper technique shown and discussed handout in great detail with ATC.  All questions were discussed and answered.     Impression and Recommendations:     This case required medical decision making of moderate complexity.       Note: This dictation was prepared with Dragon dictation along with smaller phrase technology. Any transcriptional errors that result from this process are unintentional.

## 2017-04-16 ENCOUNTER — Encounter: Payer: Self-pay | Admitting: Family Medicine

## 2017-04-16 ENCOUNTER — Ambulatory Visit: Payer: Self-pay

## 2017-04-16 ENCOUNTER — Ambulatory Visit (INDEPENDENT_AMBULATORY_CARE_PROVIDER_SITE_OTHER): Payer: PPO | Admitting: Family Medicine

## 2017-04-16 VITALS — BP 130/68 | HR 65 | Wt 185.0 lb

## 2017-04-16 DIAGNOSIS — M25562 Pain in left knee: Secondary | ICD-10-CM | POA: Diagnosis not present

## 2017-04-16 DIAGNOSIS — M1712 Unilateral primary osteoarthritis, left knee: Secondary | ICD-10-CM | POA: Diagnosis not present

## 2017-04-16 MED ORDER — VITAMIN D (ERGOCALCIFEROL) 1.25 MG (50000 UNIT) PO CAPS: 50000.0000 [IU] | ORAL_CAPSULE | ORAL | 0 refills | Status: DC

## 2017-04-16 NOTE — Assessment & Plan Note (Signed)
Patient does have arthritis of the left knee. Once weekly vitamin D for strength and endurance, we discussed icing regimen, over-the-counter natural supplementations, we discussed which activities to do and patient work with Product/process development scientist to learn him in greater detail. We discussed avoiding any type of twisting sensation and more concentration on lower impact cardiovascular. Patient will come back and see me again in 4 weeks. Worsening symptoms consider injection

## 2017-04-16 NOTE — Patient Instructions (Signed)
Good to see you   Have arthritis and a meniscal tear Once weekly vitamin D for 12 weeks.  Ice 20 minutes 2 times daily. Usually after activity and before bed. Exercises 3 times a week.  Ok to bike or elliptical and machine weights at the gym Avoid twisting.  Wear brace with working out.  Over the counter  Turmeric 500mg  daily  Tart cherry extract at night See me again in 4-6 weeks.

## 2017-04-20 ENCOUNTER — Ambulatory Visit (INDEPENDENT_AMBULATORY_CARE_PROVIDER_SITE_OTHER): Payer: PPO | Admitting: Family Medicine

## 2017-04-20 ENCOUNTER — Encounter: Payer: Self-pay | Admitting: Family Medicine

## 2017-04-20 VITALS — BP 132/60 | HR 59 | Temp 98.2°F | Wt 185.2 lb

## 2017-04-20 DIAGNOSIS — R252 Cramp and spasm: Secondary | ICD-10-CM | POA: Diagnosis not present

## 2017-04-20 LAB — COMPREHENSIVE METABOLIC PANEL
ALBUMIN: 3.7 g/dL (ref 3.5–5.2)
ALK PHOS: 59 U/L (ref 39–117)
ALT: 22 U/L (ref 0–53)
AST: 23 U/L (ref 0–37)
BUN: 20 mg/dL (ref 6–23)
CHLORIDE: 98 meq/L (ref 96–112)
CO2: 29 mEq/L (ref 19–32)
Calcium: 9.1 mg/dL (ref 8.4–10.5)
Creatinine, Ser: 1.05 mg/dL (ref 0.40–1.50)
GFR: 72.04 mL/min (ref >60.00)
Glucose, Bld: 90 mg/dL (ref 70–99)
POTASSIUM: 4.5 meq/L (ref 3.5–5.1)
Sodium: 132 mEq/L — ABNORMAL LOW (ref 135–145)
TOTAL PROTEIN: 6.1 g/dL (ref 6.0–8.3)
Total Bilirubin: 0.7 mg/dL (ref 0.2–1.2)

## 2017-04-20 LAB — CBC WITH DIFFERENTIAL/PLATELET
BASOS ABS: 0 10*3/uL (ref 0.0–0.1)
BASOS PCT: 0.6 % (ref 0.0–3.0)
EOS ABS: 0.1 10*3/uL (ref 0.0–0.7)
Eosinophils Relative: 1.2 % (ref 0.0–5.0)
HCT: 43.1 % (ref 39.0–52.0)
HEMOGLOBIN: 14.4 g/dL (ref 13.0–17.0)
Lymphocytes Relative: 18.9 % (ref 12.0–46.0)
Lymphs Abs: 1.4 10*3/uL (ref 0.7–4.0)
MCHC: 33.5 g/dL (ref 30.0–36.0)
MCV: 99.4 fl (ref 78.0–100.0)
MONO ABS: 0.8 10*3/uL (ref 0.1–1.0)
Monocytes Relative: 10.3 % (ref 3.0–12.0)
Neutro Abs: 5.2 10*3/uL (ref 1.4–7.7)
Neutrophils Relative %: 69 % (ref 43.0–77.0)
Platelets: 209 10*3/uL (ref 150.0–400.0)
RBC: 4.34 Mil/uL (ref 4.22–5.81)
RDW: 12.6 % (ref 11.5–15.5)
WBC: 7.5 10*3/uL (ref 4.0–10.5)

## 2017-04-20 LAB — TSH: TSH: 1.28 u[IU]/mL (ref 0.35–4.50)

## 2017-04-20 LAB — MAGNESIUM: MAGNESIUM: 2 mg/dL (ref 1.5–2.5)

## 2017-04-20 LAB — IBC PANEL
IRON: 166 ug/dL — AB (ref 42–165)
Saturation Ratios: 53.4 % — ABNORMAL HIGH (ref 20.0–50.0)
Transferrin: 222 mg/dL (ref 212.0–360.0)

## 2017-04-20 LAB — CK: CK TOTAL: 140 U/L (ref 7–232)

## 2017-04-20 MED ORDER — GABAPENTIN 100 MG PO CAPS: 100.0000 mg | ORAL_CAPSULE | Freq: Two times a day (BID) | ORAL | 3 refills | Status: DC

## 2017-04-20 NOTE — Patient Instructions (Addendum)
Labs today. Trial gabapentin 100mg  nightly for a few days then may increase to twice daily.  Let me know how this helps.

## 2017-04-20 NOTE — Assessment & Plan Note (Signed)
Daytime and night time cramping that is progressively worsening. Check labs today. Failed OTC remedies. Did not try change in ARB. Has not tried tonic water. Will Rx gabapentin 100mg  nightly with taper up to bid. If no improvement, consider transition to diltiazem. Pt agrees with plan.

## 2017-04-20 NOTE — Progress Notes (Signed)
BP 132/60 (BP Location: Left Arm, Patient Position: Sitting, Cuff Size: Normal)   Pulse (!) 59   Temp 98.2 F (36.8 C) (Oral)   Wt 185 lb 4 oz (84 kg)   SpO2 96%   BMI 25.48 kg/m     CC: Leg cramping Subjective:    Patient ID: Michael Decker, male    DOB: 05-Jun-1934, 83 y.o.   MRN: [id number]  HPI: Michael Decker is a 83 y.o. male presenting on 04/20/2017 for Leg Pain (Here for f/u on leg cramp in right leg. No improvement)   See prior note for details.  Seen here 10 days ago with progressively worsening thigh cramping and lower leg cramping.   We added stretching exercises at bedtime, encouraged hydration status, and switched ARB from telmisartan to losartan (he did not try this). No improvement with this. Actually progressively worsening. Now hands have started cramping. Did not try tonic water.   He requests labwork today.   Mustard helps. Pickle juice helps. Bar of soap between sheets did not help.  He is already taking magnesium 400mg  capsule QOD.   Relevant past medical, surgical, family and social history reviewed and updated as indicated. Interim medical history since our last visit reviewed. Allergies and medications reviewed and updated. Outpatient Medications Prior to Visit  Medication Sig Dispense Refill  . aspirin EC 81 MG tablet Take 1 tablet (81 mg total) by mouth every Monday, Wednesday, and Friday.    . dutasteride (AVODART) 0.5 MG capsule Take 1 capsule (0.5 mg total) by mouth every other day. 90 capsule 3  . fish oil-omega-3 fatty acids 1000 MG capsule Take 1 g by mouth daily.     . Magnesium 400 MG CAPS Take 400 mg by mouth daily.    . Multiple Vitamins-Minerals (MULTIVITAMIN WITH MINERALS) tablet Take 1 tablet by mouth daily.    Marland Kitchen omeprazole (PRILOSEC) 40 MG capsule Take 1 capsule (40 mg total) by mouth daily as needed. 30 capsule 6  . Vitamin D, Ergocalciferol, (DRISDOL) 50000 units CAPS capsule Take 1 capsule (50,000 Units total) by mouth every 7 (seven)  days. 12 capsule 0   Facility-Administered Medications Prior to Visit  Medication Dose Route Frequency Provider Last Rate Last Dose  . 0.9 %  sodium chloride infusion  500 mL Intravenous Continuous Irene Shipper, MD         Per HPI unless specifically indicated in ROS section below Review of Systems     Objective:    BP 132/60 (BP Location: Left Arm, Patient Position: Sitting, Cuff Size: Normal)   Pulse (!) 59   Temp 98.2 F (36.8 C) (Oral)   Wt 185 lb 4 oz (84 kg)   SpO2 96%   BMI 25.48 kg/m    Wt Readings from Last 3 Encounters:  04/20/17 185 lb 4 oz (84 kg)  04/16/17 185 lb (83.9 kg)  04/09/17 183 lb 4 oz (83.1 kg)    Physical Exam  Constitutional: He appears well-developed and well-nourished. No distress.  Musculoskeletal: He exhibits no edema.  Skin: Skin is warm and dry. No rash noted.  Nursing note and vitals reviewed.     Assessment & Plan:   Problem List Items Addressed This Visit    Leg cramping - Primary    Daytime and night time cramping that is progressively worsening. Check labs today. Failed OTC remedies. Did not try change in ARB. Has not tried tonic water. Will Rx gabapentin 100mg  nightly with taper up to  bid. If no improvement, consider transition to diltiazem. Pt agrees with plan.       Relevant Medications   telmisartan (MICARDIS) 80 MG tablet   Other Relevant Orders   IBC panel   Comprehensive metabolic panel   TSH   CK   Magnesium   CBC with Differential/Platelet       Follow up plan: Return if symptoms worsen or fail to improve.  Ria Bush, MD

## 2017-04-30 ENCOUNTER — Encounter: Payer: Self-pay | Admitting: Family Medicine

## 2017-05-02 MED ORDER — DILTIAZEM HCL ER COATED BEADS 120 MG PO CP24: 120.0000 mg | ORAL_CAPSULE | Freq: Every day | ORAL | 3 refills | Status: DC

## 2017-05-02 MED ORDER — DILTIAZEM HCL ER COATED BEADS 120 MG PO CP24
120.0000 mg | ORAL_CAPSULE | Freq: Every day | ORAL | 3 refills | Status: DC
Start: 2017-05-02 — End: 2017-06-08

## 2017-05-02 NOTE — Addendum Note (Signed)
Addended by: Ria Bush on: 05/02/2017 07:21 PM   Modules accepted: Orders

## 2017-05-22 ENCOUNTER — Encounter: Payer: Self-pay | Admitting: Family Medicine

## 2017-05-22 ENCOUNTER — Ambulatory Visit (INDEPENDENT_AMBULATORY_CARE_PROVIDER_SITE_OTHER): Payer: PPO | Admitting: Family Medicine

## 2017-05-22 DIAGNOSIS — M1712 Unilateral primary osteoarthritis, left knee: Secondary | ICD-10-CM

## 2017-05-22 NOTE — Patient Instructions (Signed)
Sorry for being late Ice 20 minutes 2 times daily. Usually after activity and before bed. pennsaid pinkie amount topically 2 times daily as needed.   ,zexe See me again in 6-8 weeks

## 2017-05-22 NOTE — Progress Notes (Signed)
[facility] [address], [place] [address] Phone: [phone] Subjective:    I'm seeing this patient by the request  of:    CC: Left knee pain follow-up  [id number]  Michael Decker is a 83 y.o. male coming in with complaint of left knee pain. Found to have severe arthritic changes with those as well as a meniscal tear. Patient is making progress. States 60% better. Patient is still concerned because patient is going out of the country. Feels like he does do better when he wears the brace. Patient still states some very mild instability but overall can do most daily activities.     Past Medical History:  Diagnosis Date  . BPH (benign prostatic hypertrophy)    on avodart, released from urologist care  . Colon polyps 2013   TA 2013 Henrene Pastor)  . Diverticulosis   . GERD (gastroesophageal reflux disease)   . Hematospermia    s/p uro eval  . HLD (hyperlipidemia)    mild  . HTN (hypertension)    Past Surgical History:  Procedure Laterality Date  . BUNIONECTOMY Right 2014  . COLONOSCOPY  2008  . COLONOSCOPY  01/2012   TA x1, diverticulosis, rpt 5 yrs Henrene Pastor)  . COLONOSCOPY  02/2017   TA, diverticulosis, int hem no f/u needed Henrene Pastor)  . CYSTECTOMY    . TONSILLECTOMY  1944  . UPPER GI ENDOSCOPY     Social History   Social History  . Marital status: Married    Spouse name: N/A  . Number of children: N/A  . Years of education: N/A   Social History Main Topics  . Smoking status: Former Research scientist (life sciences)  . Smokeless tobacco: Never Used     Comment: cigars back in the 60's  . Alcohol use 4.2 oz/week    7 Cans of beer per week     Comment: one beer a night (12 oz)  . Drug use: No  . Sexual activity: Yes    Partners: Female   Other Topics Concern  . 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 Sudden death Father 17       ?MI  . Heart disease Brother 45       ?afib  . Cancer Neg Hx   . Diabetes Neg Hx   . Stroke Neg Hx   . Colon cancer Neg Hx   . Esophageal cancer Neg Hx   . Stomach cancer Neg Hx      Past medical history, social, surgical and family history all reviewed in electronic medical record.  No pertanent information unless stated regarding to the chief complaint.   Review of Systems:Review of systems updated and as accurate as of 05/22/17  No headache, visual changes, nausea, vomiting, diarrhea, constipation, dizziness, abdominal pain, skin rash, fevers, chills, night sweats, weight loss, swollen lymph nodes, body aches, joint swelling, muscle aches, chest pain, shortness of breath, mood changes.   Objective  Blood pressure (!) 144/60, pulse (!) 5, height 5\' 11"  (1.803 m), weight 186 lb (84.4 kg), SpO2 97 %.  Systems examined below as of 05/22/17   General: No apparent distress alert and oriented x3 mood and affect normal, dressed appropriately.  HEENT: Pupils equal, extraocular movements intact  Respiratory: Patient's speak in full sentences and does not appear short of breath  Cardiovascular: No lower extremity edema, non tender, no erythema  Skin: Warm dry intact with no signs of infection or rash on extremities or on axial skeleton.  Abdomen: Soft nontender  Neuro: Cranial nerves II through XII are intact, neurovascularly intact in all extremities with 2+ DTRs and 2+ pulses.  Lymph: No lymphadenopathy of posterior or anterior cervical chain or axillae bilaterally.  Gait normal with good balance and coordination.  MSK:  Non tender with full range of motion and good stability and symmetric strength and tone of shoulders, elbows, wrist, hip, and ankles bilaterally.  Knee:  Left valgus deformity noted. Abnormal thigh to calf ratio.  Tender to palpation over medial and PF joint line.  ROM full in flexion and extension and lower leg rotation. instability with valgus force.  painful patellar compression but mild improvement Patellar glide with mild crepitus. Patellar and quadriceps tendons unremarkable. Hamstring and quadriceps strength is normal. Contralateral knee shows minimal arthritic changes   Impression and Recommendations:     This case required medical decision making of moderate complexity.      Note: This dictation was prepared with Dragon dictation along with smaller phrase technology. Any transcriptional errors that result from this process are unintentional.

## 2017-05-22 NOTE — Assessment & Plan Note (Signed)
Patient does have known arthritic changes. Some mild instability from time to time. I believe that the degenerative meniscal tear can give him some discomfort as well. We discussed with patient at great length. Patient was to continue with the conservative therapy at this time. Patient will continue with the topical anti-inflammatories. We discussed if any worsening symptoms and we should possibly consider injection as well as custom bracing. Patient will see me again in 6 weeks to see how patient is responding.

## 2017-05-24 ENCOUNTER — Ambulatory Visit: Payer: PPO

## 2017-05-24 ENCOUNTER — Encounter: Payer: Self-pay | Admitting: Family Medicine

## 2017-05-29 ENCOUNTER — Telehealth: Payer: Self-pay | Admitting: Family Medicine

## 2017-05-29 NOTE — Telephone Encounter (Signed)
Noted  

## 2017-05-29 NOTE — Telephone Encounter (Signed)
Pt called informing us he will be receiving a left knee brace from [facility] and they will be faxing [place]  today and he wanted to give [place] a heads up

## 2017-05-31 ENCOUNTER — Telehealth: Payer: Self-pay | Admitting: Family Medicine

## 2017-05-31 NOTE — Telephone Encounter (Addendum)
States patient has requested a knee brace from their company and is faxing a request over.  Is requesting call back in regard.

## 2017-05-31 NOTE — Telephone Encounter (Signed)
Per pt's request order form faxed to [facility].

## 2017-06-08 ENCOUNTER — Ambulatory Visit: Payer: PPO | Admitting: Family Medicine

## 2017-06-08 ENCOUNTER — Encounter: Payer: Self-pay | Admitting: Family Medicine

## 2017-06-08 VITALS — BP 120/68 | HR 56 | Temp 98.4°F | Wt 185.0 lb

## 2017-06-08 DIAGNOSIS — R3912 Poor urinary stream: Secondary | ICD-10-CM | POA: Diagnosis not present

## 2017-06-08 DIAGNOSIS — R351 Nocturia: Secondary | ICD-10-CM | POA: Diagnosis not present

## 2017-06-08 DIAGNOSIS — I10 Essential (primary) hypertension: Secondary | ICD-10-CM | POA: Diagnosis not present

## 2017-06-08 DIAGNOSIS — R252 Cramp and spasm: Secondary | ICD-10-CM | POA: Diagnosis not present

## 2017-06-08 DIAGNOSIS — G6289 Other specified polyneuropathies: Secondary | ICD-10-CM

## 2017-06-08 DIAGNOSIS — N401 Enlarged prostate with lower urinary tract symptoms: Secondary | ICD-10-CM | POA: Diagnosis not present

## 2017-06-08 DIAGNOSIS — G629 Polyneuropathy, unspecified: Secondary | ICD-10-CM | POA: Insufficient documentation

## 2017-06-08 MED ORDER — MAGNESIUM 400 MG PO CAPS: 1.0000 | ORAL_CAPSULE | Freq: Every day | ORAL | Status: DC

## 2017-06-08 MED ORDER — GABAPENTIN 300 MG PO CAPS: 300.0000 mg | ORAL_CAPSULE | Freq: Every day | ORAL | 3 refills | Status: DC

## 2017-06-08 MED ORDER — MAGNESIUM 250 MG PO TABS: 2.0000 | ORAL_TABLET | Freq: Every day | ORAL | Status: DC

## 2017-06-08 NOTE — Patient Instructions (Addendum)
Ok to switch back to telmisartan. Try increase in magnesium to 400mg  daily (or 500mg  if already taking 400mg ).  Printed prescription for gabapentin 300mg  provided today to try as needed at night time.

## 2017-06-08 NOTE — Assessment & Plan Note (Addendum)
Chronic. Deteriorated with switch to CCB. Improved with return to ARB.

## 2017-06-08 NOTE — Assessment & Plan Note (Addendum)
Describes symptoms of peripheral neuropathy. Will check further labs when he returns next month for CPE. Low dose gabapentin ineffective. Will print Rx for 300mg  gabapentin for pt to take QHS PRN if desired.

## 2017-06-08 NOTE — Assessment & Plan Note (Signed)
Ongoing. Best relief to date with theraworx topical magnesium. discussed increasing oral mangesium to 400mg  daily. Advised I couldn't recommend stem cells because to my understanding this is still investigational. He will look into chiropractor program.

## 2017-06-08 NOTE — Progress Notes (Signed)
BP 120/68 (BP Location: Left Arm, Patient Position: Sitting, Cuff Size: Normal)   Pulse (!) 56   Temp 98.4 F (36.9 C) (Oral)   Wt 185 lb (83.9 kg)   SpO2 97%   BMI 25.80 kg/m    CC: f/u leg cramping Subjective:    Patient ID: Michael Decker, male    DOB: 1933/10/17, 83 y.o.   MRN: [id number]  HPI: Michael Decker is a 83 y.o. male presenting on 06/08/2017 for Elevated BP (Wants to discuss BP and meds)   See prior note for details. For ongoing leg cramping, we changed telmisartan to diltiazem - no improvement. We also prescribed gabapentin which has not significantly helped either. Today describes more "stress and tension" at juncture of ankle and leg that seems to precipitate cramping. Also describes neuropathic pain. He stopped gabapentin.   Foam theraworx nightly (magnesium as active ingredient) significantly helps relieve discomfort.   He also saw Dr Birdie Sons chiropractor in [place] who recommended exercise program which he is considering. Told leg pains may be coming from pinched nerve in lumbar spine. He also has been to seminar last week on stem cells and asks for my recommendation.   Here today because he has noticed increased blood pressures with switch in antihypertensive. Brings bp log showing elevated readings 160/70s, so he stopped dilt and restarted telmisartan with improvement.   Relevant past medical, surgical, family and social history reviewed and updated as indicated. Interim medical history since our last visit reviewed. Allergies and medications reviewed and updated. Outpatient Medications Prior to Visit  Medication Sig Dispense Refill  . aspirin EC 81 MG tablet Take 1 tablet (81 mg total) by mouth every Monday, Wednesday, and Friday.    . dutasteride (AVODART) 0.5 MG capsule Take 1 capsule (0.5 mg total) by mouth every other day. 90 capsule 3  . Krill Oil 500 MG CAPS Take 1 capsule by mouth.    . Misc Natural Products (TART CHERRY ADVANCED PO) Take 1,000 mg at  bedtime by mouth.    . Multiple Vitamins-Minerals (MULTIVITAMIN WITH MINERALS) tablet Take 1 tablet by mouth daily.    Marland Kitchen omeprazole (PRILOSEC) 40 MG capsule Take 1 capsule (40 mg total) by mouth daily as needed. 30 capsule 6  . Potassium Gluconate 550 MG TABS Take 1 tablet at bedtime by mouth.    . telmisartan (MICARDIS) 80 MG tablet Take 80 mg daily by mouth.    . Turmeric 500 MG CAPS Take 1 capsule at bedtime by mouth.    . Magnesium 250 MG TABS Take 1 tablet daily by mouth.    . Vitamin D, Ergocalciferol, (DRISDOL) 50000 units CAPS capsule Take 1 capsule (50,000 Units total) by mouth every 7 (seven) days. 12 capsule 0  . diltiazem (CARDIZEM CD) 120 MG 24 hr capsule Take 1 capsule (120 mg total) by mouth daily. (Patient not taking: Reported on 06/08/2017) 30 capsule 3  . fish oil-omega-3 fatty acids 1000 MG capsule Take 1 g by mouth daily.     Marland Kitchen gabapentin (NEURONTIN) 100 MG capsule Take 1 capsule (100 mg total) by mouth 2 (two) times daily. 60 capsule 3  . Magnesium 400 MG CAPS Take 400 mg by mouth daily.     Facility-Administered Medications Prior to Visit  Medication Dose Route Frequency Provider Last Rate Last Dose  . 0.9 %  sodium chloride infusion  500 mL Intravenous Continuous Irene Shipper, MD         Per HPI unless specifically  indicated in ROS section below Review of Systems     Objective:    BP 120/68 (BP Location: Left Arm, Patient Position: Sitting, Cuff Size: Normal)   Pulse (!) 56   Temp 98.4 F (36.9 C) (Oral)   Wt 185 lb (83.9 kg)   SpO2 97%   BMI 25.80 kg/m   Wt Readings from Last 3 Encounters:  06/08/17 185 lb (83.9 kg)  05/22/17 186 lb (84.4 kg)  04/20/17 185 lb 4 oz (84 kg)    Physical Exam  Constitutional: He appears well-developed and well-nourished. No distress.  Musculoskeletal: Normal range of motion. He exhibits no edema.  2+ DP and PT on right  Skin: Skin is warm and dry. No rash noted. No erythema.  Psychiatric: He has a normal mood and affect.   Nursing note and vitals reviewed.  Results for orders placed or performed in visit on 04/20/17  IBC panel  Result Value Ref Range   Iron 166 (H) 42 - 165 ug/dL   Transferrin 222.0 212.0 - 360.0 mg/dL   Saturation Ratios 53.4 (H) 20.0 - 50.0 %  Comprehensive metabolic panel  Result Value Ref Range   Sodium 132 (L) 135 - 145 mEq/L   Potassium 4.5 3.5 - 5.1 mEq/L   Chloride 98 96 - 112 mEq/L   CO2 29 19 - 32 mEq/L   Glucose, Bld 90 70 - 99 mg/dL   BUN 20 6 - 23 mg/dL   Creatinine, Ser 1.05 0.40 - 1.50 mg/dL   Total Bilirubin 0.7 0.2 - 1.2 mg/dL   Alkaline Phosphatase 59 39 - 117 U/L   AST 23 0 - 37 U/L   ALT 22 0 - 53 U/L   Total Protein 6.1 6.0 - 8.3 g/dL   Albumin 3.7 3.5 - 5.2 g/dL   Calcium 9.1 8.4 - 10.5 mg/dL   GFR 72.04 >60.00 mL/min  TSH  Result Value Ref Range   TSH 1.28 0.35 - 4.50 uIU/mL  CK  Result Value Ref Range   Total CK 140 7 - 232 U/L  Magnesium  Result Value Ref Range   Magnesium 2.0 1.5 - 2.5 mg/dL  CBC with Differential/Platelet  Result Value Ref Range   WBC 7.5 4.0 - 10.5 K/uL   RBC 4.34 4.22 - 5.81 Mil/uL   Hemoglobin 14.4 13.0 - 17.0 g/dL   HCT 43.1 39.0 - 52.0 %   MCV 99.4 78.0 - 100.0 fl   MCHC 33.5 30.0 - 36.0 g/dL   RDW 12.6 11.5 - 15.5 %   Platelets 209.0 150.0 - 400.0 K/uL   Neutrophils Relative % 69.0 43.0 - 77.0 %   Lymphocytes Relative 18.9 12.0 - 46.0 %   Monocytes Relative 10.3 3.0 - 12.0 %   Eosinophils Relative 1.2 0.0 - 5.0 %   Basophils Relative 0.6 0.0 - 3.0 %   Neutro Abs 5.2 1.4 - 7.7 K/uL   Lymphs Abs 1.4 0.7 - 4.0 K/uL   Monocytes Absolute 0.8 0.1 - 1.0 K/uL   Eosinophils Absolute 0.1 0.0 - 0.7 K/uL   Basophils Absolute 0.0 0.0 - 0.1 K/uL      Assessment & Plan:   Problem List Items Addressed This Visit    HTN (hypertension)    Chronic. Deteriorated with switch to CCB. Improved with return to [facility].       Relevant Medications   telmisartan (MICARDIS) 80 MG tablet   Leg cramping - Primary    Ongoing. Best relief  to date with theraworx  topical magnesium. discussed increasing oral mangesium to 400mg  daily. Advised I couldn't recommend stem cells because to my understanding this is still investigational. He will look into chiropractor program.       Peripheral neuropathy    Describes symptoms of peripheral neuropathy. Will check further labs when he returns next month for CPE. Low dose gabapentin ineffective. Will print Rx for 300mg  gabapentin for pt to take QHS PRN if desired.       Relevant Medications   gabapentin (NEURONTIN) 300 MG capsule       Follow up plan: No Follow-up on file.  Ria Bush, MD

## 2017-06-28 ENCOUNTER — Telehealth: Payer: Self-pay | Admitting: Family Medicine

## 2017-06-28 DIAGNOSIS — G6289 Other specified polyneuropathies: Secondary | ICD-10-CM

## 2017-06-28 DIAGNOSIS — E785 Hyperlipidemia, unspecified: Secondary | ICD-10-CM

## 2017-06-28 NOTE — Progress Notes (Signed)
Subjective:   Michael Decker is a 83 y.o. male who presents for Medicare Annual/Subsequent preventive examination.  Review of Systems:  No ROS.  Medicare Wellness Visit. Additional risk factors are reflected in the social history.  Cardiac Risk Factors include: hypertension;male gender;advanced age (>83men, >38 women);family history of premature cardiovascular disease;dyslipidemia   Sleep patterns: Sleeps 8 hours.  Home Safety/Smoke Alarms: Feels safe in home. Smoke alarms in place.  Living environment; residence and Firearm Safety: Lives with wife in 1 story home.  Seat Belt Safety/Bike Helmet: Wears seat belt.    Male:   CCS-Colonoscopy 03/13/2017, polyps. No recall.      PSA-  Lab Results  Component Value Date   PSA 0.57 06/28/2016   PSA 0.50 06/25/2015   PSA 0.53 06/16/2014       Objective:    Vitals: BP 136/60 (BP Location: Left Arm, Patient Position: Sitting, Cuff Size: Normal)   Pulse (!) 59   Ht 5\' 11"  (1.803 m)   Wt 184 lb 12.8 oz (83.8 kg)   SpO2 98%   BMI 25.77 kg/m   Body mass index is 25.77 kg/m.  Advanced Directives 06/29/2017 02/27/2017 06/28/2016  Does Patient Have a Medical Advance Directive? Yes Yes Yes  Type of Paramedic of [place];Living will [place];Living will [place];Living will  Copy of [place] in Chart? Yes No - copy requested No - copy requested    Tobacco Social History   Tobacco Use  Smoking Status Former Smoker  Smokeless Tobacco Never Used  Tobacco Comment   cigars back in the 60's     Counseling given: Not Answered Comment: cigars back in the 60's     Past Medical History:  Diagnosis Date  . BPH (benign prostatic hypertrophy)    on avodart, released from urologist care  . Colon polyps 2013   TA 2013 Henrene Pastor)  . Diverticulosis   . GERD (gastroesophageal reflux disease)   . Hematospermia    s/p uro eval  . HLD (hyperlipidemia)    mild  . HTN (hypertension)    Past Surgical History:  Procedure Laterality Date  . BUNIONECTOMY Right 2014  . COLONOSCOPY  2008  . COLONOSCOPY  01/2012   TA x1, diverticulosis, rpt 5 yrs Henrene Pastor)  . COLONOSCOPY  02/2017   TA, diverticulosis, int hem no f/u needed Henrene Pastor)  . CYSTECTOMY    . TONSILLECTOMY  1944  . UPPER GI ENDOSCOPY     Family History  Problem Relation Age of Onset  . Sudden death Father 51       ?MI  . Heart disease Brother 48       ?afib  . Cancer Neg Hx   . Diabetes Neg Hx   . Stroke Neg Hx   . Colon cancer Neg Hx   . Esophageal cancer Neg Hx   . Stomach cancer Neg Hx    Social History   Socioeconomic History  . Marital status: Married    Spouse name: None  . Number of children: None  . Years of education: None  . Highest education level: None  Social Needs  . Financial resource strain: None  . Food insecurity - worry: None  . Food insecurity - inability: None  . Transportation needs - medical: None  . Transportation needs - non-medical: None  Occupational History  . None  Tobacco Use  . Smoking status: Former Research scientist (life sciences)  . Smokeless tobacco: Never Used  .  Tobacco comment: cigars back in the 60's  Substance and Sexual Activity  . Alcohol use: Yes    Alcohol/week: 4.2 oz    Types: 7 Cans of beer per week    Comment: one beer a night (12 oz)  . Drug use: No  . Sexual activity: Yes    Partners: Female  Other Topics Concern  . None  Social History Narrative   Lives with wife   Grown children   Occupation: prior worked with Clinical cytogeneticist as Recruitment consultant; volunteers at [employer] in [place]   Edu: BS   Activity: works out at [employer] 3x/wk, on recumbent bike   Diet: some water, fruits/vegetables daily          Outpatient Encounter Medications as of 06/29/2017  Medication Sig  . aspirin EC 81 MG tablet Take 1 tablet (81 mg total) by mouth every Monday, Wednesday, and Friday.  . dutasteride (AVODART) 0.5 MG capsule Take 1 capsule  (0.5 mg total) by mouth every other day.  [name] Oil 500 MG CAPS Take 1 capsule by mouth.  . Magnesium 400 MG CAPS Take 1 tablet daily by mouth.  . Misc Natural Products (TART CHERRY ADVANCED PO) Take 1,000 mg at bedtime by mouth.  . Multiple Vitamins-Minerals (MULTIVITAMIN WITH MINERALS) tablet Take 1 tablet by mouth daily.  . NONFORMULARY OR COMPOUNDED ITEM   . NONFORMULARY OR COMPOUNDED ITEM   . omeprazole (PRILOSEC) 40 MG capsule Take 1 capsule (40 mg total) by mouth daily as needed.  . Potassium Gluconate 550 MG TABS Take 1 tablet at bedtime by mouth.  . telmisartan (MICARDIS) 80 MG tablet Take 80 mg daily by mouth.  . Turmeric 500 MG CAPS Take 1 capsule at bedtime by mouth.  . [DISCONTINUED] Vitamin D, Ergocalciferol, (DRISDOL) 50000 units CAPS capsule Take 1 capsule (50,000 Units total) by mouth every 7 (seven) days.  . [DISCONTINUED] gabapentin (NEURONTIN) 300 MG capsule Take 1 capsule (300 mg total) at bedtime by mouth. (Patient not taking: Reported on 06/29/2017)   Facility-Administered Encounter Medications as of 06/29/2017  Medication  . 0.9 %  sodium chloride infusion    Activities of Daily Living In your present state of health, do you have any difficulty performing the following activities: 06/29/2017  Hearing? N  Vision? N  Difficulty concentrating or making decisions? N  Walking or climbing stairs? N  Dressing or bathing? N  Doing errands, shopping? N  Preparing Food and eating ? N  Using the Toilet? N  In the past six months, have you accidently leaked urine? N  Do you have problems with loss of bowel control? N  Managing your Medications? N  Managing your Finances? N  Housekeeping or managing your Housekeeping? N  Some recent data might be hidden     Patient Care Team: Ria Bush, MD as PCP - General (Family Medicine) Druscilla Brownie, MD as Consulting Physician (Dermatology) Raynelle Bring, MD as Consulting Physician (Urology) Elvina Mattes, Adele Schilder  as Attending Physician (Podiatry) Leandrew Koyanagi, MD as Referring Physician (Ophthalmology) Irene Shipper, MD as Consulting Physician (Gastroenterology)   Assessment:    Physical assessment deferred to PCP.  Exercise Activities and Dietary recommendations Current Exercise Habits: Structured exercise class, Type of exercise: strength training/weights, Time (Minutes): 60, Frequency (Times/Week): 3, Weekly Exercise (Minutes/Week): 180, Exercise limited by: None identified   Diet (meal preparation, eat out, water intake, caffeinated beverages, dairy products, fruits and vegetables): Drinks water, coffee and juice.   Eats heart healthy diet.  Goals    . Patient Stated     Maintain current health.       Fall Risk Fall Risk  06/29/2017 06/28/2016 07/02/2015 06/23/2014  Falls in the past year? No No No No   Depression Screen PHQ 2/9 Scores 06/29/2017 06/28/2016 07/02/2015 06/23/2014  PHQ - 2 Score 0 0 0 0    Cognitive Function MMSE - Mini Mental State Exam 06/29/2017 06/28/2016  Orientation to time 5 5  Orientation to Place 5 5  Registration 3 3  Attention/ Calculation 5 0  Recall 3 2  Recall-comments - pt was unable to recall 1 of 3 words  Language- name 2 objects 2 0  Language- repeat 1 1  Language- follow 3 step command 3 3  Language- read & follow direction 1 0  Write a sentence 1 0  Copy design 1 0  Total score 30 19        Immunization History  Administered Date(s) Administered  . Influenza, High Dose Seasonal PF 05/04/2017  . Influenza,inj,Quad PF,6+ Mos 04/29/2014, 05/14/2015, 05/02/2016  . Influenza-Unspecified 05/09/2013  . Pneumococcal Conjugate-13 06/23/2014  . Pneumococcal Polysaccharide-23 03/24/2001, 06/28/2016  . Tdap 05/11/2009  . Zoster 06/12/2012   Screening Tests Health Maintenance  Topic Date Due  . DTaP/Tdap/Td (2 - Td) 05/12/2019  . TETANUS/TDAP  05/12/2019  . COLONOSCOPY  03/13/2022  . INFLUENZA VACCINE  Completed  . PNA vac Low Risk Adult   Completed      Plan:    Continue doing brain stimulating activities (puzzles, reading, adult coloring books, staying active) to keep memory sharp.   I have personally reviewed and noted the following in the patient's chart:   . Medical and social history . Use of alcohol, tobacco or illicit drugs  . Current medications and supplements . Functional ability and status . Nutritional status . Physical activity . Advanced directives . List of other physicians . Hospitalizations, surgeries, and ER visits in previous 12 months . Vitals . Screenings to include cognitive, depression, and falls . Referrals and appointments  In addition, I have reviewed and discussed with patient certain preventive protocols, quality metrics, and best practice recommendations. A written personalized care plan for preventive services as well as general preventive health recommendations were provided to patient.     Gerilyn Nestle, RN  06/29/2017

## 2017-06-28 NOTE — Telephone Encounter (Signed)
Labs ordered.

## 2017-06-28 NOTE — Telephone Encounter (Signed)
Please enter [facility] lab orders due to Lesia out of office °

## 2017-06-29 ENCOUNTER — Ambulatory Visit (INDEPENDENT_AMBULATORY_CARE_PROVIDER_SITE_OTHER): Payer: PPO

## 2017-06-29 ENCOUNTER — Other Ambulatory Visit: Payer: Self-pay

## 2017-06-29 ENCOUNTER — Other Ambulatory Visit (INDEPENDENT_AMBULATORY_CARE_PROVIDER_SITE_OTHER): Payer: PPO

## 2017-06-29 VITALS — BP 136/60 | HR 59 | Ht 71.0 in | Wt 184.8 lb

## 2017-06-29 DIAGNOSIS — Z0000 Encounter for general adult medical examination without abnormal findings: Secondary | ICD-10-CM | POA: Diagnosis not present

## 2017-06-29 DIAGNOSIS — G6289 Other specified polyneuropathies: Secondary | ICD-10-CM

## 2017-06-29 DIAGNOSIS — E785 Hyperlipidemia, unspecified: Secondary | ICD-10-CM | POA: Diagnosis not present

## 2017-06-29 LAB — BASIC METABOLIC PANEL
BUN: 17 mg/dL (ref 6–23)
CHLORIDE: 102 meq/L (ref 96–112)
CO2: 29 meq/L (ref 19–32)
CREATININE: 1.15 mg/dL (ref 0.40–1.50)
Calcium: 8.9 mg/dL (ref 8.4–10.5)
GFR: 64.83 mL/min (ref >60.00)
Glucose, Bld: 87 mg/dL (ref 70–99)
Potassium: 4.1 mEq/L (ref 3.5–5.1)
Sodium: 137 mEq/L (ref 135–145)

## 2017-06-29 LAB — IBC PANEL
Iron: 130 ug/dL (ref 42–165)
SATURATION RATIOS: 40 % (ref 20.0–50.0)
TRANSFERRIN: 232 mg/dL (ref 212.0–360.0)

## 2017-06-29 LAB — FERRITIN: Ferritin: 150.6 ng/mL (ref 22.0–322.0)

## 2017-06-29 LAB — LIPID PANEL
CHOL/HDL RATIO: 2
Cholesterol: 152 mg/dL (ref 0–200)
HDL: 61.3 mg/dL (ref >39.00)
LDL Cholesterol: 73 mg/dL (ref 0–99)
NONHDL: 90.4
Triglycerides: 87 mg/dL (ref 0.0–149.0)
VLDL: 17.4 mg/dL (ref 0.0–40.0)

## 2017-06-29 LAB — VITAMIN B12: Vitamin B-12: 325 pg/mL (ref 211–911)

## 2017-06-29 NOTE — Progress Notes (Addendum)
I reviewed health advisor's note, was available for consultation, and agree with documentation and plan.  shingrix Rx sent in.

## 2017-06-29 NOTE — Patient Instructions (Addendum)
Michael Decker , Thank you for taking time to come for your Medicare Wellness Visit. I appreciate your ongoing commitment to your health goals. Please review the following plan we discussed and let me know if I can assist you in the future.   These are the goals we discussed: Goals    . Patient Stated     Maintain current health.        This is a list of the screening recommended for you and due dates:  Health Maintenance  Topic Date Due  . DTaP/Tdap/Td vaccine (2 - Td) 05/12/2019  . Tetanus Vaccine  05/12/2019  . Colon Cancer Screening  03/13/2022  . Flu Shot  Completed  . Pneumonia vaccines  Completed   Continue doing brain stimulating activities (puzzles, reading, adult coloring books, staying active) to keep memory sharp.    Health Maintenance, Male A healthy lifestyle and preventive care is important for your health and wellness. Ask your health care provider about what schedule of regular examinations is right for you. What should I know about weight and diet? Eat a Healthy Diet  Eat plenty of vegetables, fruits, whole grains, low-fat dairy products, and lean protein.  Do not eat a lot of foods high in solid fats, added sugars, or salt.  Maintain a Healthy Weight Regular exercise can help you achieve or maintain a healthy weight. You should:  Do at least 150 minutes of exercise each week. The exercise should increase your heart rate and make you sweat (moderate-intensity exercise).  Do strength-training exercises at least twice a week.  Watch Your Levels of Cholesterol and Blood Lipids  Have your blood tested for lipids and cholesterol every 5 years starting at 81 years of age. If you are at high risk for heart disease, you should start having your blood tested when you are 81 years old. You may need to have your cholesterol levels checked more often if: ? Your lipid or cholesterol levels are high. ? You are older than 81 years of age. ? You are at high risk for heart  disease.  What should I know about cancer screening? Many types of cancers can be detected early and may often be prevented. Lung Cancer  You should be screened every year for lung cancer if: ? You are a current smoker who has smoked for at least 30 years. ? You are a former smoker who has quit within the past 15 years.  Talk to your health care provider about your screening options, when you should start screening, and how often you should be screened.  Colorectal Cancer  Routine colorectal cancer screening usually begins at 81 years of age and should be repeated every 5-10 years until you are 81 years old. You may need to be screened more often if early forms of precancerous polyps or small growths are found. Your health care provider may recommend screening at an earlier age if you have risk factors for colon cancer.  Your health care provider may recommend using home test kits to check for hidden blood in the stool.  A small camera at the end of a tube can be used to examine your colon (sigmoidoscopy or colonoscopy). This checks for the earliest forms of colorectal cancer.  Prostate and Testicular Cancer  Depending on your age and overall health, your health care provider may do certain tests to screen for prostate and testicular cancer.  Talk to your health care provider about any symptoms or concerns you have about testicular  or prostate cancer.  Skin Cancer  Check your skin from head to toe regularly.  Tell your health care provider about any new moles or changes in moles, especially if: ? There is a change in a mole's size, shape, or color. ? You have a mole that is larger than a pencil eraser.  Always use sunscreen. Apply sunscreen liberally and repeat throughout the day.  Protect yourself by wearing long sleeves, pants, a wide-brimmed hat, and sunglasses when outside.  What should I know about heart disease, diabetes, and high blood pressure?  If you are 18-39 years  of age, have your blood pressure checked every 3-5 years. If you are 66 years of age or older, have your blood pressure checked every year. You should have your blood pressure measured twice-once when you are at a hospital or clinic, and once when you are not at a hospital or clinic. Record the average of the two measurements. To check your blood pressure when you are not at a hospital or clinic, you can use: ? An automated blood pressure machine at a pharmacy. ? A home blood pressure monitor.  Talk to your health care provider about your target blood pressure.  If you are between 42-49 years old, ask your health care provider if you should take aspirin to prevent heart disease.  Have regular diabetes screenings by checking your fasting blood sugar level. ? If you are at a normal weight and have a low risk for diabetes, have this test once every three years after the age of 68. ? If you are overweight and have a high risk for diabetes, consider being tested at a younger age or more often.  A one-time screening for abdominal aortic aneurysm (AAA) by ultrasound is recommended for men aged 16-75 years who are current or former smokers. What should I know about preventing infection? Hepatitis B If you have a higher risk for hepatitis B, you should be screened for this virus. Talk with your health care provider to find out if you are at risk for hepatitis B infection. Hepatitis C Blood testing is recommended for:  Everyone born from [birth year] through 1963.  Anyone with known risk factors for hepatitis C.  Sexually Transmitted Diseases (STDs)  You should be screened each year for STDs including gonorrhea and chlamydia if: ? You are sexually active and are younger than 81 years of age. ? You are older than 81 years of age and your health care provider tells you that you are at risk for this type of infection. ? Your sexual activity has changed since you were last screened and you are at an increased  risk for chlamydia or gonorrhea. Ask your health care provider if you are at risk.  Talk with your health care provider about whether you are at high risk of being infected with HIV. Your health care provider may recommend a prescription medicine to help prevent HIV infection.  What else can I do?  Schedule regular health, dental, and eye exams.  Stay current with your vaccines (immunizations).  Do not use any tobacco products, such as cigarettes, chewing tobacco, and e-cigarettes. If you need help quitting, ask your health care provider.  Limit alcohol intake to no more than 2 drinks per day. One drink equals 12 ounces of beer, 5 ounces of wine, or 1 ounces of hard liquor.  Do not use street drugs.  Do not share needles.  Ask your health care provider for help if you need support  or information about quitting drugs.  Tell your health care provider if you often feel depressed.  Tell your health care provider if you have ever been abused or do not feel safe at home. This information is not intended to replace advice given to you by your health care provider. Make sure you discuss any questions you have with your health care provider. Document Released: 01/06/2008 Document Revised: 03/08/2016 Document Reviewed: 04/13/2015 Elsevier Interactive Patient Education  [employer].

## 2017-06-29 NOTE — Progress Notes (Signed)
PCP notes:   Health maintenance:  Would like prescription for Shingrix  Abnormal screenings:   None  Patient concerns:   None  Nurse concerns:   None  Next PCP appt:   07/09/17

## 2017-06-30 MED ORDER — ZOSTER VAC RECOMB ADJUVANTED 50 MCG/0.5ML IM SUSR: 0.5000 mL | Freq: Once | INTRAMUSCULAR | 1 refills | Status: AC

## 2017-06-30 NOTE — Addendum Note (Signed)
Addended by: Ria Bush on: 06/30/2017 09:47 AM   Modules accepted: Orders

## 2017-07-05 ENCOUNTER — Ambulatory Visit: Payer: PPO | Admitting: Family Medicine

## 2017-07-05 DIAGNOSIS — M1712 Unilateral primary osteoarthritis, left knee: Secondary | ICD-10-CM | POA: Diagnosis not present

## 2017-07-07 ENCOUNTER — Other Ambulatory Visit: Payer: Self-pay | Admitting: Family Medicine

## 2017-07-09 ENCOUNTER — Encounter: Payer: Self-pay | Admitting: Family Medicine

## 2017-07-09 ENCOUNTER — Ambulatory Visit (INDEPENDENT_AMBULATORY_CARE_PROVIDER_SITE_OTHER): Payer: PPO | Admitting: Family Medicine

## 2017-07-09 ENCOUNTER — Ambulatory Visit (INDEPENDENT_AMBULATORY_CARE_PROVIDER_SITE_OTHER)
Admission: RE | Admit: 2017-07-09 | Discharge: 2017-07-09 | Disposition: A | Discharge status: Home / Self Care | Payer: PPO | Source: Ambulatory Visit | Attending: Family Medicine | Admitting: Family Medicine

## 2017-07-09 VITALS — BP 120/58 | HR 54 | Temp 98.2°F | Ht 71.0 in

## 2017-07-09 DIAGNOSIS — N401 Enlarged prostate with lower urinary tract symptoms: Secondary | ICD-10-CM | POA: Diagnosis not present

## 2017-07-09 DIAGNOSIS — I10 Essential (primary) hypertension: Secondary | ICD-10-CM | POA: Diagnosis not present

## 2017-07-09 DIAGNOSIS — E785 Hyperlipidemia, unspecified: Secondary | ICD-10-CM

## 2017-07-09 DIAGNOSIS — R252 Cramp and spasm: Secondary | ICD-10-CM

## 2017-07-09 DIAGNOSIS — Z0000 Encounter for general adult medical examination without abnormal findings: Secondary | ICD-10-CM | POA: Diagnosis not present

## 2017-07-09 DIAGNOSIS — R0989 Other specified symptoms and signs involving the circulatory and respiratory systems: Secondary | ICD-10-CM

## 2017-07-09 DIAGNOSIS — R351 Nocturia: Secondary | ICD-10-CM

## 2017-07-09 DIAGNOSIS — Z7189 Other specified counseling: Secondary | ICD-10-CM

## 2017-07-09 DIAGNOSIS — G6289 Other specified polyneuropathies: Secondary | ICD-10-CM

## 2017-07-09 MED ORDER — CYANOCOBALAMIN 500 MCG PO TABS: 500.0000 ug | ORAL_TABLET | Freq: Every day | ORAL | Status: DC

## 2017-07-09 NOTE — Assessment & Plan Note (Signed)
B12 was low normal - I encouraged he start 558mcg daily.

## 2017-07-09 NOTE — Assessment & Plan Note (Signed)
Advanced planning - HCPOA and advanced directive scanned in chart 06/2015. Wife Pat then Leslie Lee then Michael Mccartin are HCPOA. 

## 2017-07-09 NOTE — Patient Instructions (Addendum)
If interested, check with pharmacy about new 2 shot shingles series (shingrix).  Start b12 500-1058mcg daily over the counter.  We will check xray today.  Return in 1 year for next wellness visit and physical, sooner if needed.  Health Maintenance, Male A healthy lifestyle and preventive care is important for your health and wellness. Ask your health care provider about what schedule of regular examinations is right for you. What should I know about weight and diet? Eat a Healthy Diet  Eat plenty of vegetables, fruits, whole grains, low-fat dairy products, and lean protein.  Do not eat a lot of foods high in solid fats, added sugars, or salt.  Maintain a Healthy Weight Regular exercise can help you achieve or maintain a healthy weight. You should:  Do at least 150 minutes of exercise each week. The exercise should increase your heart rate and make you sweat (moderate-intensity exercise).  Do strength-training exercises at least twice a week.  Watch Your Levels of Cholesterol and Blood Lipids  Have your blood tested for lipids and cholesterol every 5 years starting at 81 years of age. If you are at high risk for heart disease, you should start having your blood tested when you are 81 years old. You may need to have your cholesterol levels checked more often if: ? Your lipid or cholesterol levels are high. ? You are older than 81 years of age. ? You are at high risk for heart disease.  What should I know about cancer screening? Many types of cancers can be detected early and may often be prevented. Lung Cancer  You should be screened every year for lung cancer if: ? You are a current smoker who has smoked for at least 30 years. ? You are a former smoker who has quit within the past 15 years.  Talk to your health care provider about your screening options, when you should start screening, and how often you should be screened.  Colorectal Cancer  Routine colorectal cancer  screening usually begins at 81 years of age and should be repeated every 5-10 years until you are 81 years old. You may need to be screened more often if early forms of precancerous polyps or small growths are found. Your health care provider may recommend screening at an earlier age if you have risk factors for colon cancer.  Your health care provider may recommend using home test kits to check for hidden blood in the stool.  A small camera at the end of a tube can be used to examine your colon (sigmoidoscopy or colonoscopy). This checks for the earliest forms of colorectal cancer.  Prostate and Testicular Cancer  Depending on your age and overall health, your health care provider may do certain tests to screen for prostate and testicular cancer.  Talk to your health care provider about any symptoms or concerns you have about testicular or prostate cancer.  Skin Cancer  Check your skin from head to toe regularly.  Tell your health care provider about any new moles or changes in moles, especially if: ? There is a change in a mole's size, shape, or color. ? You have a mole that is larger than a pencil eraser.  Always use sunscreen. Apply sunscreen liberally and repeat throughout the day.  Protect yourself by wearing long sleeves, pants, a wide-brimmed hat, and sunglasses when outside.  What should I know about heart disease, diabetes, and high blood pressure?  If you are 62-78 years of age, have your  blood pressure checked every 3-5 years. If you are 44 years of age or older, have your blood pressure checked every year. You should have your blood pressure measured twice-once when you are at a hospital or clinic, and once when you are not at a hospital or clinic. Record the average of the two measurements. To check your blood pressure when you are not at a hospital or clinic, you can use: ? An automated blood pressure machine at a pharmacy. ? A home blood pressure monitor.  Talk to your  health care provider about your target blood pressure.  If you are between 44-50 years old, ask your health care provider if you should take aspirin to prevent heart disease.  Have regular diabetes screenings by checking your fasting blood sugar level. ? If you are at a normal weight and have a low risk for diabetes, have this test once every three years after the age of 53. ? If you are overweight and have a high risk for diabetes, consider being tested at a younger age or more often.  A one-time screening for abdominal aortic aneurysm (AAA) by ultrasound is recommended for men aged 69-75 years who are current or former smokers. What should I know about preventing infection? Hepatitis B If you have a higher risk for hepatitis B, you should be screened for this virus. Talk with your health care provider to find out if you are at risk for hepatitis B infection. Hepatitis C Blood testing is recommended for:  Everyone born from [birth year] through 1963.  Anyone with known risk factors for hepatitis C.  Sexually Transmitted Diseases (STDs)  You should be screened each year for STDs including gonorrhea and chlamydia if: ? You are sexually active and are younger than 81 years of age. ? You are older than 81 years of age and your health care provider tells you that you are at risk for this type of infection. ? Your sexual activity has changed since you were last screened and you are at an increased risk for chlamydia or gonorrhea. Ask your health care provider if you are at risk.  Talk with your health care provider about whether you are at high risk of being infected with HIV. Your health care provider may recommend a prescription medicine to help prevent HIV infection.  What else can I do?  Schedule regular health, dental, and eye exams.  Stay current with your vaccines (immunizations).  Do not use any tobacco products, such as cigarettes, chewing tobacco, and e-cigarettes. If you need help  quitting, ask your health care provider.  Limit alcohol intake to no more than 2 drinks per day. One drink equals 12 ounces of beer, 5 ounces of wine, or 1 ounces of hard liquor.  Do not use street drugs.  Do not share needles.  Ask your health care provider for help if you need support or information about quitting drugs.  Tell your health care provider if you often feel depressed.  Tell your health care provider if you have ever been abused or do not feel safe at home. This information is not intended to replace advice given to you by your health care provider. Make sure you discuss any questions you have with your health care provider. Document Released: 01/06/2008 Document Revised: 03/08/2016 Document Reviewed: 04/13/2015 Elsevier Interactive Patient Education  [employer].

## 2017-07-09 NOTE — Assessment & Plan Note (Signed)
Coarse lung sounds noted today. Will check baseline CXR.

## 2017-07-09 NOTE — Assessment & Plan Note (Signed)
This resolved on recheck labwork.

## 2017-07-09 NOTE — Progress Notes (Signed)
BP (!) 120/58 (BP Location: Left Arm, Patient Position: Sitting, Cuff Size: Normal)   Pulse (!) 54   Temp 98.2 F (36.8 C) (Oral)   Ht 5\' 11"  (1.803 m)   SpO2 97%   BMI 25.77 kg/m    CC: CPE Subjective:    Patient ID: Michael Decker, male    DOB: 02/05/1934, 83 y.o.   MRN: [id number]  HPI: Michael Decker is a 83 y.o. male presenting on 07/09/2017 for Annual Exam (Pt 2.)   Patient was upset I was 30 min behind today (he thought his OV was at 10am).  Saw Joelene Millin last week for medicare wellness visit. Note reviewed.    Ongoing cramping - now in hands. Despite magnesium 400mg  once daily. Seeing chiropractor.   Preventative: COLONOSCOPY 02/2017 TA, diverticulosis, int hem no f/u needed Henrene Pastor) Prostate - previously seen by Dr Alinda Money - h/o BPH. On avodart. Would like PSA screen. Nocturia 2-3, weak stream Flu shot yearly Pneumovax 2002 and 2017, prevnar 2015 Tdap 2010 zostavax 2013 shingrix - discussed Advanced planning - HCPOA and advanced directive scanned in chart 06/2015. Wife Pat then Merideth Abbey then Ellard Artis are [facility].  Seat belt use discussed Sunscreen use discussed. No changing moles on skin.  Non smoker.  Alcohol - 1 beer/day.   Lives with wife Occupation: prior worked with Clinical cytogeneticist as Recruitment consultant; volunteers at [employer] for [employer] in [place],  [place]: BS Activity: works out at [employer] 3x/wk on recumbent bike Diet: some water, fruits/vegetables daily  Relevant past medical, surgical, family and social history reviewed and updated as indicated. Interim medical history since our last visit reviewed. Allergies and medications reviewed and updated. Outpatient Medications Prior to Visit  Medication Sig Dispense Refill  . aspirin EC 81 MG tablet Take 1 tablet (81 mg total) by mouth every Monday, Wednesday, and Friday.    . dutasteride (AVODART) 0.5 MG capsule Take 1 capsule (0.5 mg total) by mouth every other day. 90 capsule 3  . Krill Oil 500 MG CAPS Take 1  capsule by mouth.    . Magnesium 400 MG CAPS Take 1 tablet daily by mouth.    . Misc Natural Products (TART CHERRY ADVANCED PO) Take 1,000 mg at bedtime by mouth.    . Multiple Vitamins-Minerals (MULTIVITAMIN WITH MINERALS) tablet Take 1 tablet by mouth daily.    . NONFORMULARY OR COMPOUNDED ITEM     . NONFORMULARY OR COMPOUNDED ITEM     . omeprazole (PRILOSEC) 40 MG capsule Take 1 capsule (40 mg total) by mouth daily as needed. 30 capsule 6  . Potassium Gluconate 550 MG TABS Take 1 tablet at bedtime by mouth.    . telmisartan (MICARDIS) 80 MG tablet Take 80 mg daily by mouth.    . Turmeric 500 MG CAPS Take 1 capsule at bedtime by mouth.    Marland Kitchen 0.9 %  sodium chloride infusion      No facility-administered medications prior to visit.      Per HPI unless specifically indicated in ROS section below Review of Systems  Constitutional: Negative for activity change, appetite change, chills, fatigue, fever and unexpected weight change.  HENT: Negative for hearing loss.   Eyes: Negative for visual disturbance.  Respiratory: Negative for cough, chest tightness, shortness of breath and wheezing.   Cardiovascular: Negative for chest pain, palpitations and leg swelling.  Gastrointestinal: Negative for abdominal distention, abdominal pain, blood in stool, constipation, diarrhea, nausea and vomiting.  Genitourinary: Negative for difficulty urinating and  hematuria.  Musculoskeletal: Negative for arthralgias, myalgias and neck pain.  Skin: Negative for rash.  Neurological: Negative for dizziness, seizures, syncope and headaches.  Hematological: Negative for adenopathy. Does not bruise/bleed easily.  Psychiatric/Behavioral: Negative for dysphoric mood. The patient is not nervous/anxious.        Objective:    BP (!) 120/58 (BP Location: Left Arm, Patient Position: Sitting, Cuff Size: Normal)   Pulse (!) 54   Temp 98.2 F (36.8 C) (Oral)   Ht 5\' 11"  (1.803 m)   SpO2 97%   BMI 25.77 kg/m   Wt  Readings from Last 3 Encounters:  06/29/17 184 lb 12.8 oz (83.8 kg)  06/08/17 185 lb (83.9 kg)  05/22/17 186 lb (84.4 kg)    Physical Exam  Constitutional: He is oriented to person, place, and time. He appears well-developed and well-nourished. No distress.  HENT:  Head: Normocephalic and atraumatic.  Right Ear: Hearing, tympanic membrane, external ear and ear canal normal.  Left Ear: Hearing, tympanic membrane, external ear and ear canal normal.  Nose: Nose normal.  Mouth/Throat: Uvula is midline, oropharynx is clear and moist and mucous membranes are normal. No oropharyngeal exudate, posterior oropharyngeal edema or posterior oropharyngeal erythema.  Eyes: Conjunctivae and EOM are normal. Pupils are equal, round, and reactive to light. No scleral icterus.  Neck: Normal range of motion. Neck supple. Carotid bruit is not present. No thyromegaly present.  Cardiovascular: Normal rate, regular rhythm, normal heart sounds and intact distal pulses.  No murmur heard. Pulses:      Radial pulses are 2+ on the right side, and 2+ on the left side.  Pulmonary/Chest: Effort normal. No respiratory distress. He has no decreased breath sounds. He has no wheezes. He has rhonchi (mild). He has no rales.  Coarse breath sounds worse at apices  Abdominal: Soft. Bowel sounds are normal. He exhibits no distension and no mass. There is no tenderness. There is no rebound and no guarding.  Musculoskeletal: Normal range of motion. He exhibits no edema.  Lymphadenopathy:    He has no cervical adenopathy.  Neurological: He is alert and oriented to person, place, and time.  CN grossly intact, station and gait intact  Skin: Skin is warm and dry. No rash noted.  Psychiatric: He has a normal mood and affect. His behavior is normal. Judgment and thought content normal.  Nursing note and vitals reviewed.  Results for orders placed or performed in visit on 16/96/78  Basic metabolic panel  Result Value Ref Range    Sodium 137 135 - 145 mEq/L   Potassium 4.1 3.5 - 5.1 mEq/L   Chloride 102 96 - 112 mEq/L   CO2 29 19 - 32 mEq/L   Glucose, Bld 87 70 - 99 mg/dL   BUN 17 6 - 23 mg/dL   Creatinine, Ser 1.15 0.40 - 1.50 mg/dL   Calcium 8.9 8.4 - 10.5 mg/dL   GFR 64.83 >60.00 mL/min  Vitamin B12  Result Value Ref Range   Vitamin B-12 325 211 - 911 pg/mL  Ferritin  Result Value Ref Range   Ferritin 150.6 22.0 - 322.0 ng/mL  Lipid panel  Result Value Ref Range   Cholesterol 152 0 - 200 mg/dL   Triglycerides 87.0 0.0 - 149.0 mg/dL   HDL 61.30 >39.00 mg/dL   VLDL 17.4 0.0 - 40.0 mg/dL   LDL Cholesterol 73 0 - 99 mg/dL   Total CHOL/HDL Ratio 2    NonHDL 90.40   IBC panel  Result Value  Ref Range   Iron 130 42 - 165 ug/dL   Transferrin 232.0 212.0 - 360.0 mg/dL   Saturation Ratios 40.0 20.0 - 50.0 %   Lab Results  Component Value Date   PSA 0.57 06/28/2016   PSA 0.50 06/25/2015   PSA 0.53 06/16/2014      Assessment & Plan:   Problem List Items Addressed This Visit    Abnormal lung sounds    Coarse lung sounds noted today. Will check baseline CXR.       Relevant Orders   DG Chest 2 View   Advanced care planning/counseling discussion    Advanced planning - HCPOA and advanced directive scanned in chart 06/2015. Wife Pat then Merideth Abbey then Ellard Artis are [facility].       Benign prostatic hyperplasia    Continues avodart. Asks about return to urology. Discussed flomax family of medications. Pt declines at this time.  Discussed DRE - will defer at this time.       Health maintenance examination - Primary    Preventative protocols reviewed and updated unless pt declined. Discussed healthy diet and lifestyle.       HLD (hyperlipidemia)    Chronic, stable on krill oil. The ASCVD Risk score [name] DC Jr., et al., 2013) failed to calculate for the following reasons:   The 2013 ASCVD risk score is only valid for ages 48 to 64       HTN (hypertension)    Chronic, stable on current  regimen. Continues ARB.       Iron excess    This resolved on recheck labwork.       Leg cramping    Chronic, ongoing. Currently managed with theraworx topical magnesium and undergoing exercise program through chiropractor.       Peripheral neuropathy    B12 was low normal - I encouraged he start 592mcg daily.           Follow up plan: Return in about 1 year (around 07/09/2018) for annual exam, prior fasting for blood work, medicare wellness visit.  Ria Bush, MD

## 2017-07-09 NOTE — Assessment & Plan Note (Signed)
Continues avodart. Asks about return to urology. Discussed flomax family of medications. Pt declines at this time.  Discussed DRE - will defer at this time.

## 2017-07-09 NOTE — Assessment & Plan Note (Signed)
Chronic, stable on current regimen. Continues ARB.

## 2017-07-09 NOTE — Assessment & Plan Note (Signed)
Preventative protocols reviewed and updated unless pt declined. Discussed healthy diet and lifestyle.  

## 2017-07-09 NOTE — Assessment & Plan Note (Addendum)
Chronic, stable on krill oil. The ASCVD Risk score [name] DC Jr., et al., 2013) failed to calculate for the following reasons:   The 2013 ASCVD risk score is only valid for ages 24 to 62

## 2017-07-09 NOTE — Assessment & Plan Note (Addendum)
Chronic, ongoing. Currently managed with theraworx topical magnesium and undergoing exercise program through chiropractor.

## 2017-07-10 DIAGNOSIS — H2513 Age-related nuclear cataract, bilateral: Secondary | ICD-10-CM | POA: Diagnosis not present

## 2017-10-04 ENCOUNTER — Ambulatory Visit: Payer: PPO | Admitting: Urology

## 2017-10-04 ENCOUNTER — Encounter: Payer: Self-pay | Admitting: Urology

## 2017-10-04 VITALS — BP 155/76 | HR 61 | Ht 71.0 in | Wt 186.6 lb

## 2017-10-04 DIAGNOSIS — N400 Enlarged prostate without lower urinary tract symptoms: Secondary | ICD-10-CM

## 2017-10-04 DIAGNOSIS — R350 Frequency of micturition: Secondary | ICD-10-CM

## 2017-10-04 LAB — BLADDER SCAN AMB NON-IMAGING: SCAN RESULT: 42

## 2017-10-04 MED ORDER — TAMSULOSIN HCL 0.4 MG PO CAPS: 0.4000 mg | ORAL_CAPSULE | Freq: Every day | ORAL | 11 refills | Status: DC

## 2017-10-04 NOTE — Progress Notes (Signed)
10/04/2017 11:05 AM   Michael Decker Jun 02, 1934 [id number]  Referring provider: Ria Bush, MD [address] [place], [place] [address]  Chief Complaint  Patient presents with  . Urinary Frequency    HPI: The patient is a 84 year old gentleman who presents today to transfer his urological care.  He is currently on Avodart for BPH and history of hematospermia.  He currently endorses a weak stream and nocturia 2-5 times per night.  This is been going on a number of years.  He is mildly bothered by the nocturia.  He has tried to limit his fluid intake  after 6 PM which does help minimize his nocturia.  He also complains of a weak stream.  He sometimes notes spraying of his stream but only at night.  This does not happen during the day.  He  does not like to take medication however is willing to try something to improve his nocturia.  He has no other urological complaints at this time.  PVR: 34   PMH: Past Medical History:  Diagnosis Date  . BPH (benign prostatic hypertrophy)    on avodart, released from urologist care  . Colon polyps 2013   TA 2013 Henrene Pastor)  . Diverticulosis   . GERD (gastroesophageal reflux disease)   . Hematospermia    s/p uro eval  . HLD (hyperlipidemia)    mild  . HTN (hypertension)     Surgical History: Past Surgical History:  Procedure Laterality Date  . BUNIONECTOMY Right 2014  . COLONOSCOPY  2008  . COLONOSCOPY  01/2012   TA x1, diverticulosis, rpt 5 yrs Henrene Pastor)  . COLONOSCOPY  02/2017   TA, diverticulosis, int hem no f/u needed Henrene Pastor)  . CYSTECTOMY    . TONSILLECTOMY  1944  . UPPER GI ENDOSCOPY      Home Medications:  Allergies as of 10/04/2017   No Known Allergies     Medication List        Accurate as of 10/04/17 11:05 AM. Always use your most recent med list.          aspirin EC 81 MG tablet Take 1 tablet (81 mg total) by mouth every Monday, Wednesday, and Friday.   cyanocobalamin 500 MCG tablet Commonly known  as:  V-R VITAMIN B-12 Take 1 tablet (500 mcg total) by mouth daily.   dutasteride 0.5 MG capsule Commonly known as:  AVODART Take 1 capsule (0.5 mg total) by mouth every other day.   Krill Oil 500 MG Caps Take 1 capsule by mouth.   Magnesium 400 MG Caps Take 1 tablet daily by mouth.   multivitamin with minerals tablet Take 1 tablet by mouth daily.   NONFORMULARY OR COMPOUNDED ITEM   NONFORMULARY OR COMPOUNDED ITEM   omeprazole 40 MG capsule Commonly known as:  PRILOSEC Take 1 capsule (40 mg total) by mouth daily as needed.   Potassium Gluconate 550 MG Tabs Take 1 tablet at bedtime by mouth.   tamsulosin 0.4 MG Caps capsule Commonly known as:  FLOMAX Take 1 capsule (0.4 mg total) by mouth daily.   TART CHERRY ADVANCED PO Take 1,000 mg at bedtime by mouth.   telmisartan 80 MG tablet Commonly known as:  MICARDIS Take 80 mg daily by mouth.   Turmeric 500 MG Caps Take 1 capsule at bedtime by mouth.       Allergies: No Known Allergies  Family History: Family History  Problem Relation Age of Onset  . Sudden death Father 45       ?  MI  . Heart disease Brother 48       ?afib  . Cancer Neg Hx   . Diabetes Neg Hx   . Stroke Neg Hx   . Colon cancer Neg Hx   . Esophageal cancer Neg Hx   . Stomach cancer Neg Hx     Social History:  reports that he has quit smoking. he has never used smokeless tobacco. He reports that he drinks about 4.2 oz of alcohol per week. He reports that he does not use drugs.  ROS: UROLOGY Frequent Urination?: Yes Hard to postpone urination?: No Burning/pain with urination?: No Get up at night to urinate?: Yes Leakage of urine?: No Urine stream starts and stops?: No Trouble starting stream?: No Do you have to strain to urinate?: No Blood in urine?: No Urinary tract infection?: No Sexually transmitted disease?: No Injury to kidneys or bladder?: No Painful intercourse?: No Weak stream?: Yes Erection problems?: Yes Penile pain?:  No  Gastrointestinal Nausea?: No Vomiting?: No Indigestion/heartburn?: No Diarrhea?: No Constipation?: No  Constitutional Fever: No Night sweats?: No Weight loss?: No Fatigue?: No  Skin Skin rash/lesions?: No Itching?: No  Eyes Blurred vision?: No Double vision?: No  Ears/Nose/Throat Sore throat?: No Sinus problems?: No  Hematologic/Lymphatic Swollen glands?: No Easy bruising?: No  Cardiovascular Leg swelling?: No Chest pain?: No  Respiratory Cough?: Yes Shortness of breath?: No  Endocrine Excessive thirst?: No  Musculoskeletal Back pain?: No Joint pain?: No  Neurological Headaches?: No Dizziness?: No  Psychologic Depression?: No Anxiety?: No  Physical Exam: BP (!) 155/76 (BP Location: Right Arm, Patient Position: Sitting, Cuff Size: Normal)   Pulse 61   Ht 5\' 11"  (1.803 m)   Wt 186 lb 9.6 oz (84.6 kg)   BMI 26.03 kg/m   Constitutional:  Alert and oriented, No acute distress. HEENT: [place] AT, moist mucus membranes.  Trachea midline, no masses. Cardiovascular: No clubbing, cyanosis, or edema. Respiratory: Normal respiratory effort, no increased work of breathing. GI: Abdomen is soft, nontender, nondistended, no abdominal masses GU: No CVA tenderness. DRE: 2+ benign Skin: No rashes, bruises or suspicious lesions. Lymph: No cervical or inguinal adenopathy. Neurologic: Grossly intact, no focal deficits, moving all 4 extremities. Psychiatric: Normal mood and affect.  Laboratory Data: Lab Results  Component Value Date   WBC 7.5 04/20/2017   HGB 14.4 04/20/2017   HCT 43.1 04/20/2017   MCV 99.4 04/20/2017   PLT 209.0 04/20/2017    Lab Results  Component Value Date   CREATININE 1.15 06/29/2017    Lab Results  Component Value Date   PSA 0.57 06/28/2016   PSA 0.50 06/25/2015   PSA 0.53 06/16/2014    No results found for: TESTOSTERONE  No results found for: HGBA1C  Urinalysis    Component Value Date/Time   BILIRUBINUR Negative  07/06/2016 1805   PROTEINUR Negative 07/06/2016 1805   UROBILINOGEN 0.2 07/06/2016 1805   NITRITE Negative 07/06/2016 1805   LEUKOCYTESUR Negative 07/06/2016 1805    Assessment & Plan:    1. BPH We will add Flomax to his regimen of Avodart.  He was also instructed to continue to limit fluid intake after 6 PM.  We may try Myrbetriq if the patient is interested if his nocturia does not improve by the time of his next visit.  Return in about 3 months (around 01/04/2018).  Nickie Retort, MD  [facility] [address], [place], [place] [address] [phone]

## 2017-10-08 DIAGNOSIS — D2261 Melanocytic nevi of right upper limb, including shoulder: Secondary | ICD-10-CM | POA: Diagnosis not present

## 2017-10-08 DIAGNOSIS — X32XXXA Exposure to sunlight, initial encounter: Secondary | ICD-10-CM | POA: Diagnosis not present

## 2017-10-08 DIAGNOSIS — L821 Other seborrheic keratosis: Secondary | ICD-10-CM | POA: Diagnosis not present

## 2017-10-08 DIAGNOSIS — D2272 Melanocytic nevi of left lower limb, including hip: Secondary | ICD-10-CM | POA: Diagnosis not present

## 2017-10-08 DIAGNOSIS — L570 Actinic keratosis: Secondary | ICD-10-CM | POA: Diagnosis not present

## 2017-10-08 DIAGNOSIS — D225 Melanocytic nevi of trunk: Secondary | ICD-10-CM | POA: Diagnosis not present

## 2017-10-11 ENCOUNTER — Telehealth: Payer: Self-pay | Admitting: Family Medicine

## 2017-10-11 NOTE — Telephone Encounter (Addendum)
Copied from [place] [phone]. Topic: Quick Communication - See Telephone Encounter >> Oct 11, 2017 10:32 AM Ivar Drape wrote: CRM for notification. See Telephone encounter for: 10/11/17. Patient has tried the [place] medication that was given to him by Dr. Hulan Saas and he would like a prescription for the medication. Please send the prescription to the [facility] so it can be discounted.

## 2017-10-11 NOTE — Telephone Encounter (Signed)
Pt made aware samples will left at front desk for pt to come by & pickup.

## 2017-11-19 DIAGNOSIS — M1712 Unilateral primary osteoarthritis, left knee: Secondary | ICD-10-CM | POA: Diagnosis not present

## 2017-12-20 ENCOUNTER — Encounter: Payer: Self-pay | Admitting: Family Medicine

## 2017-12-20 ENCOUNTER — Ambulatory Visit (INDEPENDENT_AMBULATORY_CARE_PROVIDER_SITE_OTHER): Payer: PPO | Admitting: Family Medicine

## 2017-12-20 VITALS — BP 152/72 | HR 55 | Temp 98.2°F | Ht 67.0 in | Wt 187.2 lb

## 2017-12-20 DIAGNOSIS — I10 Essential (primary) hypertension: Secondary | ICD-10-CM | POA: Diagnosis not present

## 2017-12-20 NOTE — Progress Notes (Signed)
BP (!) 152/72 (BP Location: Left Arm, Cuff Size: Normal)   Pulse (!) 55   Temp 98.2 F (36.8 C) (Oral)   Ht 5\' 7"  (1.702 m)   Wt 187 lb 4 oz (84.9 kg)   SpO2 96%   BMI 29.33 kg/m    CC: BP check Subjective:    Patient ID: Michael Decker, male    DOB: 27-Jul-1933, 84 y.o.   MRN: [id number]  HPI: Michael Decker is a 84 y.o. male presenting on 12/20/2017 for Discuss BP (Pt feels BP keeps increasing. )   HTN - Compliant with current antihypertensive regimen of micardis 80mg  daily.  Does check blood pressures at home: and brings log - see below. One isolated episode of symptomatic low bp to 105/55. Denies HA, vision changes, CP/tightness, SOB, leg swelling.   BP log - over the past 5 months, BP creeping up. 130s/60s 06/2017 - 08/2017. 130-140s/60-70s 09/2017-11/2017. HR 50-60.   Aerobic exercise 3d/wk. Already avoids salt/sodium in diet. Good potassium rich diet. Good water intake. Lots of canned foods.  Relevant past medical, surgical, family and social history reviewed and updated as indicated. Interim medical history since our last visit reviewed. Allergies and medications reviewed and updated. Outpatient Medications Prior to Visit  Medication Sig Dispense Refill  . aspirin EC 81 MG tablet Take 1 tablet (81 mg total) by mouth every Monday, Wednesday, and Friday.    . cyanocobalamin (V-R VITAMIN B-12) 500 MCG tablet Take 1 tablet (500 mcg total) by mouth daily.    Marland Kitchen dutasteride (AVODART) 0.5 MG capsule Take 1 capsule (0.5 mg total) by mouth every other day. 90 capsule 3  . Krill Oil 500 MG CAPS Take 1 capsule by mouth.    . Magnesium 400 MG CAPS Take 1 tablet daily by mouth.    . Misc Natural Products (TART CHERRY ADVANCED PO) Take 1,000 mg at bedtime by mouth.    . Multiple Vitamins-Minerals (MULTIVITAMIN WITH MINERALS) tablet Take 1 tablet by mouth daily.    . NONFORMULARY OR COMPOUNDED ITEM     . NONFORMULARY OR COMPOUNDED ITEM     . omeprazole (PRILOSEC) 40 MG capsule Take 1  capsule (40 mg total) by mouth daily as needed. 30 capsule 6  . Potassium Gluconate 550 MG TABS Take 1 tablet at bedtime by mouth.    . telmisartan (MICARDIS) 80 MG tablet Take 80 mg daily by mouth.    . Turmeric 500 MG CAPS Take 1 capsule at bedtime by mouth.    . tamsulosin (FLOMAX) 0.4 MG CAPS capsule Take 1 capsule (0.4 mg total) by mouth daily. (Patient not taking: Reported on 12/20/2017) 30 capsule 11   No facility-administered medications prior to visit.      Per HPI unless specifically indicated in ROS section below Review of Systems     Objective:    BP (!) 152/72 (BP Location: Left Arm, Cuff Size: Normal)   Pulse (!) 55   Temp 98.2 F (36.8 C) (Oral)   Ht 5\' 7"  (1.702 m)   Wt 187 lb 4 oz (84.9 kg)   SpO2 96%   BMI 29.33 kg/m   Wt Readings from Last 3 Encounters:  12/20/17 187 lb 4 oz (84.9 kg)  10/04/17 186 lb 9.6 oz (84.6 kg)  06/29/17 184 lb 12.8 oz (83.8 kg)    Physical Exam  Constitutional: He appears well-developed and well-nourished. No distress.  HENT:  Mouth/Throat: Oropharynx is clear and moist. No oropharyngeal exudate.  Neck: No  thyromegaly present.  Cardiovascular: Normal rate, regular rhythm and normal heart sounds.  No murmur heard. Pulmonary/Chest: Effort normal and breath sounds normal. No respiratory distress. He has no wheezes. He has no rales.  Musculoskeletal: He exhibits no edema.  Psychiatric: He has a normal mood and affect.  Nursing note and vitals reviewed.     Assessment & Plan:   Problem List Items Addressed This Visit    HTN (hypertension) - Primary    Chronic. Slow increasing trend noted over last several months, however home readings and MAP remain adequate for age. No med changes for now, reviewed lifestyle and diet choices to maintain good blood pressure control. Continue to monitor, notify me if SBP remaining >140-150 to consider addition of second antihypertensive.           No orders of the defined types were placed in  this encounter.  No orders of the defined types were placed in this encounter.   Follow up plan: No follow-ups on file.  Ria Bush, MD

## 2017-12-20 NOTE — Patient Instructions (Addendum)
No med changes for now.  Watch canned foods (sodium). Continue monitoring blood pressures at home and let me know if averaging 140-150. We may then start second blood pressure medicine.

## 2017-12-20 NOTE — Assessment & Plan Note (Signed)
Chronic. Slow increasing trend noted over last several months, however home readings and MAP remain adequate for age. No med changes for now, reviewed lifestyle and diet choices to maintain good blood pressure control. Continue to monitor, notify me if SBP remaining >140-150 to consider addition of second antihypertensive.

## 2018-01-16 DIAGNOSIS — M25512 Pain in left shoulder: Secondary | ICD-10-CM | POA: Diagnosis not present

## 2018-01-16 DIAGNOSIS — M7582 Other shoulder lesions, left shoulder: Secondary | ICD-10-CM | POA: Diagnosis not present

## 2018-01-16 DIAGNOSIS — M7522 Bicipital tendinitis, left shoulder: Secondary | ICD-10-CM | POA: Diagnosis not present

## 2018-01-31 DIAGNOSIS — M1712 Unilateral primary osteoarthritis, left knee: Secondary | ICD-10-CM | POA: Diagnosis not present

## 2018-03-13 ENCOUNTER — Other Ambulatory Visit: Payer: Self-pay | Admitting: Student

## 2018-03-13 DIAGNOSIS — M778 Other enthesopathies, not elsewhere classified: Secondary | ICD-10-CM

## 2018-03-13 DIAGNOSIS — M7582 Other shoulder lesions, left shoulder: Principal | ICD-10-CM

## 2018-03-28 ENCOUNTER — Ambulatory Visit
Admission: RE | Admit: 2018-03-28 | Discharge: 2018-03-28 | Disposition: A | Discharge status: Home / Self Care | Payer: PPO | Source: Ambulatory Visit | Attending: Student | Admitting: Student

## 2018-03-28 DIAGNOSIS — M778 Other enthesopathies, not elsewhere classified: Secondary | ICD-10-CM

## 2018-03-28 DIAGNOSIS — M75102 Unspecified rotator cuff tear or rupture of left shoulder, not specified as traumatic: Secondary | ICD-10-CM | POA: Insufficient documentation

## 2018-03-28 DIAGNOSIS — M7552 Bursitis of left shoulder: Secondary | ICD-10-CM | POA: Diagnosis not present

## 2018-03-28 DIAGNOSIS — M7582 Other shoulder lesions, left shoulder: Secondary | ICD-10-CM | POA: Diagnosis not present

## 2018-03-28 DIAGNOSIS — M19012 Primary osteoarthritis, left shoulder: Secondary | ICD-10-CM | POA: Diagnosis not present

## 2018-03-28 DIAGNOSIS — M898X2 Other specified disorders of bone, upper arm: Secondary | ICD-10-CM | POA: Diagnosis not present

## 2018-04-03 DIAGNOSIS — M7522 Bicipital tendinitis, left shoulder: Secondary | ICD-10-CM | POA: Diagnosis not present

## 2018-04-03 DIAGNOSIS — M7582 Other shoulder lesions, left shoulder: Secondary | ICD-10-CM | POA: Diagnosis not present

## 2018-04-04 DIAGNOSIS — M7582 Other shoulder lesions, left shoulder: Secondary | ICD-10-CM | POA: Diagnosis not present

## 2018-04-04 DIAGNOSIS — M7522 Bicipital tendinitis, left shoulder: Secondary | ICD-10-CM | POA: Diagnosis not present

## 2018-04-18 DIAGNOSIS — M6281 Muscle weakness (generalized): Secondary | ICD-10-CM | POA: Diagnosis not present

## 2018-04-18 DIAGNOSIS — M7582 Other shoulder lesions, left shoulder: Secondary | ICD-10-CM | POA: Diagnosis not present

## 2018-04-18 DIAGNOSIS — M25512 Pain in left shoulder: Secondary | ICD-10-CM | POA: Diagnosis not present

## 2018-04-18 DIAGNOSIS — G8929 Other chronic pain: Secondary | ICD-10-CM | POA: Diagnosis not present

## 2018-04-23 ENCOUNTER — Ambulatory Visit (INDEPENDENT_AMBULATORY_CARE_PROVIDER_SITE_OTHER): Payer: PPO | Admitting: Family Medicine

## 2018-04-23 ENCOUNTER — Encounter: Payer: Self-pay | Admitting: Family Medicine

## 2018-04-23 VITALS — BP 136/62 | HR 57 | Temp 98.4°F | Ht 67.0 in | Wt 184.0 lb

## 2018-04-23 DIAGNOSIS — I10 Essential (primary) hypertension: Secondary | ICD-10-CM

## 2018-04-23 NOTE — Patient Instructions (Signed)
Blood pressures are adequate but could be better. Work on limiting sodium/salt in the diet.  Drain and rinse canned foods as feasible.  We will recheck this next visit.  Continue to watch numbers at home.

## 2018-04-23 NOTE — Progress Notes (Signed)
BP 136/62 (BP Location: Left Arm, Patient Position: Sitting, Cuff Size: Normal)   Pulse (!) 57   Temp 98.4 F (36.9 C) (Oral)   Ht 5\' 7"  (1.702 m)   Wt 184 lb (83.5 kg)   SpO2 96%   BMI 28.82 kg/m    CC: HTN Subjective:    Patient ID: Michael Decker, male    DOB: 11-04-1933, 84 y.o.   MRN: [id number]  HPI: Michael Decker is a 84 y.o. male presenting on 04/23/2018 for Hypertension (States he thinks BP is elevating to 606 systolic. Wants to discuss. )   HTN - Compliant with current antihypertensive regimen of micardis 80mg  daily. Does check blood pressures at home and brings log: more recently 120-150/50-70s, HR 40-50s. No low blood pressure readings or symptoms of dizziness/syncope. Denies HA, vision changes, CP/tightness, SOB, leg swelling.    Regular aerobic exercises. Tries to avoid salt/sodium in the diet, but he does eat lots of canned foods - no significant change in this "bout the same".   To get flu shot through [facility].   Relevant past medical, surgical, family and social history reviewed and updated as indicated. Interim medical history since our last visit reviewed. Allergies and medications reviewed and updated. Outpatient Medications Prior to Visit  Medication Sig Dispense Refill  . aspirin EC 81 MG tablet Take 1 tablet (81 mg total) by mouth every Monday, Wednesday, and Friday.    . cyanocobalamin (V-R VITAMIN B-12) 500 MCG tablet Take 1 tablet (500 mcg total) by mouth daily. (Patient taking differently: Take 500 mcg by mouth daily. As needed)    . dutasteride (AVODART) 0.5 MG capsule Take 1 capsule (0.5 mg total) by mouth every other day. 90 capsule 3  . Krill Oil 500 MG CAPS Take 1 capsule by mouth.    . Magnesium 400 MG CAPS Take 1 tablet daily by mouth.    . Multiple Vitamins-Minerals (MULTIVITAMIN WITH MINERALS) tablet Take 1 tablet by mouth daily.    . NONFORMULARY OR COMPOUNDED ITEM     . NONFORMULARY OR COMPOUNDED ITEM     . omeprazole (PRILOSEC) 40 MG capsule  Take 1 capsule (40 mg total) by mouth daily as needed. 30 capsule 6  . Potassium Gluconate 550 MG TABS Take 1 tablet at bedtime by mouth.    . telmisartan (MICARDIS) 80 MG tablet Take 80 mg daily by mouth.    . Turmeric 500 MG CAPS Take 1 capsule at bedtime by mouth.    . Misc Natural Products (TART CHERRY ADVANCED PO) Take 1,000 mg at bedtime by mouth.     No facility-administered medications prior to visit.      Per HPI unless specifically indicated in ROS section below Review of Systems     Objective:    BP 136/62 (BP Location: Left Arm, Patient Position: Sitting, Cuff Size: Normal)   Pulse (!) 57   Temp 98.4 F (36.9 C) (Oral)   Ht 5\' 7"  (1.702 m)   Wt 184 lb (83.5 kg)   SpO2 96%   BMI 28.82 kg/m   Wt Readings from Last 3 Encounters:  04/23/18 184 lb (83.5 kg)  12/20/17 187 lb 4 oz (84.9 kg)  10/04/17 186 lb 9.6 oz (84.6 kg)    Physical Exam  Constitutional: He appears well-developed and well-nourished. No distress.  HENT:  Mouth/Throat: Oropharynx is clear and moist. No oropharyngeal exudate.  Cardiovascular: Normal rate, regular rhythm and normal heart sounds.  No murmur heard. Pulmonary/Chest: Effort normal  and breath sounds normal. No respiratory distress. He has no wheezes. He has no rales.  Musculoskeletal: He exhibits no edema.  Psychiatric: He has a normal mood and affect.  Nursing note and vitals reviewed.   Lab Results  Component Value Date   CREATININE 1.15 06/29/2017   BUN 17 06/29/2017   NA 137 06/29/2017   K 4.1 06/29/2017   CL 102 06/29/2017   CO2 29 06/29/2017       Assessment & Plan:   Problem List Items Addressed This Visit    HTN (hypertension) - Primary    Chronic. BP readings adequate. Will continue to monitor at home. He did not work on diet changes - encouraged draining and rinsing canned foods as able. Will reassess at f/u visit 06/2018 - if consistently >140/90, would consider additional antihypertensive. Pt agrees with plan.             No orders of the defined types were placed in this encounter.  No orders of the defined types were placed in this encounter.   Follow up plan: Return if symptoms worsen or fail to improve.  Ria Bush, MD

## 2018-04-23 NOTE — Assessment & Plan Note (Signed)
Chronic. BP readings adequate. Will continue to monitor at home. He did not work on diet changes - encouraged draining and rinsing canned foods as able. Will reassess at f/u visit 06/2018 - if consistently >140/90, would consider additional antihypertensive. Pt agrees with plan.

## 2018-04-25 DIAGNOSIS — M7582 Other shoulder lesions, left shoulder: Secondary | ICD-10-CM | POA: Diagnosis not present

## 2018-05-15 DIAGNOSIS — M7582 Other shoulder lesions, left shoulder: Secondary | ICD-10-CM | POA: Diagnosis not present

## 2018-05-15 DIAGNOSIS — M1712 Unilateral primary osteoarthritis, left knee: Secondary | ICD-10-CM | POA: Diagnosis not present

## 2018-05-15 DIAGNOSIS — M7522 Bicipital tendinitis, left shoulder: Secondary | ICD-10-CM | POA: Diagnosis not present

## 2018-06-13 DIAGNOSIS — H47321 Drusen of optic disc, right eye: Secondary | ICD-10-CM | POA: Diagnosis not present

## 2018-06-22 ENCOUNTER — Other Ambulatory Visit: Payer: Self-pay | Admitting: Family Medicine

## 2018-06-27 DIAGNOSIS — M1712 Unilateral primary osteoarthritis, left knee: Secondary | ICD-10-CM | POA: Diagnosis not present

## 2018-06-29 ENCOUNTER — Other Ambulatory Visit: Payer: Self-pay | Admitting: Family Medicine

## 2018-07-02 ENCOUNTER — Other Ambulatory Visit (INDEPENDENT_AMBULATORY_CARE_PROVIDER_SITE_OTHER): Payer: PPO

## 2018-07-02 ENCOUNTER — Other Ambulatory Visit: Payer: Self-pay | Admitting: Family Medicine

## 2018-07-02 DIAGNOSIS — I10 Essential (primary) hypertension: Secondary | ICD-10-CM

## 2018-07-02 DIAGNOSIS — E785 Hyperlipidemia, unspecified: Secondary | ICD-10-CM | POA: Diagnosis not present

## 2018-07-02 DIAGNOSIS — N401 Enlarged prostate with lower urinary tract symptoms: Secondary | ICD-10-CM

## 2018-07-02 DIAGNOSIS — Z125 Encounter for screening for malignant neoplasm of prostate: Secondary | ICD-10-CM

## 2018-07-02 DIAGNOSIS — R351 Nocturia: Secondary | ICD-10-CM

## 2018-07-02 LAB — COMPREHENSIVE METABOLIC PANEL
ALBUMIN: 3.8 g/dL (ref 3.5–5.2)
ALK PHOS: 61 U/L (ref 39–117)
ALT: 15 U/L (ref 0–53)
AST: 22 U/L (ref 0–37)
BILIRUBIN TOTAL: 0.7 mg/dL (ref 0.2–1.2)
BUN: 23 mg/dL (ref 6–23)
CO2: 32 mEq/L (ref 19–32)
Calcium: 9.2 mg/dL (ref 8.4–10.5)
Chloride: 104 mEq/L (ref 96–112)
Creatinine, Ser: 1.11 mg/dL (ref 0.40–1.50)
GFR: 67.37 mL/min (ref >60.00)
GLUCOSE: 85 mg/dL (ref 70–99)
POTASSIUM: 4.8 meq/L (ref 3.5–5.1)
Sodium: 139 mEq/L (ref 135–145)
TOTAL PROTEIN: 6 g/dL (ref 6.0–8.3)

## 2018-07-02 LAB — IBC PANEL
IRON: 136 ug/dL (ref 42–165)
Saturation Ratios: 41.3 % (ref 20.0–50.0)
Transferrin: 235 mg/dL (ref 212.0–360.0)

## 2018-07-02 LAB — FERRITIN: FERRITIN: 128.5 ng/mL (ref 22.0–322.0)

## 2018-07-02 LAB — PSA: PSA: 0.5 ng/mL (ref 0.10–4.00)

## 2018-07-02 LAB — LIPID PANEL
Cholesterol: 175 mg/dL (ref 0–200)
HDL: 63.1 mg/dL (ref >39.00)
LDL Cholesterol: 91 mg/dL (ref 0–99)
NonHDL: 111.45
Total CHOL/HDL Ratio: 3
Triglycerides: 101 mg/dL (ref 0.0–149.0)
VLDL: 20.2 mg/dL (ref 0.0–40.0)

## 2018-07-02 NOTE — Telephone Encounter (Signed)
[facility] request refill micardis 80 mg. Pt last seen 04/23/18 for HTN and has CPX scheduled for 07/10/18. Refilled # 90 to [facility] until seen 07/10/18.

## 2018-07-04 DIAGNOSIS — M1712 Unilateral primary osteoarthritis, left knee: Secondary | ICD-10-CM | POA: Diagnosis not present

## 2018-07-09 ENCOUNTER — Encounter: Payer: PPO | Admitting: Family Medicine

## 2018-07-10 ENCOUNTER — Encounter: Payer: Self-pay | Admitting: Family Medicine

## 2018-07-10 ENCOUNTER — Ambulatory Visit (INDEPENDENT_AMBULATORY_CARE_PROVIDER_SITE_OTHER): Payer: PPO | Admitting: Family Medicine

## 2018-07-10 VITALS — BP 124/60 | HR 58 | Temp 97.8°F | Ht 70.0 in | Wt 180.5 lb

## 2018-07-10 DIAGNOSIS — R351 Nocturia: Secondary | ICD-10-CM

## 2018-07-10 DIAGNOSIS — N401 Enlarged prostate with lower urinary tract symptoms: Secondary | ICD-10-CM

## 2018-07-10 DIAGNOSIS — E785 Hyperlipidemia, unspecified: Secondary | ICD-10-CM

## 2018-07-10 DIAGNOSIS — I10 Essential (primary) hypertension: Secondary | ICD-10-CM

## 2018-07-10 DIAGNOSIS — Z7189 Other specified counseling: Secondary | ICD-10-CM

## 2018-07-10 DIAGNOSIS — Z0000 Encounter for general adult medical examination without abnormal findings: Secondary | ICD-10-CM

## 2018-07-10 MED ORDER — MAGNESIUM 400 MG PO CAPS: 1.0000 | ORAL_CAPSULE | ORAL | Status: DC

## 2018-07-10 NOTE — Assessment & Plan Note (Signed)
Preventative protocols reviewed and updated unless pt declined. Discussed healthy diet and lifestyle.  

## 2018-07-10 NOTE — Progress Notes (Signed)
BP 124/60 (BP Location: Left Arm, Patient Position: Sitting, Cuff Size: Normal)   Pulse (!) 58   Temp 97.8 F (36.6 C) (Oral)   Ht 5\' 10"  (1.778 m)   Wt 180 lb 8 oz (81.9 kg)   SpO2 95%   BMI 25.90 kg/m    CC: AMW/CPE Subjective:    Patient ID: Michael Decker, male    DOB: [birth year], 84 y.o.   MRN: [id number]  HPI: Michael Decker is a 84 y.o. male presenting on 07/10/2018 for Medicare Wellness   Did not see Katha Cabal this year.  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 Wears hearing aid in left ear about 5 yrs. Vision Screening Comments: Last eye exam, 06/2017  Depression screen passed Fall risk screen passed  BP well controlled at home 130/60s.  Thinks he may cut down on osteo biflex. Known L knee OA s/p steroid injections and L RTC tears - manageable at this time. Considering decreasing magnesium to QOD.   Preventative: COLONOSCOPY 02/2017 TA, diverticulosis, int hem no f/u needed Henrene Pastor) Prostate - previously seen by Dr Alinda Money - h/o BPH. On avodart.Would like PSA screen. Nocturia 2-3, weak stream.  Flushot yearly Pneumovax 2002and 2017, prevnar 2015 Tdap 2010 zostavax 2013 shingrix - discussed Advanced planning - HCPOA and advanced directive scanned in chart 06/2015. Wife Michael Decker then Michael Decker then Michael Decker are [facility]. Seat belt use discussed Sunscreen use discussed. No changing moles on skin.  Ex smoker (cigars).  Alcohol - a few drinks a week Dentist yearly Eye exam yearly  Lives with wife Occupation: prior worked with Clinical cytogeneticist as Recruitment consultant; volunteers at [employer] for [employer] in [place],  [place]: BS Activity: works out at [employer] 3x/wk on recumbent bike Diet: some water, fruits/vegetables daily  Relevant past medical, surgical, family and social history reviewed and updated as indicated. Interim medical history since our last visit reviewed. Allergies and medications reviewed  and updated. Outpatient Medications Prior to Visit  Medication Sig Dispense Refill  . aspirin EC 81 MG tablet Take 1 tablet (81 mg total) by mouth every Monday, Wednesday, and Friday.    . cyanocobalamin (V-R VITAMIN B-12) 500 MCG tablet Take 1 tablet (500 mcg total) by mouth daily. (Patient taking differently: Take 500 mcg by mouth daily. As needed)    . dutasteride (AVODART) 0.5 MG capsule TAKE ONE CAPSULE BY MOUTH EVERY OTHER DAY 45 capsule 0  . Krill Oil 500 MG CAPS Take 1 capsule by mouth.    . Multiple Vitamins-Minerals (MULTIVITAMIN WITH MINERALS) tablet Take 1 tablet by mouth daily.    . NONFORMULARY OR COMPOUNDED ITEM     . NONFORMULARY OR COMPOUNDED ITEM     . omeprazole (PRILOSEC) 40 MG capsule Take 1 capsule (40 mg total) by mouth daily as needed. 30 capsule 6  . Potassium Gluconate 550 MG TABS Take 1 tablet at bedtime by mouth.    . telmisartan (MICARDIS) 80 MG tablet TAKE 1 TABLET BY MOUTH DAILY 90 tablet 0  . Turmeric 500 MG CAPS Take 1 capsule at bedtime by mouth.    . Magnesium 400 MG CAPS Take 1 tablet daily by mouth.     No facility-administered medications prior to visit.      Per HPI unless specifically indicated in ROS section below Review of Systems  Constitutional: Negative for activity change, appetite change, chills, fatigue, fever and unexpected weight change.  HENT:  Negative for hearing loss.   Eyes: Negative for visual disturbance.  Respiratory: Negative for cough, chest tightness, shortness of breath and wheezing.   Cardiovascular: Negative for chest pain, palpitations and leg swelling.  Gastrointestinal: Negative for abdominal distention, abdominal pain, blood in stool, constipation, diarrhea, nausea and vomiting.  Genitourinary: Negative for difficulty urinating and hematuria.  Musculoskeletal: Negative for arthralgias, myalgias and neck pain.  Skin: Negative for rash.  Neurological: Negative for dizziness, seizures, syncope and headaches.    Hematological: Negative for adenopathy. Does not bruise/bleed easily.  Psychiatric/Behavioral: Negative for dysphoric mood. The patient is not nervous/anxious.        Objective:    BP 124/60 (BP Location: Left Arm, Patient Position: Sitting, Cuff Size: Normal)   Pulse (!) 58   Temp 97.8 F (36.6 C) (Oral)   Ht 5\' 10"  (1.778 m)   Wt 180 lb 8 oz (81.9 kg)   SpO2 95%   BMI 25.90 kg/m   Wt Readings from Last 3 Encounters:  07/10/18 180 lb 8 oz (81.9 kg)  04/23/18 184 lb (83.5 kg)  12/20/17 187 lb 4 oz (84.9 kg)    Physical Exam Vitals signs and nursing note reviewed.  Constitutional:      General: He is not in acute distress.    Appearance: He is well-developed.  HENT:     Head: Normocephalic and atraumatic.     Right Ear: Hearing, tympanic membrane, ear canal and external ear normal.     Left Ear: Hearing, tympanic membrane, ear canal and external ear normal.     Nose: Nose normal.     Mouth/Throat:     Pharynx: Uvula midline. No oropharyngeal exudate or posterior oropharyngeal erythema.  Eyes:     General: No scleral icterus.    Conjunctiva/sclera: Conjunctivae normal.     Pupils: Pupils are equal, round, and reactive to light.  Neck:     Musculoskeletal: Normal range of motion and neck supple.     Vascular: No carotid bruit.  Cardiovascular:     Rate and Rhythm: Normal rate and regular rhythm.     Pulses:          Radial pulses are 2+ on the right side and 2+ on the left side.     Heart sounds: Normal heart sounds. No murmur.  Pulmonary:     Effort: Pulmonary effort is normal. No respiratory distress.     Breath sounds: Normal breath sounds. No wheezing or rales.  Abdominal:     General: Bowel sounds are normal. There is no distension.     Palpations: Abdomen is soft. There is no mass.     Tenderness: There is no abdominal tenderness. There is no guarding or rebound.  Musculoskeletal: Normal range of motion.  Lymphadenopathy:     Cervical: No cervical  adenopathy.  Skin:    General: Skin is warm and dry.     Findings: No rash.  Neurological:     Mental Status: He is alert and oriented to person, place, and time.     Comments: CN grossly intact, station and gait intact Recall 3/3 Calculation 3/5 serial 7s  Psychiatric:        Behavior: Behavior normal.        Thought Content: Thought content normal.        Judgment: Judgment normal.    Results for orders placed or performed in visit on 07/02/18  PSA  Result Value Ref Range   PSA 0.50 0.10 - 4.00 ng/mL  Assessment & Plan:   Problem List Items Addressed This Visit    Medicare annual wellness visit, subsequent - Primary    I have personally reviewed the Medicare Annual Wellness questionnaire and have noted 1. The patient's medical and social history 2. Their use of alcohol, tobacco or illicit drugs 3. Their current medications and supplements 4. The patient's functional ability including ADL's, fall risks, home safety risks and hearing or visual impairment. Cognitive function has been assessed and addressed as indicated.  5. Diet and physical activity 6. Evidence for depression or mood disorders The patients weight, height, BMI have been recorded in the chart. I have made referrals, counseling and provided education to the patient based on review of the above and I have provided the pt with a written personalized care plan for preventive services. Provider list updated.. See scanned questionairre as needed for further documentation. Reviewed preventative protocols and updated unless pt declined.       Iron excess    Stable.       HTN (hypertension)    Chronic, stable. Continue telmisartan. Doing better with lower salt intake.       HLD (hyperlipidemia)    Chronic, stable on krill oil. The ASCVD Risk score [name] DC Jr., et al., 2013) failed to calculate for the following reasons:   The 2013 ASCVD risk score is only valid for ages 43 to 64       Health maintenance  examination    Preventative protocols reviewed and updated unless pt declined. Discussed healthy diet and lifestyle.       Benign prostatic hyperplasia    Chronic, stable. Continue avodart.       Advanced care planning/counseling discussion       Meds ordered this encounter  Medications  . Magnesium 400 MG CAPS    Sig: Take 1 tablet by mouth every other day.   No orders of the defined types were placed in this encounter.   Follow up plan: Return in about 1 year (around 07/11/2019) for medicare wellness visit, annual exam, prior fasting for blood work.  Ria Bush, MD

## 2018-07-10 NOTE — Assessment & Plan Note (Signed)
Chronic, stable on krill oil. The ASCVD Risk score Michael Decker DC Jr., et al., 2013) failed to calculate for the following reasons:   The 2013 ASCVD risk score is only valid for ages 24 to 62

## 2018-07-10 NOTE — Assessment & Plan Note (Signed)
Stable

## 2018-07-10 NOTE — Assessment & Plan Note (Signed)
Chronic, stable. Continue avodart.

## 2018-07-10 NOTE — Assessment & Plan Note (Signed)
Chronic, stable. Continue telmisartan. Doing better with lower salt intake.

## 2018-07-10 NOTE — Patient Instructions (Addendum)
If interested, check with pharmacy about new 2 shot shingles series (shingrix).  You are doing well today. Labs were good. Blood pressures was better! Return as needed or in 1 year for wellness visit/physical, sooner if needed.  Health Maintenance After Age 82 After age 66, you are at a higher risk for certain long-term diseases and infections as well as injuries from falls. Falls are a major cause of broken bones and head injuries in people who are older than age 57. Getting regular preventive care can help to keep you healthy and well. Preventive care includes getting regular testing and making lifestyle changes as recommended by your health care provider. Talk with your health care provider about:  Which screenings and tests you should have. A screening is a test that checks for a disease when you have no symptoms.  A diet and exercise plan that is right for you. What should I know about screenings and tests to prevent falls? Screening and testing are the best ways to find a health problem early. Early diagnosis and treatment give you the best chance of managing medical conditions that are common after age 23. Certain conditions and lifestyle choices may make you more likely to have a fall. Your health care provider may recommend:  Regular vision checks. Poor vision and conditions such as cataracts can make you more likely to have a fall. If you wear glasses, make sure to get your prescription updated if your vision changes.  Medicine review. Work with your health care provider to regularly review all of the medicines you are taking, including over-the-counter medicines. Ask your health care provider about any side effects that may make you more likely to have a fall. Tell your health care provider if any medicines that you take make you feel dizzy or sleepy.  Osteoporosis screening. Osteoporosis is a condition that causes the bones to get weaker. This can make the bones weak and cause them to  break more easily.  Blood pressure screening. Blood pressure changes and medicines to control blood pressure can make you feel dizzy.  Strength and balance checks. Your health care provider may recommend certain tests to check your strength and balance while standing, walking, or changing positions.  Foot health exam. Foot pain and numbness, as well as not wearing proper footwear, can make you more likely to have a fall.  Depression screening. You may be more likely to have a fall if you have a fear of falling, feel emotionally low, or feel unable to do activities that you used to do.  Alcohol use screening. Using too much alcohol can affect your balance and may make you more likely to have a fall. What actions can I take to lower my risk of falls? General instructions  Talk with your health care provider about your risks for falling. Tell your health care provider if: ? You fall. Be sure to tell your health care provider about all falls, even ones that seem minor. ? You feel dizzy, sleepy, or off-balance.  Take over-the-counter and prescription medicines only as told by your health care provider. These include any supplements.  Eat a healthy diet and maintain a healthy weight. A healthy diet includes low-fat dairy products, low-fat (lean) meats, and fiber from whole grains, beans, and lots of fruits and vegetables. Home safety  Remove any tripping hazards, such as rugs, cords, and clutter.  Install safety equipment such as grab bars in bathrooms and safety rails on stairs.  Keep rooms and walkways  well-lit. Activity   Follow a regular exercise program to stay fit. This will help you maintain your balance. Ask your health care provider what types of exercise are appropriate for you.  If you need a cane or walker, use it as recommended by your health care provider.  Wear supportive shoes that have nonskid soles. Lifestyle  Do not drink alcohol if your health care provider tells  you not to drink.  If you drink alcohol, limit how much you have: ? 0-1 drink a day for women. ? 0-2 drinks a day for men.  Be aware of how much alcohol is in your drink. In the [place] one drink equals one typical bottle of beer (12 oz), one-half glass of wine (5 oz), or one shot of hard liquor (1 oz).  Do not use any products that contain nicotine or tobacco, such as cigarettes and e-cigarettes. If you need help quitting, ask your health care provider. Summary  Having a healthy lifestyle and getting preventive care can help to protect your health and wellness after age 21.  Screening and testing are the best way to find a health problem early and help you avoid having a fall. Early diagnosis and treatment give you the best chance for managing medical conditions that are more common for people who are older than age 27.  Falls are a major cause of broken bones and head injuries in people who are older than age 63. Take precautions to prevent a fall at home.  Work with your health care provider to learn what changes you can make to improve your health and wellness and to prevent falls. This information is not intended to replace advice given to you by your health care provider. Make sure you discuss any questions you have with your health care provider. Document Released: 05/23/2017 Document Revised: 05/23/2017 Document Reviewed: 05/23/2017 Elsevier Interactive Patient Education  2019 [employer].

## 2018-07-10 NOTE — Assessment & Plan Note (Signed)

## 2018-07-11 DIAGNOSIS — M1712 Unilateral primary osteoarthritis, left knee: Secondary | ICD-10-CM | POA: Diagnosis not present

## 2018-08-07 ENCOUNTER — Other Ambulatory Visit: Payer: Self-pay

## 2018-08-07 ENCOUNTER — Encounter: Payer: Self-pay | Admitting: *Deleted

## 2018-08-07 DIAGNOSIS — H2511 Age-related nuclear cataract, right eye: Secondary | ICD-10-CM | POA: Diagnosis not present

## 2018-08-07 NOTE — Anesthesia Preprocedure Evaluation (Addendum)
Anesthesia Evaluation  Patient identified by MRN, date of birth, ID band Patient awake    Reviewed: Allergy & Precautions, NPO status , Patient's Chart, lab work & pertinent test results  History of Anesthesia Complications Negative for: history of anesthetic complications  Airway Mallampati: I   Neck ROM: Full    Dental  (+)    Pulmonary former smoker (remote),    Pulmonary exam normal        Cardiovascular Exercise Tolerance: Good hypertension, Normal cardiovascular exam Rhythm:Regular Rate:Normal     Neuro/Psych HOH    GI/Hepatic GERD  ,  Endo/Other  negative endocrine ROS  Renal/GU negative Renal ROS     Musculoskeletal  (+) Arthritis ,   Abdominal   Peds  Hematology negative hematology ROS (+)   Anesthesia Other Findings BPH  Reproductive/Obstetrics                            Anesthesia Physical Anesthesia Plan  ASA: II  Anesthesia Plan: MAC   Post-op Pain Management:    Induction: Intravenous  PONV Risk Score and Plan: 1 and TIVA and Midazolam  Airway Management Planned: Natural Airway  Additional Equipment:   Intra-op Plan:   Post-operative Plan:   Informed Consent: I have reviewed the patients History and Physical, chart, labs and discussed the procedure including the risks, benefits and alternatives for the proposed anesthesia with the patient or authorized representative who has indicated his/her understanding and acceptance.       Plan Discussed with: CRNA  Anesthesia Plan Comments:        Anesthesia Quick Evaluation

## 2018-08-08 NOTE — Discharge Instructions (Signed)
General Anesthesia, Adult, Care After  This sheet gives you information about how to care for yourself after your procedure. Your health care provider may also give you more specific instructions. If you have problems or questions, contact your health care provider.  What can I expect after the procedure?  After the procedure, the following side effects are common:  Pain or discomfort at the IV site.  Nausea.  Vomiting.  Sore throat.  Trouble concentrating.  Feeling cold or chills.  Weak or tired.  Sleepiness and fatigue.  Soreness and body aches. These side effects can affect parts of the body that were not involved in surgery.  Follow these instructions at home:    For at least 24 hours after the procedure:  Have a responsible adult stay with you. It is important to have someone help care for you until you are awake and alert.  Rest as needed.  Do not:  Participate in activities in which you could fall or become injured.  Drive.  Use heavy machinery.  Drink alcohol.  Take sleeping pills or medicines that cause drowsiness.  Make important decisions or sign legal documents.  Take care of children on your own.  Eating and drinking  Follow any instructions from your health care provider about eating or drinking restrictions.  When you feel hungry, start by eating small amounts of foods that are soft and easy to digest (bland), such as toast. Gradually return to your regular diet.  Drink enough fluid to keep your urine pale yellow.  If you vomit, rehydrate by drinking water, juice, or clear broth.  General instructions  If you have sleep apnea, surgery and certain medicines can increase your risk for breathing problems. Follow instructions from your health care provider about wearing your sleep device:  Anytime you are sleeping, including during daytime naps.  While taking prescription pain medicines, sleeping medicines, or medicines that make you drowsy.  Return to your normal activities as told by your health care  provider. Ask your health care provider what activities are safe for you.  Take over-the-counter and prescription medicines only as told by your health care provider.  If you smoke, do not smoke without supervision.  Keep all follow-up visits as told by your health care provider. This is important.  Contact a health care provider if:  You have nausea or vomiting that does not get better with medicine.  You cannot eat or drink without vomiting.  You have pain that does not get better with medicine.  You are unable to pass urine.  You develop a skin rash.  You have a fever.  You have redness around your IV site that gets worse.  Get help right away if:  You have difficulty breathing.  You have chest pain.  You have blood in your urine or stool, or you vomit blood.  Summary  After the procedure, it is common to have a sore throat or nausea. It is also common to feel tired.  Have a responsible adult stay with you for the first 24 hours after general anesthesia. It is important to have someone help care for you until you are awake and alert.  When you feel hungry, start by eating small amounts of foods that are soft and easy to digest (bland), such as toast. Gradually return to your regular diet.  Drink enough fluid to keep your urine pale yellow.  Return to your normal activities as told by your health care provider. Ask your health care   provider what activities are safe for you.  This information is not intended to replace advice given to you by your health care provider. Make sure you discuss any questions you have with your health care provider.  Document Released: 10/16/2000 Document Revised: 02/23/2017 Document Reviewed: 02/23/2017  Elsevier Interactive Patient Education  2019 [employer]

## 2018-08-14 ENCOUNTER — Ambulatory Visit
Admission: RE | Admit: 2018-08-14 | Discharge: 2018-08-14 | Disposition: A | Discharge status: Home / Self Care | Payer: PPO | Attending: Ophthalmology | Admitting: Ophthalmology

## 2018-08-14 ENCOUNTER — Ambulatory Visit: Payer: PPO | Admitting: Anesthesiology

## 2018-08-14 ENCOUNTER — Encounter
Admission: RE | Disposition: A | Discharge status: Home / Self Care | Payer: Self-pay | Source: Home / Self Care | Attending: Ophthalmology

## 2018-08-14 DIAGNOSIS — M1990 Unspecified osteoarthritis, unspecified site: Secondary | ICD-10-CM | POA: Insufficient documentation

## 2018-08-14 DIAGNOSIS — Z87891 Personal history of nicotine dependence: Secondary | ICD-10-CM | POA: Insufficient documentation

## 2018-08-14 DIAGNOSIS — K219 Gastro-esophageal reflux disease without esophagitis: Secondary | ICD-10-CM | POA: Insufficient documentation

## 2018-08-14 DIAGNOSIS — Z79899 Other long term (current) drug therapy: Secondary | ICD-10-CM | POA: Diagnosis not present

## 2018-08-14 DIAGNOSIS — N400 Enlarged prostate without lower urinary tract symptoms: Secondary | ICD-10-CM | POA: Insufficient documentation

## 2018-08-14 DIAGNOSIS — H2511 Age-related nuclear cataract, right eye: Secondary | ICD-10-CM | POA: Insufficient documentation

## 2018-08-14 DIAGNOSIS — H9190 Unspecified hearing loss, unspecified ear: Secondary | ICD-10-CM | POA: Insufficient documentation

## 2018-08-14 DIAGNOSIS — I10 Essential (primary) hypertension: Secondary | ICD-10-CM | POA: Insufficient documentation

## 2018-08-14 DIAGNOSIS — Z7982 Long term (current) use of aspirin: Secondary | ICD-10-CM | POA: Diagnosis not present

## 2018-08-14 DIAGNOSIS — H25811 Combined forms of age-related cataract, right eye: Secondary | ICD-10-CM | POA: Diagnosis not present

## 2018-08-14 HISTORY — PX: CATARACT EXTRACTION W/PHACO: SHX586

## 2018-08-14 HISTORY — DX: Presence of external hearing-aid: Z97.4

## 2018-08-14 HISTORY — DX: Unspecified osteoarthritis, unspecified site: M19.90

## 2018-08-14 SURGERY — PHACOEMULSIFICATION, CATARACT, WITH IOL INSERTION
Anesthesia: Monitor Anesthesia Care | Laterality: Right

## 2018-08-14 MED ORDER — ARMC OPHTHALMIC DILATING DROPS
1.0000 "application " | OPHTHALMIC | Status: DC | PRN
  Administered 2018-08-14 (×3): 1 via OPHTHALMIC

## 2018-08-14 MED ORDER — ACETAMINOPHEN 325 MG PO TABS: 650.0000 mg | ORAL_TABLET | Freq: Once | ORAL | Status: DC | PRN

## 2018-08-14 MED ORDER — EPINEPHRINE PF 1 MG/ML IJ SOLN
INTRAOCULAR | Status: DC | PRN
  Administered 2018-08-14: 82 mL via OPHTHALMIC

## 2018-08-14 MED ORDER — ONDANSETRON HCL 4 MG/2ML IJ SOLN: 4.0000 mg | Freq: Once | INTRAMUSCULAR | Status: DC | PRN

## 2018-08-14 MED ORDER — LACTATED RINGERS IV SOLN: INTRAVENOUS | Status: DC

## 2018-08-14 MED ORDER — MIDAZOLAM HCL 2 MG/2ML IJ SOLN
INTRAMUSCULAR | Status: DC | PRN
  Administered 2018-08-14 (×2): 1 mg via INTRAVENOUS

## 2018-08-14 MED ORDER — TETRACAINE HCL 0.5 % OP SOLN
1.0000 [drp] | OPHTHALMIC | Status: DC | PRN
  Administered 2018-08-14 (×2): 1 [drp] via OPHTHALMIC

## 2018-08-14 MED ORDER — CEFUROXIME OPHTHALMIC INJECTION 1 MG/0.1 ML
INJECTION | OPHTHALMIC | Status: DC | PRN
  Administered 2018-08-14: .2 mL via INTRACAMERAL

## 2018-08-14 MED ORDER — FENTANYL CITRATE (PF) 100 MCG/2ML IJ SOLN
INTRAMUSCULAR | Status: DC | PRN
  Administered 2018-08-14 (×2): 50 ug via INTRAVENOUS

## 2018-08-14 MED ORDER — ACETAMINOPHEN 160 MG/5ML PO SOLN: 325.0000 mg | ORAL | Status: DC | PRN

## 2018-08-14 MED ORDER — BRIMONIDINE TARTRATE-TIMOLOL 0.2-0.5 % OP SOLN
OPHTHALMIC | Status: DC | PRN
  Administered 2018-08-14: 1 [drp] via OPHTHALMIC

## 2018-08-14 MED ORDER — NA HYALUR & NA CHOND-NA HYALUR 0.4-0.35 ML IO KIT
PACK | INTRAOCULAR | Status: DC | PRN
  Administered 2018-08-14: 1 mL via INTRAOCULAR

## 2018-08-14 MED ORDER — MOXIFLOXACIN HCL 0.5 % OP SOLN
1.0000 [drp] | OPHTHALMIC | Status: DC | PRN
  Administered 2018-08-14 (×3): 1 [drp] via OPHTHALMIC

## 2018-08-14 MED ORDER — LIDOCAINE HCL (PF) 2 % IJ SOLN
INTRAOCULAR | Status: DC | PRN
  Administered 2018-08-14: 1 mL

## 2018-08-14 SURGICAL SUPPLY — 26 items
CANNULA ANT/CHMB 27G (MISCELLANEOUS) ×1 IMPLANT
CANNULA ANT/CHMB 27GA (MISCELLANEOUS) ×2 IMPLANT
GLOVE SURG LX 7.5 STRW (GLOVE) ×1
GLOVE SURG LX STRL 7.5 STRW (GLOVE) ×1 IMPLANT
GLOVE SURG TRIUMPH 8.0 PF LTX (GLOVE) ×2 IMPLANT
GOWN STRL REUS W/ TWL LRG LVL3 (GOWN DISPOSABLE) ×2 IMPLANT
GOWN STRL REUS W/TWL LRG LVL3 (GOWN DISPOSABLE) ×4
LENS IOL TECNIS ITEC 18.5 (Intraocular Lens) ×1 IMPLANT
MARKER SKIN DUAL TIP RULER LAB (MISCELLANEOUS) ×2 IMPLANT
NDL FILTER BLUNT 18X1 1/2 (NEEDLE) ×1 IMPLANT
NDL RETROBULBAR .5 NSTRL (NEEDLE) IMPLANT
NEEDLE FILTER BLUNT 18X 1/2SAF (NEEDLE) ×1
NEEDLE FILTER BLUNT 18X1 1/2 (NEEDLE) ×1 IMPLANT
PACK CATARACT BRASINGTON (MISCELLANEOUS) ×2 IMPLANT
PACK EYE AFTER SURG (MISCELLANEOUS) ×2 IMPLANT
PACK OPTHALMIC (MISCELLANEOUS) ×2 IMPLANT
RING MALYGIN 7.0 (MISCELLANEOUS) IMPLANT
SUT ETHILON 10-0 CS-B-6CS-B-6 (SUTURE)
SUT VICRYL  9 0 (SUTURE)
SUT VICRYL 9 0 (SUTURE) IMPLANT
SUTURE EHLN 10-0 CS-B-6CS-B-6 (SUTURE) IMPLANT
SYR 3ML LL SCALE MARK (SYRINGE) ×2 IMPLANT
SYR 5ML LL (SYRINGE) ×2 IMPLANT
SYR TB 1ML LUER SLIP (SYRINGE) ×2 IMPLANT
WATER STERILE IRR 500ML POUR (IV SOLUTION) ×2 IMPLANT
WIPE NON LINTING 3.25X3.25 (MISCELLANEOUS) ×2 IMPLANT

## 2018-08-14 NOTE — Anesthesia Procedure Notes (Signed)
Procedure Name: MAC Performed by: Jarone Ostergaard M, CRNA Pre-anesthesia Checklist: Timeout performed, Patient being monitored, Suction available, Emergency Drugs available and Patient identified Patient Re-evaluated:Patient Re-evaluated prior to induction Oxygen Delivery Method: Nasal cannula       

## 2018-08-14 NOTE — Transfer of Care (Signed)
Immediate Anesthesia Transfer of Care Note  Patient: Michael Decker  Procedure(s) Performed: CATARACT EXTRACTION PHACO AND INTRAOCULAR LENS PLACEMENT (IOC)  RIGHT (Right )  Patient Location: PACU  Anesthesia Type: MAC  Level of Consciousness: awake, alert  and patient cooperative  Airway and Oxygen Therapy: Patient Spontanous Breathing and Patient connected to supplemental oxygen  Post-op Assessment: Post-op Vital signs reviewed, Patient's Cardiovascular Status Stable, Respiratory Function Stable, Patent Airway and No signs of Nausea or vomiting  Post-op Vital Signs: Reviewed and stable  Complications: No apparent anesthesia complications

## 2018-08-14 NOTE — Anesthesia Postprocedure Evaluation (Signed)
Anesthesia Post Note  Patient: Michael Decker  Procedure(s) Performed: CATARACT EXTRACTION PHACO AND INTRAOCULAR LENS PLACEMENT (IOC)  RIGHT (Right )  Patient location during evaluation: PACU Anesthesia Type: MAC Level of consciousness: awake and alert, oriented and patient cooperative Pain management: pain level controlled Vital Signs Assessment: post-procedure vital signs reviewed and stable Respiratory status: spontaneous breathing, nonlabored ventilation and respiratory function stable Cardiovascular status: blood pressure returned to baseline and stable Postop Assessment: adequate PO intake Anesthetic complications: no    Darrin Nipper

## 2018-08-14 NOTE — Op Note (Signed)
LOCATION:  [place]   PREOPERATIVE DIAGNOSIS:    Nuclear sclerotic cataract right eye. H25.11   POSTOPERATIVE DIAGNOSIS:  Nuclear sclerotic cataract right eye.     PROCEDURE:  Phacoemusification with posterior chamber intraocular lens placement of the right eye   LENS:   Implant Name Type Inv. Item Serial No. Manufacturer Lot No. LRB No. Used  LENS IOL DIOP 18.5 - [id number] Intraocular Lens LENS IOL DIOP 18.5 [id number]  Right 1        ULTRASOUND TIME: 22 % of 2 minutes, 8 seconds.  CDE 27.8   SURGEON:  Wyonia Hough, MD   ANESTHESIA:  Topical with tetracaine drops and 2% Xylocaine jelly, augmented with 1% preservative-free intracameral lidocaine.    COMPLICATIONS:  None.   DESCRIPTION OF PROCEDURE:  The patient was identified in the holding room and transported to the operating room and placed in the supine position under the operating microscope.  The right eye was identified as the operative eye and it was prepped and draped in the usual sterile ophthalmic fashion.   A 1 millimeter clear-corneal paracentesis was made at the 12:00 position.  0.5 ml of preservative-free 1% lidocaine was injected into the anterior chamber. The anterior chamber was filled with Viscoat viscoelastic.  A 2.4 millimeter keratome was used to make a near-clear corneal incision at the 9:00 position.  A curvilinear capsulorrhexis was made with a cystotome and capsulorrhexis forceps.  Balanced salt solution was used to hydrodissect and hydrodelineate the nucleus.   Phacoemulsification was then used in stop and chop fashion to remove the lens nucleus and epinucleus.  The remaining cortex was then removed using the irrigation and aspiration handpiece. Provisc was then placed into the capsular bag to distend it for lens placement.  A lens was then injected into the capsular bag.  The remaining viscoelastic was aspirated.   Wounds were hydrated with balanced salt solution.  The anterior  chamber was inflated to a physiologic pressure with balanced salt solution.  No wound leaks were noted. Cefuroxime 0.1 ml of a 10mg /ml solution was injected into the anterior chamber for a dose of 1 mg of intracameral antibiotic at the completion of the case.   Timolol and Brimonidine drops were applied to the eye.  The patient was taken to the recovery room in stable condition without complications of anesthesia or surgery.   Michael Decker 08/14/2018, 11:32 AM

## 2018-08-14 NOTE — H&P (Signed)

## 2018-08-15 ENCOUNTER — Encounter: Payer: Self-pay | Admitting: Ophthalmology

## 2018-08-22 DIAGNOSIS — H2512 Age-related nuclear cataract, left eye: Secondary | ICD-10-CM | POA: Diagnosis not present

## 2018-09-05 NOTE — Discharge Instructions (Signed)

## 2018-09-08 IMAGING — DX DG CHEST 2V
2 series · 2 of 2 positions shown · non-contrast
Comparison: None.

CLINICAL DATA: Coarse lung sounds on physical examination.

EXAM:
CHEST  2 VIEW

[chest pa]
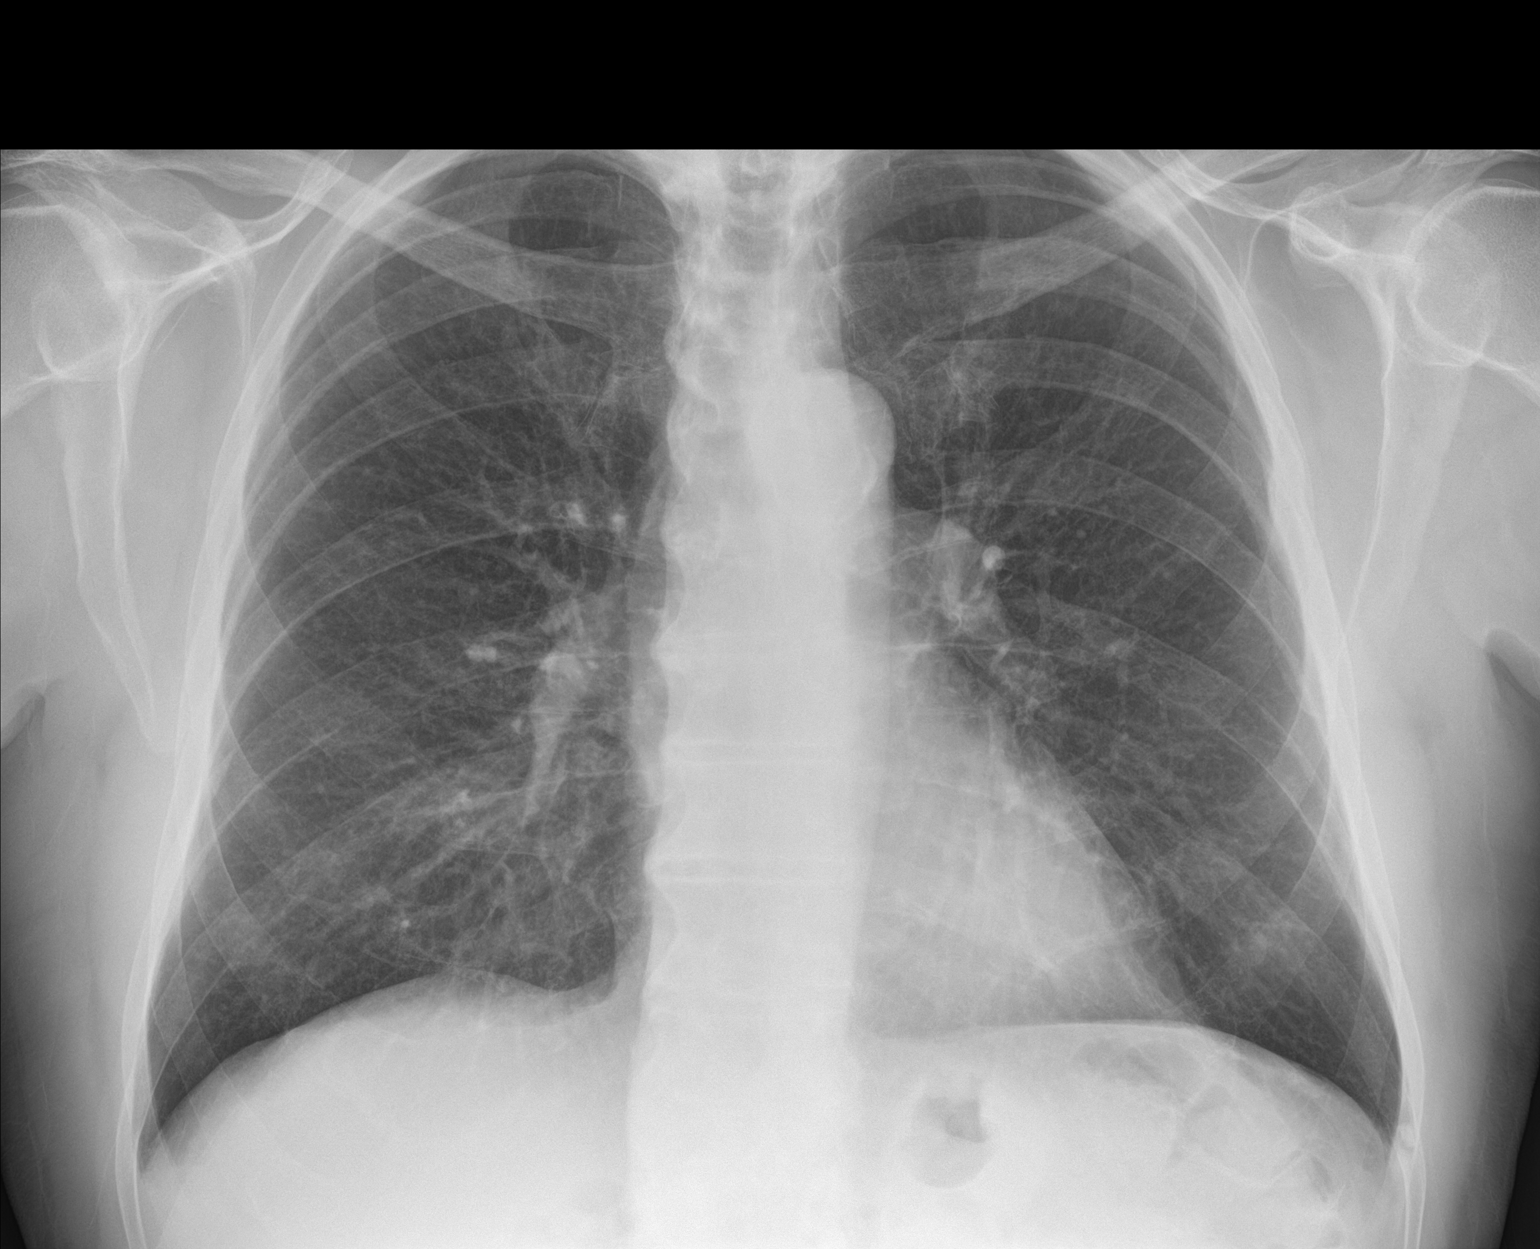

[chest lat]
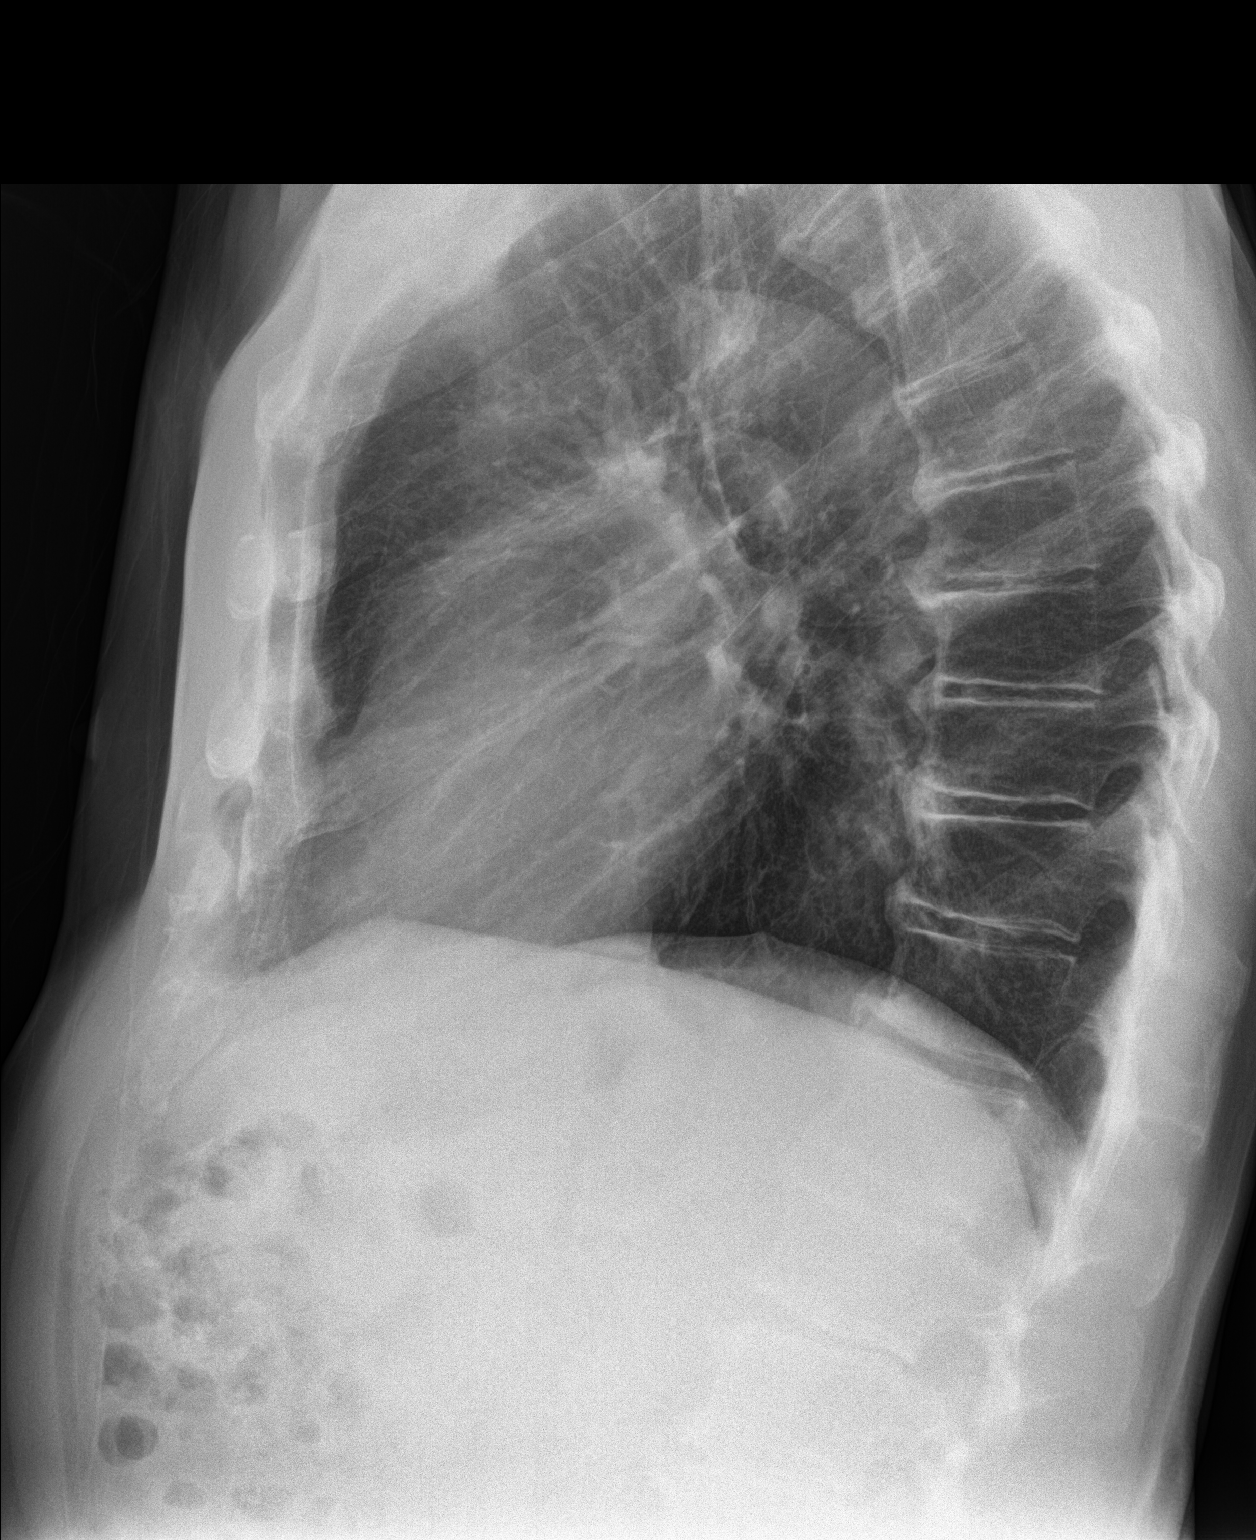

[2 of 2 positions shown; findings below may reference images not displayed]

FINDINGS: The cardiac silhouette, mediastinal and hilar contours are normal.
There is mild tortuosity and calcification of the thoracic aorta.
The lungs are clear. No infiltrates or effusions. Bilateral nipple
shadows are noted. The the bony thorax is intact.
IMPRESSION: No acute cardiopulmonary findings.

## 2018-09-11 ENCOUNTER — Ambulatory Visit: Payer: PPO | Admitting: Anesthesiology

## 2018-09-11 ENCOUNTER — Ambulatory Visit
Admission: RE | Admit: 2018-09-11 | Discharge: 2018-09-11 | Disposition: A | Discharge status: Home / Self Care | Payer: PPO | Source: Ambulatory Visit | Attending: Ophthalmology | Admitting: Ophthalmology

## 2018-09-11 ENCOUNTER — Encounter
Admission: RE | Disposition: A | Discharge status: Home / Self Care | Payer: Self-pay | Source: Ambulatory Visit | Attending: Ophthalmology

## 2018-09-11 DIAGNOSIS — I10 Essential (primary) hypertension: Secondary | ICD-10-CM | POA: Diagnosis not present

## 2018-09-11 DIAGNOSIS — Z7982 Long term (current) use of aspirin: Secondary | ICD-10-CM | POA: Diagnosis not present

## 2018-09-11 DIAGNOSIS — K219 Gastro-esophageal reflux disease without esophagitis: Secondary | ICD-10-CM | POA: Diagnosis not present

## 2018-09-11 DIAGNOSIS — N400 Enlarged prostate without lower urinary tract symptoms: Secondary | ICD-10-CM | POA: Diagnosis not present

## 2018-09-11 DIAGNOSIS — Z79899 Other long term (current) drug therapy: Secondary | ICD-10-CM | POA: Insufficient documentation

## 2018-09-11 DIAGNOSIS — H25812 Combined forms of age-related cataract, left eye: Secondary | ICD-10-CM | POA: Diagnosis not present

## 2018-09-11 DIAGNOSIS — Z87891 Personal history of nicotine dependence: Secondary | ICD-10-CM | POA: Insufficient documentation

## 2018-09-11 DIAGNOSIS — H2512 Age-related nuclear cataract, left eye: Secondary | ICD-10-CM | POA: Diagnosis not present

## 2018-09-11 HISTORY — PX: CATARACT EXTRACTION W/PHACO: SHX586

## 2018-09-11 SURGERY — PHACOEMULSIFICATION, CATARACT, WITH IOL INSERTION
Anesthesia: Monitor Anesthesia Care | Site: Eye | Laterality: Left

## 2018-09-11 MED ORDER — MOXIFLOXACIN HCL 0.5 % OP SOLN
1.0000 [drp] | OPHTHALMIC | Status: DC | PRN
  Administered 2018-09-11 (×3): 1 [drp] via OPHTHALMIC

## 2018-09-11 MED ORDER — ACETAMINOPHEN 160 MG/5ML PO SOLN: 325.0000 mg | ORAL | Status: DC | PRN

## 2018-09-11 MED ORDER — NA HYALUR & NA CHOND-NA HYALUR 0.4-0.35 ML IO KIT
PACK | INTRAOCULAR | Status: DC | PRN
  Administered 2018-09-11: 1 mL via INTRAOCULAR

## 2018-09-11 MED ORDER — BRIMONIDINE TARTRATE-TIMOLOL 0.2-0.5 % OP SOLN
OPHTHALMIC | Status: DC | PRN
  Administered 2018-09-11: 1 [drp] via OPHTHALMIC

## 2018-09-11 MED ORDER — EPINEPHRINE PF 1 MG/ML IJ SOLN
INTRAOCULAR | Status: DC | PRN
  Administered 2018-09-11: 97 mL via OPHTHALMIC

## 2018-09-11 MED ORDER — CEFUROXIME OPHTHALMIC INJECTION 1 MG/0.1 ML
INJECTION | OPHTHALMIC | Status: DC | PRN
  Administered 2018-09-11: 0.1 mL via INTRACAMERAL

## 2018-09-11 MED ORDER — LACTATED RINGERS IV SOLN: INTRAVENOUS | Status: DC

## 2018-09-11 MED ORDER — TETRACAINE HCL 0.5 % OP SOLN
1.0000 [drp] | OPHTHALMIC | Status: DC | PRN
  Administered 2018-09-11 (×3): 1 [drp] via OPHTHALMIC

## 2018-09-11 MED ORDER — FENTANYL CITRATE (PF) 100 MCG/2ML IJ SOLN
INTRAMUSCULAR | Status: DC | PRN
  Administered 2018-09-11: 50 ug via INTRAVENOUS

## 2018-09-11 MED ORDER — MIDAZOLAM HCL 2 MG/2ML IJ SOLN
INTRAMUSCULAR | Status: DC | PRN
  Administered 2018-09-11: 2 mg via INTRAVENOUS

## 2018-09-11 MED ORDER — ARMC OPHTHALMIC DILATING DROPS
1.0000 "application " | OPHTHALMIC | Status: DC | PRN
  Administered 2018-09-11 (×3): 1 via OPHTHALMIC

## 2018-09-11 MED ORDER — LIDOCAINE HCL (PF) 2 % IJ SOLN
INTRAOCULAR | Status: DC | PRN
  Administered 2018-09-11: 1 mL

## 2018-09-11 MED ORDER — ACETAMINOPHEN 325 MG PO TABS: 325.0000 mg | ORAL_TABLET | ORAL | Status: DC | PRN

## 2018-09-11 SURGICAL SUPPLY — 20 items
CANNULA ANT/CHMB 27G (MISCELLANEOUS) ×1 IMPLANT
CANNULA ANT/CHMB 27GA (MISCELLANEOUS) ×2 IMPLANT
GLOVE SURG LX 7.5 STRW (GLOVE) ×1
GLOVE SURG LX STRL 7.5 STRW (GLOVE) ×1 IMPLANT
GLOVE SURG TRIUMPH 8.0 PF LTX (GLOVE) ×2 IMPLANT
GOWN STRL REUS W/ TWL LRG LVL3 (GOWN DISPOSABLE) ×2 IMPLANT
GOWN STRL REUS W/TWL LRG LVL3 (GOWN DISPOSABLE) ×4
LENS IOL TECNIS ITEC 20.0 (Intraocular Lens) ×1 IMPLANT
MARKER SKIN DUAL TIP RULER LAB (MISCELLANEOUS) ×2 IMPLANT
NDL FILTER BLUNT 18X1 1/2 (NEEDLE) ×1 IMPLANT
NEEDLE FILTER BLUNT 18X 1/2SAF (NEEDLE) ×1
NEEDLE FILTER BLUNT 18X1 1/2 (NEEDLE) ×1 IMPLANT
PACK CATARACT BRASINGTON (MISCELLANEOUS) ×2 IMPLANT
PACK EYE AFTER SURG (MISCELLANEOUS) ×2 IMPLANT
PACK OPTHALMIC (MISCELLANEOUS) ×2 IMPLANT
SYR 3ML LL SCALE MARK (SYRINGE) ×2 IMPLANT
SYR 5ML LL (SYRINGE) ×2 IMPLANT
SYR TB 1ML LUER SLIP (SYRINGE) ×2 IMPLANT
WATER STERILE IRR 500ML POUR (IV SOLUTION) ×2 IMPLANT
WIPE NON LINTING 3.25X3.25 (MISCELLANEOUS) ×2 IMPLANT

## 2018-09-11 NOTE — H&P (Signed)

## 2018-09-11 NOTE — Transfer of Care (Signed)
Immediate Anesthesia Transfer of Care Note  Patient: Michael Decker  Procedure(s) Performed: CATARACT EXTRACTION PHACO AND INTRAOCULAR LENS PLACEMENT (IOC)  LEFT (Left Eye)  Patient Location: PACU  Anesthesia Type: MAC  Level of Consciousness: awake, alert  and patient cooperative  Airway and Oxygen Therapy: Patient Spontanous Breathing and Patient connected to supplemental oxygen  Post-op Assessment: Post-op Vital signs reviewed, Patient's Cardiovascular Status Stable, Respiratory Function Stable, Patent Airway and No signs of Nausea or vomiting  Post-op Vital Signs: Reviewed and stable  Complications: No apparent anesthesia complications

## 2018-09-11 NOTE — Op Note (Signed)
OPERATIVE NOTE  ROLEN CONGER [id number] 09/11/2018   PREOPERATIVE DIAGNOSIS:  Nuclear sclerotic cataract left eye. H25.12   POSTOPERATIVE DIAGNOSIS:    Nuclear sclerotic cataract left eye.     PROCEDURE:  Phacoemusification with posterior chamber intraocular lens placement of the left eye   LENS:   Implant Name Type Inv. Item Serial No. Manufacturer Lot No. LRB No. Used  LENS IOL DIOP 20.0 - [id number] Intraocular Lens LENS IOL DIOP 20.0 [id number]  Left 1        ULTRASOUND TIME: 15  % of 2 minutes 0 seconds, CDE 17.9  SURGEON:  Wyonia Hough, MD   ANESTHESIA:  Topical with tetracaine drops and 2% Xylocaine jelly, augmented with 1% preservative-free intracameral lidocaine.    COMPLICATIONS:  None.   DESCRIPTION OF PROCEDURE:  The patient was identified in the holding room and transported to the operating room and placed in the supine position under the operating microscope.  The left eye was identified as the operative eye and it was prepped and draped in the usual sterile ophthalmic fashion.   A 1 millimeter clear-corneal paracentesis was made at the 1:30 position.  0.5 ml of preservative-free 1% lidocaine was injected into the anterior chamber.  The anterior chamber was filled with Viscoat viscoelastic.  A 2.4 millimeter keratome was used to make a near-clear corneal incision at the 10:30 position.  .  A curvilinear capsulorrhexis was made with a cystotome and capsulorrhexis forceps.  Balanced salt solution was used to hydrodissect and hydrodelineate the nucleus.   Phacoemulsification was then used in stop and chop fashion to remove the lens nucleus and epinucleus.  The remaining cortex was then removed using the irrigation and aspiration handpiece. Provisc was then placed into the capsular bag to distend it for lens placement.  A lens was then injected into the capsular bag.  The remaining viscoelastic was aspirated.   Wounds were hydrated with balanced salt  solution.  The anterior chamber was inflated to a physiologic pressure with balanced salt solution.  No wound leaks were noted. Cefuroxime 0.1 ml of a 10mg /ml solution was injected into the anterior chamber for a dose of 1 mg of intracameral antibiotic at the completion of the case.   Timolol and Brimonidine drops were applied to the eye.  The patient was taken to the recovery room in stable condition without complications of anesthesia or surgery.  Kinslei Labine 09/11/2018, 12:09 PM

## 2018-09-11 NOTE — Anesthesia Preprocedure Evaluation (Signed)
Anesthesia Evaluation  Patient identified by MRN, date of birth, ID band Patient awake    Reviewed: Allergy & Precautions, H&P , NPO status , Patient's Chart, lab work & pertinent test results, reviewed documented beta blocker date and time   Airway Mallampati: II  TM Distance: >3 FB Neck ROM: full    Dental no notable dental hx.    Pulmonary former smoker,    Pulmonary exam normal breath sounds clear to auscultation       Cardiovascular Exercise Tolerance: Good hypertension,  Rhythm:regular Rate:Normal     Neuro/Psych negative neurological ROS  negative psych ROS   GI/Hepatic Neg liver ROS, GERD  ,  Endo/Other  negative endocrine ROS  Renal/GU negative Renal ROS  negative genitourinary   Musculoskeletal   Abdominal   Peds  Hematology negative hematology ROS (+)   Anesthesia Other Findings   Reproductive/Obstetrics negative OB ROS                             Anesthesia Physical Anesthesia Plan  ASA: II  Anesthesia Plan: MAC   Post-op Pain Management:    Induction:   PONV Risk Score and Plan:   Airway Management Planned:   Additional Equipment:   Intra-op Plan:   Post-operative Plan:   Informed Consent: I have reviewed the patients History and Physical, chart, labs and discussed the procedure including the risks, benefits and alternatives for the proposed anesthesia with the patient or authorized representative who has indicated his/her understanding and acceptance.     Dental Advisory Given  Plan Discussed with: CRNA and Anesthesiologist  Anesthesia Plan Comments:         Anesthesia Quick Evaluation

## 2018-09-11 NOTE — Anesthesia Postprocedure Evaluation (Signed)
Anesthesia Post Note  Patient: Michael Decker  Procedure(s) Performed: CATARACT EXTRACTION PHACO AND INTRAOCULAR LENS PLACEMENT (IOC)  LEFT (Left Eye)  Patient location during evaluation: PACU Anesthesia Type: MAC Level of consciousness: awake and alert Pain management: pain level controlled Vital Signs Assessment: post-procedure vital signs reviewed and stable Respiratory status: spontaneous breathing, nonlabored ventilation, respiratory function stable and patient connected to nasal cannula oxygen Cardiovascular status: stable and blood pressure returned to baseline Postop Assessment: no apparent nausea or vomiting Anesthetic complications: no    Trecia Rogers

## 2018-09-11 NOTE — Anesthesia Procedure Notes (Signed)
Procedure Name: MAC Performed by: Brittan Mapel, CRNA Pre-anesthesia Checklist: Patient identified, Emergency Drugs available, Suction available, Timeout performed and Patient being monitored Patient Re-evaluated:Patient Re-evaluated prior to induction Oxygen Delivery Method: Nasal cannula Placement Confirmation: positive ETCO2       

## 2018-09-14 ENCOUNTER — Other Ambulatory Visit: Payer: Self-pay | Admitting: Family Medicine

## 2018-09-29 ENCOUNTER — Other Ambulatory Visit: Payer: Self-pay | Admitting: Family Medicine

## 2018-10-09 DIAGNOSIS — D2261 Melanocytic nevi of right upper limb, including shoulder: Secondary | ICD-10-CM | POA: Diagnosis not present

## 2018-10-09 DIAGNOSIS — D2272 Melanocytic nevi of left lower limb, including hip: Secondary | ICD-10-CM | POA: Diagnosis not present

## 2018-10-09 DIAGNOSIS — D225 Melanocytic nevi of trunk: Secondary | ICD-10-CM | POA: Diagnosis not present

## 2018-10-09 DIAGNOSIS — X32XXXA Exposure to sunlight, initial encounter: Secondary | ICD-10-CM | POA: Diagnosis not present

## 2018-10-09 DIAGNOSIS — D2271 Melanocytic nevi of right lower limb, including hip: Secondary | ICD-10-CM | POA: Diagnosis not present

## 2018-10-09 DIAGNOSIS — L718 Other rosacea: Secondary | ICD-10-CM | POA: Diagnosis not present

## 2018-10-09 DIAGNOSIS — D2262 Melanocytic nevi of left upper limb, including shoulder: Secondary | ICD-10-CM | POA: Diagnosis not present

## 2018-10-09 DIAGNOSIS — L570 Actinic keratosis: Secondary | ICD-10-CM | POA: Diagnosis not present

## 2018-10-09 DIAGNOSIS — L821 Other seborrheic keratosis: Secondary | ICD-10-CM | POA: Diagnosis not present

## 2019-01-03 DIAGNOSIS — Z961 Presence of intraocular lens: Secondary | ICD-10-CM | POA: Diagnosis not present

## 2019-01-03 DIAGNOSIS — M1712 Unilateral primary osteoarthritis, left knee: Secondary | ICD-10-CM | POA: Diagnosis not present

## 2019-01-10 DIAGNOSIS — M1712 Unilateral primary osteoarthritis, left knee: Secondary | ICD-10-CM | POA: Diagnosis not present

## 2019-01-17 DIAGNOSIS — M1712 Unilateral primary osteoarthritis, left knee: Secondary | ICD-10-CM | POA: Diagnosis not present

## 2019-02-20 ENCOUNTER — Other Ambulatory Visit: Payer: Self-pay

## 2019-03-14 ENCOUNTER — Telehealth: Payer: Self-pay | Admitting: Family Medicine

## 2019-03-14 NOTE — Telephone Encounter (Signed)
Pt's wife requests letter for [facility]  Reviewed risks of indoor exercise in setting of Covid pandemic.  Wife still desires this.

## 2019-03-29 ENCOUNTER — Other Ambulatory Visit: Payer: Self-pay | Admitting: Family Medicine

## 2019-04-08 ENCOUNTER — Ambulatory Visit (INDEPENDENT_AMBULATORY_CARE_PROVIDER_SITE_OTHER): Payer: PPO

## 2019-04-08 DIAGNOSIS — Z23 Encounter for immunization: Secondary | ICD-10-CM

## 2019-05-12 DIAGNOSIS — M461 Sacroiliitis, not elsewhere classified: Secondary | ICD-10-CM | POA: Diagnosis not present

## 2019-05-12 DIAGNOSIS — M533 Sacrococcygeal disorders, not elsewhere classified: Secondary | ICD-10-CM | POA: Diagnosis not present

## 2019-05-15 DIAGNOSIS — L821 Other seborrheic keratosis: Secondary | ICD-10-CM | POA: Diagnosis not present

## 2019-05-15 DIAGNOSIS — D485 Neoplasm of uncertain behavior of skin: Secondary | ICD-10-CM | POA: Diagnosis not present

## 2019-05-20 DIAGNOSIS — M25512 Pain in left shoulder: Secondary | ICD-10-CM | POA: Diagnosis not present

## 2019-05-20 DIAGNOSIS — M778 Other enthesopathies, not elsewhere classified: Secondary | ICD-10-CM | POA: Diagnosis not present

## 2019-05-20 DIAGNOSIS — M6281 Muscle weakness (generalized): Secondary | ICD-10-CM | POA: Diagnosis not present

## 2019-05-20 DIAGNOSIS — G8929 Other chronic pain: Secondary | ICD-10-CM | POA: Diagnosis not present

## 2019-05-26 DIAGNOSIS — M778 Other enthesopathies, not elsewhere classified: Secondary | ICD-10-CM | POA: Diagnosis not present

## 2019-07-06 ENCOUNTER — Other Ambulatory Visit: Payer: Self-pay | Admitting: Family Medicine

## 2019-07-06 DIAGNOSIS — E785 Hyperlipidemia, unspecified: Secondary | ICD-10-CM

## 2019-07-06 DIAGNOSIS — R351 Nocturia: Secondary | ICD-10-CM

## 2019-07-06 DIAGNOSIS — N401 Enlarged prostate with lower urinary tract symptoms: Secondary | ICD-10-CM

## 2019-07-06 DIAGNOSIS — G6289 Other specified polyneuropathies: Secondary | ICD-10-CM

## 2019-07-07 ENCOUNTER — Other Ambulatory Visit (INDEPENDENT_AMBULATORY_CARE_PROVIDER_SITE_OTHER): Payer: PPO

## 2019-07-07 ENCOUNTER — Other Ambulatory Visit: Payer: Self-pay

## 2019-07-07 DIAGNOSIS — G6289 Other specified polyneuropathies: Secondary | ICD-10-CM

## 2019-07-07 DIAGNOSIS — E785 Hyperlipidemia, unspecified: Secondary | ICD-10-CM | POA: Diagnosis not present

## 2019-07-07 DIAGNOSIS — N401 Enlarged prostate with lower urinary tract symptoms: Secondary | ICD-10-CM | POA: Diagnosis not present

## 2019-07-07 DIAGNOSIS — R351 Nocturia: Secondary | ICD-10-CM | POA: Diagnosis not present

## 2019-07-07 DIAGNOSIS — H47321 Drusen of optic disc, right eye: Secondary | ICD-10-CM | POA: Diagnosis not present

## 2019-07-07 LAB — LIPID PANEL
Cholesterol: 174 mg/dL (ref 0–200)
HDL: 55.1 mg/dL (ref >39.00)
LDL Cholesterol: 94 mg/dL (ref 0–99)
NonHDL: 118.74
Total CHOL/HDL Ratio: 3
Triglycerides: 124 mg/dL (ref 0.0–149.0)
VLDL: 24.8 mg/dL (ref 0.0–40.0)

## 2019-07-07 LAB — COMPREHENSIVE METABOLIC PANEL
ALT: 16 U/L (ref 0–53)
AST: 21 U/L (ref 0–37)
Albumin: 3.7 g/dL (ref 3.5–5.2)
Alkaline Phosphatase: 63 U/L (ref 39–117)
BUN: 20 mg/dL (ref 6–23)
CO2: 30 mEq/L (ref 19–32)
Calcium: 9.1 mg/dL (ref 8.4–10.5)
Chloride: 104 mEq/L (ref 96–112)
Creatinine, Ser: 1.18 mg/dL (ref 0.40–1.50)
GFR: 58.92 mL/min — ABNORMAL LOW (ref >60.00)
Glucose, Bld: 91 mg/dL (ref 70–99)
Potassium: 4.4 mEq/L (ref 3.5–5.1)
Sodium: 140 mEq/L (ref 135–145)
Total Bilirubin: 0.8 mg/dL (ref 0.2–1.2)
Total Protein: 6 g/dL (ref 6.0–8.3)

## 2019-07-07 LAB — VITAMIN B12: Vitamin B-12: 330 pg/mL (ref 211–911)

## 2019-07-07 LAB — PSA: PSA: 0.56 ng/mL (ref 0.10–4.00)

## 2019-07-10 ENCOUNTER — Ambulatory Visit: Payer: PPO | Admitting: Urology

## 2019-07-14 ENCOUNTER — Ambulatory Visit (INDEPENDENT_AMBULATORY_CARE_PROVIDER_SITE_OTHER): Payer: PPO | Admitting: Family Medicine

## 2019-07-14 ENCOUNTER — Encounter: Payer: Self-pay | Admitting: Family Medicine

## 2019-07-14 ENCOUNTER — Other Ambulatory Visit: Payer: Self-pay

## 2019-07-14 VITALS — BP 138/68 | HR 68 | Temp 97.6°F | Ht 70.0 in | Wt 184.2 lb

## 2019-07-14 DIAGNOSIS — N5082 Scrotal pain: Secondary | ICD-10-CM

## 2019-07-14 DIAGNOSIS — J3489 Other specified disorders of nose and nasal sinuses: Secondary | ICD-10-CM

## 2019-07-14 DIAGNOSIS — M1712 Unilateral primary osteoarthritis, left knee: Secondary | ICD-10-CM

## 2019-07-14 DIAGNOSIS — R351 Nocturia: Secondary | ICD-10-CM

## 2019-07-14 DIAGNOSIS — E785 Hyperlipidemia, unspecified: Secondary | ICD-10-CM

## 2019-07-14 DIAGNOSIS — N401 Enlarged prostate with lower urinary tract symptoms: Secondary | ICD-10-CM

## 2019-07-14 DIAGNOSIS — Z0000 Encounter for general adult medical examination without abnormal findings: Secondary | ICD-10-CM

## 2019-07-14 DIAGNOSIS — Z7189 Other specified counseling: Secondary | ICD-10-CM

## 2019-07-14 DIAGNOSIS — I10 Essential (primary) hypertension: Secondary | ICD-10-CM

## 2019-07-14 LAB — POC URINALSYSI DIPSTICK (AUTOMATED)
Bilirubin, UA: NEGATIVE
Blood, UA: NEGATIVE
Glucose, UA: NEGATIVE
Ketones, UA: NEGATIVE
Leukocytes, UA: NEGATIVE
Nitrite, UA: NEGATIVE
Protein, UA: NEGATIVE
Spec Grav, UA: 1.015 (ref 1.010–1.025)
Urobilinogen, UA: 0.2 E.U./dL
pH, UA: 6 (ref 5.0–8.0)

## 2019-07-14 MED ORDER — DUTASTERIDE 0.5 MG PO CAPS: ORAL_CAPSULE | ORAL | 3 refills | Status: DC

## 2019-07-14 MED ORDER — TELMISARTAN 80 MG PO TABS: 80.0000 mg | ORAL_TABLET | Freq: Every day | ORAL | 3 refills | Status: DC

## 2019-07-14 MED ORDER — AZELASTINE HCL 0.1 % NA SOLN: 1.0000 | Freq: Two times a day (BID) | NASAL | 3 refills | Status: DC

## 2019-07-14 NOTE — Assessment & Plan Note (Signed)
Chronic, stable on krill oil. The ASCVD Risk score [name] DC Jr., et al., 2013) failed to calculate for the following reasons:   The 2013 ASCVD risk score is only valid for ages 24 to 62

## 2019-07-14 NOTE — Assessment & Plan Note (Signed)
Seeing [facility] s/p steroid injections and viscosupplementation x2.

## 2019-07-14 NOTE — Assessment & Plan Note (Signed)
Reassuring exam. Offered scrotal US - declines. He will f/u with uro if pain persists.

## 2019-07-14 NOTE — Assessment & Plan Note (Signed)

## 2019-07-14 NOTE — Patient Instructions (Addendum)
Urinalysis today. If ongoing scrotal pain, return to see [facility].  For runny nose - try flonase (OTC) or astelin nasal spray - Rx printed today. If interested, check with pharmacy about new 2 shot shingles series (shingrix).  You are doing well today Return as needed or in 1 year for next physical.   Health Maintenance After Age 83 After age 46, you are at a higher risk for certain long-term diseases and infections as well as injuries from falls. Falls are a major cause of broken bones and head injuries in people who are older than age 35. Getting regular preventive care can help to keep you healthy and well. Preventive care includes getting regular testing and making lifestyle changes as recommended by your health care provider. Talk with your health care provider about:  Which screenings and tests you should have. A screening is a test that checks for a disease when you have no symptoms.  A diet and exercise plan that is right for you. What should I know about screenings and tests to prevent falls? Screening and testing are the best ways to find a health problem early. Early diagnosis and treatment give you the best chance of managing medical conditions that are common after age 44. Certain conditions and lifestyle choices may make you more likely to have a fall. Your health care provider may recommend:  Regular vision checks. Poor vision and conditions such as cataracts can make you more likely to have a fall. If you wear glasses, make sure to get your prescription updated if your vision changes.  Medicine review. Work with your health care provider to regularly review all of the medicines you are taking, including over-the-counter medicines. Ask your health care provider about any side effects that may make you more likely to have a fall. Tell your health care provider if any medicines that you take make you feel dizzy or sleepy.  Osteoporosis screening. Osteoporosis is a condition  that causes the bones to get weaker. This can make the bones weak and cause them to break more easily.  Blood pressure screening. Blood pressure changes and medicines to control blood pressure can make you feel dizzy.  Strength and balance checks. Your health care provider may recommend certain tests to check your strength and balance while standing, walking, or changing positions.  Foot health exam. Foot pain and numbness, as well as not wearing proper footwear, can make you more likely to have a fall.  Depression screening. You may be more likely to have a fall if you have a fear of falling, feel emotionally low, or feel unable to do activities that you used to do.  Alcohol use screening. Using too much alcohol can affect your balance and may make you more likely to have a fall. What actions can I take to lower my risk of falls? General instructions  Talk with your health care provider about your risks for falling. Tell your health care provider if: ? You fall. Be sure to tell your health care provider about all falls, even ones that seem minor. ? You feel dizzy, sleepy, or off-balance.  Take over-the-counter and prescription medicines only as told by your health care provider. These include any supplements.  Eat a healthy diet and maintain a healthy weight. A healthy diet includes low-fat dairy products, low-fat (lean) meats, and fiber from whole grains, beans, and lots of fruits and vegetables. Home safety  Remove any tripping hazards, such as rugs, cords, and clutter.  Install  safety equipment such as grab bars in bathrooms and safety rails on stairs.  Keep rooms and walkways well-lit. Activity   Follow a regular exercise program to stay fit. This will help you maintain your balance. Ask your health care provider what types of exercise are appropriate for you.  If you need a cane or walker, use it as recommended by your health care provider.  Wear supportive shoes that have  nonskid soles. Lifestyle  Do not drink alcohol if your health care provider tells you not to drink.  If you drink alcohol, limit how much you have: ? 0-1 drink a day for women. ? 0-2 drinks a day for men.  Be aware of how much alcohol is in your drink. In the [place] one drink equals one typical bottle of beer (12 oz), one-half glass of wine (5 oz), or one shot of hard liquor (1 oz).  Do not use any products that contain nicotine or tobacco, such as cigarettes and e-cigarettes. If you need help quitting, ask your health care provider. Summary  Having a healthy lifestyle and getting preventive care can help to protect your health and wellness after age 66.  Screening and testing are the best way to find a health problem early and help you avoid having a fall. Early diagnosis and treatment give you the best chance for managing medical conditions that are more common for people who are older than age 50.  Falls are a major cause of broken bones and head injuries in people who are older than age 14. Take precautions to prevent a fall at home.  Work with your health care provider to learn what changes you can make to improve your health and wellness and to prevent falls. This information is not intended to replace advice given to you by your health care provider. Make sure you discuss any questions you have with your health care provider. Document Released: 05/23/2017 Document Revised: 10/31/2018 Document Reviewed: 05/23/2017 Elsevier Patient Education  2020 [employer].

## 2019-07-14 NOTE — Assessment & Plan Note (Signed)
Continue claritin daily. Will trial flonase vs astelin (Rx provided today)

## 2019-07-14 NOTE — Addendum Note (Signed)
Addended by: Brenton Grills on: [id number] 03:35 PM   Modules accepted: Orders

## 2019-07-14 NOTE — Progress Notes (Signed)
This visit was conducted in person.  BP 138/68 (BP Location: Right Arm, Patient Position: Sitting, Cuff Size: Normal)   Pulse 68   Temp 97.6 F (36.4 C) (Temporal)   Ht 5\' 10"  (1.778 m)   Wt 184 lb 4 oz (83.6 kg)   SpO2 96%   BMI 26.44 kg/m    CC: CPE/AMW Subjective:    Patient ID: Michael Decker, male    DOB: 1934-02-21, 85 y.o.   MRN: [id number]  HPI: Michael Decker is a 85 y.o. male presenting on 07/14/2019 for Medicare Wellness and Medication Refill (Requests 90 tabs of dutasteride. )    Did not see health advisor this year.    Hearing Screening   125Hz  250Hz  500Hz  1000Hz  2000Hz  3000Hz  4000Hz  6000Hz  8000Hz   Right ear:           Left ear:           Comments: Wears left ear hearing aid.  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   Vision Screening Comments: Last eye exam, 06/2019.    Office Visit from 07/14/2019 in [place] at [facility] Total Score  0    h/o R ear hearing loss at age 69mo after measles infection.  Fall Risk  07/14/2019 02/20/2019 07/10/2018 06/29/2017 06/28/2016  Falls in the past year? 0 0 0 No No  Comment - Emmi Telephone Survey: data to providers prior to load - - -      Ongoing L knee pain, has seen [facility] s/p steroid injections with benefit. Received visco-supplementation x2 series with marked benefit. Uses knee brace with benefit.   Notes worsening arthritis of hands, but mangaeable.   Noticing worsening rhinorrhea despite daily claritin. Neti pot helps. Afrin helps - uses temporarily when on flights.   3 wk h/o R testicular pain without bulge. Using jock strap is helpful.   Preventative: COLONOSCOPY 08/2018TA, diverticulosis, int hem no f/u needed Henrene Pastor) Prostate -previously seenby Dr Alinda Money - h/o BPH. On avodart.Would like PSA screen. Nocturia 2-3, weak stream.  Flushot yearly Pneumovax 2002and 2017, prevnar 2015 Tdap 2010  zostavax 2013 shingrix - discussed  Advanced planning - HCPOA and advanced  directive scanned in chart 06/2015. Wife Pat then Merideth Abbey then Ellard Artis are [facility]. Seat belt use discussed  Sunscreen use discussed. No changing moles on skin.  Ex smoker (cigars)  Alcohol - one drink a day Dentist yearly  Eye exam yearly  Bowel - no constipation  Bladder - no incontinence  Lives with wife Occupation: prior worked with Clinical cytogeneticist as Recruitment consultant; volunteers at [employer] in [place], [place]: BS  Activity: limited gym activity ([employer]) due to pandemic  Diet: some water, fruits/vegetables daily     Relevant past medical, surgical, family and social history reviewed and updated as indicated. Interim medical history since our last visit reviewed. Allergies and medications reviewed and updated. Outpatient Medications Prior to Visit  Medication Sig Dispense Refill  . Ascorbic Acid (VITAMIN C PO) Take by mouth 2 (two) times daily.    Marland Kitchen aspirin EC 81 MG tablet Take 1 tablet (81 mg total) by mouth every Monday, Wednesday, and Friday.    . Glucosamine-Chondroitin (OSTEO BI-FLEX REGULAR STRENGTH PO) Take by mouth daily.     Sheketa Ende Docker Oil 500 MG CAPS Take 1 capsule by mouth.    . loratadine (ALLERGY RELIEF) 10 MG tablet Take 10 mg by mouth daily.    . Magnesium 400 MG CAPS Take  1 tablet by mouth every other day. (Patient taking differently: Take 1 tablet by mouth daily. )    . Multiple Vitamins-Minerals (MULTIVITAMIN WITH MINERALS) tablet Take 1 tablet by mouth daily.    Marland Kitchen omeprazole (PRILOSEC) 40 MG capsule Take 1 capsule (40 mg total) by mouth daily as needed. 30 capsule 6  . Potassium Gluconate 550 MG TABS Take 1 tablet at bedtime by mouth.    . Turmeric 500 MG CAPS Take 1 capsule at bedtime by mouth.    . dutasteride (AVODART) 0.5 MG capsule TAKE 1 CAPSULE BY MOUTH EVERY OTHER DAY 45 capsule 2  . telmisartan (MICARDIS) 80 MG tablet TAKE 1 TABLET BY MOUTH DAILY 90 tablet 3  . cyanocobalamin (V-R VITAMIN B-12) 500 MCG tablet Take 1 tablet (500 mcg total)  by mouth daily. (Patient taking differently: Take 500 mcg by mouth daily. As needed)     No facility-administered medications prior to visit.     Per HPI unless specifically indicated in ROS section below Review of Systems  Constitutional: Negative for activity change, appetite change, chills, fatigue, fever and unexpected weight change.  HENT: Negative for hearing loss.   Eyes: Negative for visual disturbance.  Respiratory: Negative for cough, chest tightness, shortness of breath and wheezing.   Cardiovascular: Negative for chest pain, palpitations and leg swelling.  Gastrointestinal: Negative for abdominal distention, abdominal pain, blood in stool, constipation, diarrhea, nausea and vomiting.  Genitourinary: Negative for difficulty urinating and hematuria.  Musculoskeletal: Negative for arthralgias, myalgias and neck pain.  Skin: Negative for rash.  Neurological: Negative for dizziness, seizures, syncope and headaches.       Mild unsteadiness  Hematological: Negative for adenopathy. Bruises/bleeds easily.  Psychiatric/Behavioral: Negative for dysphoric mood. The patient is not nervous/anxious.    Objective:    BP 138/68 (BP Location: Right Arm, Patient Position: Sitting, Cuff Size: Normal)   Pulse 68   Temp 97.6 F (36.4 C) (Temporal)   Ht 5\' 10"  (1.778 m)   Wt 184 lb 4 oz (83.6 kg)   SpO2 96%   BMI 26.44 kg/m   Wt Readings from Last 3 Encounters:  07/14/19 184 lb 4 oz (83.6 kg)  09/11/18 183 lb (83 kg)  08/14/18 179 lb 12.8 oz (81.6 kg)    Physical Exam Vitals and nursing note reviewed.  Constitutional:      General: He is not in acute distress.    Appearance: Normal appearance. He is well-developed. He is not ill-appearing.  HENT:     Head: Normocephalic and atraumatic.     Right Ear: Hearing, tympanic membrane, ear canal and external ear normal.     Left Ear: Hearing, tympanic membrane, ear canal and external ear normal.     Nose: Nose normal.     Mouth/Throat:      Pharynx: Uvula midline.  Eyes:     General: No scleral icterus.    Extraocular Movements: Extraocular movements intact.     Conjunctiva/sclera: Conjunctivae normal.     Pupils: Pupils are equal, round, and reactive to light.  Neck:     Thyroid: No thyromegaly or thyroid tenderness.     Vascular: No carotid bruit.  Cardiovascular:     Rate and Rhythm: Normal rate and regular rhythm.     Pulses: Normal pulses.          Radial pulses are 2+ on the right side and 2+ on the left side.     Heart sounds: Normal heart sounds. No murmur.  Pulmonary:  Effort: Pulmonary effort is normal. No respiratory distress.     Breath sounds: Normal breath sounds. No wheezing, rhonchi or rales.  Abdominal:     General: Abdomen is flat. Bowel sounds are normal. There is no distension.     Palpations: Abdomen is soft. There is no mass.     Tenderness: There is no abdominal tenderness. There is no guarding or rebound.     Hernia: No hernia is present. There is no hernia in the left inguinal area or right inguinal area.  Genitourinary:    Pubic Area: No rash.      Penis: Normal and circumcised.      Testes: Normal.        Right: Mass, tenderness or swelling not present.        Left: Mass, tenderness or swelling not present.     Epididymis:     Right: Normal.     Left: Normal.  Musculoskeletal:        General: Normal range of motion.     Cervical back: Normal range of motion and neck supple.     Right lower leg: No edema.     Left lower leg: No edema.  Lymphadenopathy:     Cervical: No cervical adenopathy.     Lower Body: No right inguinal adenopathy. No left inguinal adenopathy.  Skin:    General: Skin is warm and dry.     Findings: No rash.  Neurological:     General: No focal deficit present.     Mental Status: He is alert and oriented to person, place, and time.     Comments:  CN grossly intact, station and gait intact Recall 3/3 Calculation 5/5 DLROW  Psychiatric:        Mood and  Affect: Mood normal.        Behavior: Behavior normal.        Thought Content: Thought content normal.        Judgment: Judgment normal.       Results for orders placed or performed in visit on 07/07/19  PSA  Result Value Ref Range   PSA 0.56 0.10 - 4.00 ng/mL  Vitamin B12  Result Value Ref Range   Vitamin B-12 330 211 - 911 pg/mL  Comprehensive metabolic panel  Result Value Ref Range   Sodium 140 135 - 145 mEq/L   Potassium 4.4 3.5 - 5.1 mEq/L   Chloride 104 96 - 112 mEq/L   CO2 30 19 - 32 mEq/L   Glucose, Bld 91 70 - 99 mg/dL   BUN 20 6 - 23 mg/dL   Creatinine, Ser 1.18 0.40 - 1.50 mg/dL   Total Bilirubin 0.8 0.2 - 1.2 mg/dL   Alkaline Phosphatase 63 39 - 117 U/L   AST 21 0 - 37 U/L   ALT 16 0 - 53 U/L   Total Protein 6.0 6.0 - 8.3 g/dL   Albumin 3.7 3.5 - 5.2 g/dL   GFR 58.92 (L) >60.00 mL/min   Calcium 9.1 8.4 - 10.5 mg/dL  Lipid panel  Result Value Ref Range   Cholesterol 174 0 - 200 mg/dL   Triglycerides 124.0 0.0 - 149.0 mg/dL   HDL 55.10 >39.00 mg/dL   VLDL 24.8 0.0 - 40.0 mg/dL   LDL Cholesterol 94 0 - 99 mg/dL   Total CHOL/HDL Ratio 3    NonHDL 118.74    Assessment & Plan:  This visit occurred during the SARS-CoV-2 public health emergency.  Safety protocols were in place,  including screening questions prior to the visit, additional usage of staff PPE, and extensive cleaning of exam room while observing appropriate contact time as indicated for disinfecting solutions.   Problem List Items Addressed This Visit    Scrotal pain    Reassuring exam. Offered scrotal US - declines. He will f/u with uro if pain persists.       Rhinorrhea    Continue claritin daily. Will trial flonase vs astelin (Rx provided today)      Medicare annual wellness visit, subsequent - Primary    I have personally reviewed the Medicare Annual Wellness questionnaire and have noted 1. The patient's medical and social history 2. Their use of alcohol, tobacco or illicit drugs 3. Their  current medications and supplements 4. The patient's functional ability including ADL's, fall risks, home safety risks and hearing or visual impairment. Cognitive function has been assessed and addressed as indicated.  5. Diet and physical activity 6. Evidence for depression or mood disorders The patients weight, height, BMI have been recorded in the chart. I have made referrals, counseling and provided education to the patient based on review of the above and I have provided the pt with a written personalized care plan for preventive services. Provider list updated.. See scanned questionairre as needed for further documentation. Reviewed preventative protocols and updated unless pt declined.       HTN (hypertension)    Chronic, stable. Continue micardis.       Relevant Medications   telmisartan (MICARDIS) 80 MG tablet   HLD (hyperlipidemia)    Chronic, stable on krill oil. The ASCVD Risk score [name] DC Jr., et al., 2013) failed to calculate for the following reasons:   The 2013 ASCVD risk score is only valid for ages 30 to 43       Relevant Medications   telmisartan (MICARDIS) 80 MG tablet   Health maintenance examination    Preventative protocols reviewed and updated unless pt declined. Discussed healthy diet and lifestyle.       Benign prostatic hyperplasia    Stable period on avodart - continue.      Relevant Medications   dutasteride (AVODART) 0.5 MG capsule   Arthritis of left knee    Seeing [facility] s/p steroid injections and viscosupplementation x2.       Advanced care planning/counseling discussion    Advanced planning - HCPOA and advanced directive scanned in chart 06/2015. Wife Pat then Merideth Abbey then Ellard Artis are [facility].          Meds ordered this encounter  Medications  . dutasteride (AVODART) 0.5 MG capsule    Sig: TAKE 1 CAPSULE BY MOUTH EVERY OTHER DAY    Dispense:  45 capsule    Refill:  3  . telmisartan (MICARDIS) 80 MG tablet     Sig: Take 1 tablet (80 mg total) by mouth daily.    Dispense:  90 tablet    Refill:  3  . azelastine (ASTELIN) 0.1 % nasal spray    Sig: Place 1 spray into both nostrils 2 (two) times daily. Use in each nostril as directed    Dispense:  30 mL    Refill:  3   No orders of the defined types were placed in this encounter.   Follow up plan: Return in about 1 year (around 07/13/2020) for annual exam, prior fasting for blood work, medicare wellness visit.  Ria Bush, MD

## 2019-07-14 NOTE — Assessment & Plan Note (Signed)
Chronic, stable. Continue micardis.

## 2019-07-14 NOTE — Assessment & Plan Note (Signed)
Stable period on avodart - continue.

## 2019-07-14 NOTE — Assessment & Plan Note (Signed)
Advanced planning - HCPOA and advanced directive scanned in chart 06/2015. Wife Pat then Merideth Abbey then Ellard Artis are [facility].

## 2019-07-14 NOTE — Assessment & Plan Note (Signed)
Preventative protocols reviewed and updated unless pt declined. Discussed healthy diet and lifestyle.  

## 2019-08-11 DIAGNOSIS — M1712 Unilateral primary osteoarthritis, left knee: Secondary | ICD-10-CM | POA: Diagnosis not present

## 2019-08-13 ENCOUNTER — Encounter: Payer: Self-pay | Admitting: Family Medicine

## 2019-08-18 DIAGNOSIS — M1712 Unilateral primary osteoarthritis, left knee: Secondary | ICD-10-CM | POA: Diagnosis not present

## 2019-08-25 DIAGNOSIS — M1712 Unilateral primary osteoarthritis, left knee: Secondary | ICD-10-CM | POA: Diagnosis not present

## 2019-12-08 DIAGNOSIS — M545 Low back pain: Secondary | ICD-10-CM | POA: Diagnosis not present

## 2019-12-08 DIAGNOSIS — M461 Sacroiliitis, not elsewhere classified: Secondary | ICD-10-CM | POA: Diagnosis not present

## 2019-12-08 DIAGNOSIS — M533 Sacrococcygeal disorders, not elsewhere classified: Secondary | ICD-10-CM | POA: Diagnosis not present

## 2019-12-19 ENCOUNTER — Emergency Department: Payer: PPO

## 2019-12-19 ENCOUNTER — Other Ambulatory Visit: Payer: Self-pay

## 2019-12-19 ENCOUNTER — Emergency Department
Admission: EM | Admit: 2019-12-19 | Discharge: 2019-12-19 | Disposition: A | Discharge status: Home / Self Care | Payer: PPO | Attending: Emergency Medicine | Admitting: Emergency Medicine

## 2019-12-19 DIAGNOSIS — Z7982 Long term (current) use of aspirin: Secondary | ICD-10-CM | POA: Diagnosis not present

## 2019-12-19 DIAGNOSIS — M5489 Other dorsalgia: Secondary | ICD-10-CM | POA: Diagnosis not present

## 2019-12-19 DIAGNOSIS — Z87891 Personal history of nicotine dependence: Secondary | ICD-10-CM | POA: Diagnosis not present

## 2019-12-19 DIAGNOSIS — I10 Essential (primary) hypertension: Secondary | ICD-10-CM | POA: Insufficient documentation

## 2019-12-19 DIAGNOSIS — M533 Sacrococcygeal disorders, not elsewhere classified: Secondary | ICD-10-CM

## 2019-12-19 DIAGNOSIS — R52 Pain, unspecified: Secondary | ICD-10-CM | POA: Diagnosis not present

## 2019-12-19 DIAGNOSIS — M549 Dorsalgia, unspecified: Secondary | ICD-10-CM | POA: Diagnosis not present

## 2019-12-19 DIAGNOSIS — M545 Low back pain: Secondary | ICD-10-CM | POA: Diagnosis not present

## 2019-12-19 DIAGNOSIS — M1611 Unilateral primary osteoarthritis, right hip: Secondary | ICD-10-CM | POA: Diagnosis not present

## 2019-12-19 DIAGNOSIS — Z79899 Other long term (current) drug therapy: Secondary | ICD-10-CM | POA: Diagnosis not present

## 2019-12-19 LAB — URINALYSIS, COMPLETE (UACMP) WITH MICROSCOPIC
Bacteria, UA: NONE SEEN
Bilirubin Urine: NEGATIVE
Glucose, UA: NEGATIVE mg/dL
Hgb urine dipstick: NEGATIVE
Ketones, ur: NEGATIVE mg/dL
Leukocytes,Ua: NEGATIVE
Nitrite: NEGATIVE
Protein, ur: NEGATIVE mg/dL
Specific Gravity, Urine: 1.019 (ref 1.005–1.030)
Squamous Epithelial / HPF: NONE SEEN (ref 0–5)
pH: 6 (ref 5.0–8.0)

## 2019-12-19 MED ORDER — DIAZEPAM 2 MG PO TABS
2.0000 mg | ORAL_TABLET | Freq: Once | ORAL | Status: AC
  Administered 2019-12-19: 2 mg via ORAL
  Filled 2019-12-19: qty 1

## 2019-12-19 MED ORDER — LIDOCAINE 5 % EX PTCH
1.0000 | MEDICATED_PATCH | CUTANEOUS | 0 refills | Status: DC
Start: 2019-12-19 — End: 2020-05-25

## 2019-12-19 MED ORDER — MELOXICAM 7.5 MG PO TABS: 7.5000 mg | ORAL_TABLET | Freq: Every day | ORAL | 0 refills | Status: AC

## 2019-12-19 MED ORDER — LIDOCAINE 5 % EX PTCH
1.0000 | MEDICATED_PATCH | CUTANEOUS | Status: DC
  Administered 2019-12-19: 1 via TRANSDERMAL
  Filled 2019-12-19: qty 1

## 2019-12-19 NOTE — ED Triage Notes (Signed)
Pt in via EMS from home with c/o back pain non traumatic. Pt has been seen at [facility] for the same and had a steroid injection 2 weeks ago. Has been using a TENS unit. 100 mcg fentanyl give IV. #20 g to right AC.

## 2019-12-19 NOTE — ED Triage Notes (Signed)
Pt states that he started having 4-5 weeks on the right side of his back, pt states that he was seen and given an injection in his SI joint, states that nothing seems to be helping, seeking an mri due to his dr's recommendations

## 2019-12-19 NOTE — ED Provider Notes (Signed)
[facility] Emergency Department Provider Note  ____________________________________________  Time seen: Approximately 10:11 AM  I have reviewed the triage vital signs and the nursing notes.   HISTORY  Chief Complaint Back Pain    HPI Michael Decker is a 86 y.o. male that presents to the emergency department for evaluation of chronic right low back pain.  Pain stays in 1 spot to his right low back.  It does not radiate.  Patient states that pain started 5 weeks ago.  He has been seeing orthopedics.  He talked orthopedics on the phone yesterday and have ordered an outpatient MRI.  Patient states that he woke up this morning to use the restroom and pain was worse.  He did not think that he could wait 7 to 10 days for his outpatient MRI so he came to the emergency department.  He has had injections to his back.  He has not tried any medications.  He has been using a TENS unit, heat, ice, CBD cream without relief.  No bowel or bladder dysfunction.  No radiculopathy.  No trauma.  No fevers, nausea, vomiting, abdominal pain.   Past Medical History:  Diagnosis Date  . Arthritis    knee  . BPH (benign prostatic hypertrophy)    on avodart, released from urologist care  . Colon polyps 2013   TA 2013 Henrene Pastor)  . Diverticulosis   . GERD (gastroesophageal reflux disease)   . Hematospermia    s/p uro eval  . HLD (hyperlipidemia)    mild  . HTN (hypertension)   . Wears hearing aid in left ear     Patient Active Problem List   Diagnosis Date Noted  . Scrotal pain 07/14/2019  . Abnormal lung sounds 07/09/2017  . Iron excess 06/28/2017  . Peripheral neuropathy 06/08/2017  . Arthritis of left knee 04/16/2017  . Health maintenance examination 07/06/2016  . Leg cramping 07/06/2016  . LBP (low back pain) 02/23/2015  . Medicare annual wellness visit, subsequent 06/23/2014  . Advanced care planning/counseling discussion 06/23/2014  . Rhinorrhea 06/23/2014  . HTN  (hypertension)   . HLD (hyperlipidemia)   . GERD (gastroesophageal reflux disease)   . Benign prostatic hyperplasia   . Colon polyps     Past Surgical History:  Procedure Laterality Date  . BUNIONECTOMY Right 2014  . CATARACT EXTRACTION W/PHACO Right 08/14/2018   Procedure: CATARACT EXTRACTION PHACO AND INTRAOCULAR LENS PLACEMENT ([place])  RIGHT;  Surgeon: Leandrew Koyanagi, MD;  Location: [place];  Service: Ophthalmology;  Laterality: Right;  requests arrival between 830 & 1030  . CATARACT EXTRACTION W/PHACO Left 09/11/2018   Procedure: CATARACT EXTRACTION PHACO AND INTRAOCULAR LENS PLACEMENT ([place])  LEFT;  Surgeon: Leandrew Koyanagi, MD;  Location: [place];  Service: Ophthalmology;  Laterality: Left;  Doesn't want to be early  . COLONOSCOPY  2008  . COLONOSCOPY  01/2012   TA x1, diverticulosis, rpt 5 yrs Henrene Pastor)  . COLONOSCOPY  02/2017   TA, diverticulosis, int hem no f/u needed Henrene Pastor)  . CYSTECTOMY    . TONSILLECTOMY  1944  . UPPER GI ENDOSCOPY      Prior to Admission medications   Medication Sig Start Date End Date Taking? Authorizing Provider  Ascorbic Acid (VITAMIN C PO) Take by mouth 2 (two) times daily.    [provider]  aspirin EC 81 MG tablet Take 1 tablet (81 mg total) by mouth every Monday, Wednesday, and Friday. 04/09/17   Ria Bush, MD  azelastine (ASTELIN)  0.1 % nasal spray Place 1 spray into both nostrils 2 (two) times daily. Use in each nostril as directed 07/14/19   Ria Bush, MD  dutasteride (AVODART) 0.5 MG capsule TAKE 1 CAPSULE BY MOUTH EVERY OTHER DAY 07/14/19   Ria Bush, MD  Glucosamine-Chondroitin (OSTEO BI-FLEX REGULAR STRENGTH PO) Take by mouth daily.     [provider]  Astrid Drafts 500 MG CAPS Take 1 capsule by mouth.    [provider]  lidocaine (LIDODERM) 5 % Place 1 patch onto the skin daily. Remove & Discard patch within 12 hours or as directed by MD 12/19/19   Laban Emperor,  PA-C  loratadine (ALLERGY RELIEF) 10 MG tablet Take 10 mg by mouth daily.    [provider]  Magnesium 400 MG CAPS Take 1 tablet by mouth every other day. Patient taking differently: Take 1 tablet by mouth daily.  07/10/18   Ria Bush, MD  meloxicam (MOBIC) 7.5 MG tablet Take 1 tablet (7.5 mg total) by mouth daily for 7 days. 12/19/19 12/26/19  Laban Emperor, PA-C  Multiple Vitamins-Minerals (MULTIVITAMIN WITH MINERALS) tablet Take 1 tablet by mouth daily.    [provider]  omeprazole (PRILOSEC) 40 MG capsule Take 1 capsule (40 mg total) by mouth daily as needed. 03/02/17   Irene Shipper, MD  Potassium Gluconate 550 MG TABS Take 1 tablet at bedtime by mouth.    [provider]  telmisartan (MICARDIS) 80 MG tablet Take 1 tablet (80 mg total) by mouth daily. 07/14/19   Ria Bush, MD  Turmeric 500 MG CAPS Take 1 capsule at bedtime by mouth.    [provider]    Allergies Patient has no known allergies.  Family History  Problem Relation Age of Onset  . Sudden death Father 67       ?MI  . Heart disease Brother 76       ?afib  . Cancer Neg Hx   . Diabetes Neg Hx   . Stroke Neg Hx   . Colon cancer Neg Hx   . Esophageal cancer Neg Hx   . Stomach cancer Neg Hx     Social History Social History   Tobacco Use  . Smoking status: Former Research scientist (life sciences)  . Smokeless tobacco: Never Used  . Tobacco comment: cigars back in the 60's  Substance Use Topics  . Alcohol use: Yes    Alcohol/week: 2.0 standard drinks    Types: 1 Glasses of wine, 1 Cans of beer per week    Comment:    . Drug use: No     Review of Systems  Constitutional: No fever/chills Cardiovascular: No chest pain. Respiratory: No cough. No SOB. Gastrointestinal: No abdominal pain.  No nausea, no vomiting.  Musculoskeletal: Positive for back pain. Skin: Negative for rash, abrasions, lacerations, ecchymosis. Neurological: Negative for  headaches   ____________________________________________   PHYSICAL EXAM:  VITAL SIGNS: ED Triage Vitals  Enc Vitals Group     BP 12/19/19 0848 (!) 143/60     Pulse Rate 12/19/19 0848 62     Resp 12/19/19 0848 18     Temp 12/19/19 0848 98.2 F (36.8 C)     Temp Source 12/19/19 0848 Oral     SpO2 12/19/19 0848 97 %     Weight 12/19/19 0847 180 lb (81.6 kg)     Height 12/19/19 0847 5\' 11"  (1.803 m)     Head Circumference --      Peak Flow --  Pain Score 12/19/19 0858 10     Pain Loc --      Pain Edu? --      Excl. in [place]? --      Constitutional: Alert and oriented. Well appearing and in no acute distress. Eyes: Conjunctivae are normal. PERRL. EOMI. Head: Atraumatic. ENT:      Ears:      Nose: No congestion/rhinnorhea.      Mouth/Throat: Mucous membranes are moist.  Neck: No stridor.   Cardiovascular: Normal rate, regular rhythm.  Good peripheral circulation. Respiratory: Normal respiratory effort without tachypnea or retractions. Lungs CTAB. Good air entry to the bases with no decreased or absent breath sounds. Gastrointestinal: Bowel sounds 4 quadrants. Soft and nontender to palpation. No guarding or rigidity. No palpable masses. No distention.  Musculoskeletal: Full range of motion to all extremities. No gross deformities appreciated.  Tenderness to palpation to right SI joint.  No tenderness to palpation over lumbar spine.  No tenderness to palpation to right trochanteric bursa.  Weightbearing without assistance. Neurologic:  Normal speech and language. No gross focal neurologic deficits are appreciated.  Skin:  Skin is warm, dry and intact. No rash noted. Psychiatric: Mood and affect are normal. Speech and behavior are normal. Patient exhibits appropriate insight and judgement.   ____________________________________________   LABS (all labs ordered are listed, but only abnormal results are displayed)  Labs Reviewed  URINALYSIS, COMPLETE (UACMP) WITH  MICROSCOPIC - Abnormal; Notable for the following components:      Result Value   Color, Urine YELLOW (*)    APPearance CLEAR (*)    All other components within normal limits   ____________________________________________  EKG   ____________________________________________  RADIOLOGY Robinette Haines, personally viewed and evaluated these images (plain radiographs) as part of my medical decision making, as well as reviewing the written report by the radiologist.  DG Lumbar Spine 2-3 Views  Result Date: 12/19/2019 CLINICAL DATA:  Right back pain, very severe last night extending into the right hip. EXAM: LUMBAR SPINE - 2-3 VIEW COMPARISON:  None. FINDINGS: Five non-rib-bearing lumbar vertebrae. Mild dextroconvex thoracolumbar scoliosis. Moderate anterior and lateral spur formation at multiple levels of the lumbar and lower thoracic spine. Facet degenerative changes throughout the lumbar spine, most pronounced at the mid and lower levels. Associated grade 1 retrolisthesis at the L3-4 level and grade 1 anterolisthesis at the L4-5 level. No fractures or pars defects. Atheromatous arterial calcifications. IMPRESSION: Multilevel degenerative changes, as described above. Electronically Signed   By: Claudie Revering M.D.   On: 12/19/2019 10:54   DG Hip Unilat W or Wo Pelvis 2-3 Views Right  Result Date: 12/19/2019 CLINICAL DATA:  Severe right lower back and hip pain. EXAM: DG HIP (WITH OR WITHOUT PELVIS) 2-3V RIGHT COMPARISON:  Abdomen and pelvis CT dated 08/15/2006. FINDINGS: Minimal right femoral head and neck junction spur formation. No fracture, dislocation or evidence of avascular necrosis. Lower lumbar spine degenerative changes. IMPRESSION: Minimal right hip degenerative changes.  No acute abnormality. Electronically Signed   By: Claudie Revering M.D.   On: 12/19/2019 10:55    ____________________________________________    PROCEDURES  Procedure(s) performed:    Procedures    Medications   lidocaine (LIDODERM) 5 % 1 patch (1 patch Transdermal Patch Applied 12/19/19 1024)  diazepam (VALIUM) tablet 2 mg (2 mg Oral Given 12/19/19 1024)     ____________________________________________   INITIAL IMPRESSION / ASSESSMENT AND PLAN / ED COURSE  Pertinent labs & imaging results that were available during  my care of the patient were reviewed by me and considered in my medical decision making (see chart for details).  Review of the [place] CSRS was performed in accordance of the [place] prior to dispensing any controlled drugs.   Patient presented to emergency department for evaluation of chronic right-sided low back pain.  Vital signs and exam are reassuring.  Exam is consistent with sacroiliitis.  Lumbar x-ray and hip x-ray are consistent with degenerative changes.  Patient has an outpatient MRI ordered through [facility] clinic with orthopedics, who he has been following.  Patient received fentanyl with EMS prior to arriving to the emergency department.  Patient was given a dose of Valium and a Lidoderm patch in the emergency department for muscle relaxation and states that symptoms have improved.  He is up walking around the room and using the restroom without any assistance.  He was given a walker for extra support.  Patient will be discharged home with prescriptions for Mobic and Lidoderm. Patient is to follow up with orthopedics as directed. Patient is given ED precautions to return to the ED for any worsening or new symptoms.  ELZY GROSSNICKLE was evaluated in Emergency Department on 12/19/2019 for the symptoms described in the history of present illness. He was evaluated in the context of the global COVID-19 pandemic, which necessitated consideration that the patient might be at risk for infection with the SARS-CoV-2 virus that causes COVID-19. Institutional protocols and algorithms that pertain to the evaluation of patients at risk for COVID-19 are in a state of rapid change based on information  released by regulatory bodies including the CDC and federal and state organizations. These policies and algorithms were followed during the patient's care in the ED.   ____________________________________________  FINAL CLINICAL IMPRESSION(S) / ED DIAGNOSES  Final diagnoses:  SI (sacroiliac) pain      NEW MEDICATIONS STARTED DURING THIS VISIT:  ED Discharge Orders         Ordered    lidocaine (LIDODERM) 5 %  Every 24 hours     12/19/19 1152    meloxicam (MOBIC) 7.5 MG tablet  Daily     12/19/19 1152              This chart was dictated using voice recognition software/Dragon. Despite best efforts to proofread, errors can occur which can change the meaning. Any change was purely unintentional.    Laban Emperor, PA-C 12/19/19 1509    Nena Polio, MD 12/19/19 1525

## 2019-12-19 NOTE — ED Notes (Signed)
See triage note  Presents with worsening right lower back pain  Has been seen at [facility]  Was given an injection about 2 weeks ago  States he tried to get up to go to bathroom this am   Pain became more severe  Unable to bear wt

## 2019-12-24 ENCOUNTER — Other Ambulatory Visit: Payer: Self-pay | Admitting: Student

## 2019-12-24 ENCOUNTER — Ambulatory Visit
Admission: RE | Admit: 2019-12-24 | Discharge: 2019-12-24 | Disposition: A | Discharge status: Home / Self Care | Payer: PPO | Source: Ambulatory Visit | Attending: Student | Admitting: Student

## 2019-12-24 ENCOUNTER — Other Ambulatory Visit: Payer: Self-pay

## 2019-12-24 DIAGNOSIS — M545 Low back pain, unspecified: Secondary | ICD-10-CM

## 2019-12-24 DIAGNOSIS — G8929 Other chronic pain: Secondary | ICD-10-CM

## 2019-12-31 DIAGNOSIS — M4310 Spondylolisthesis, site unspecified: Secondary | ICD-10-CM | POA: Diagnosis not present

## 2019-12-31 DIAGNOSIS — M5136 Other intervertebral disc degeneration, lumbar region: Secondary | ICD-10-CM | POA: Diagnosis not present

## 2020-01-14 DIAGNOSIS — M47816 Spondylosis without myelopathy or radiculopathy, lumbar region: Secondary | ICD-10-CM | POA: Diagnosis not present

## 2020-01-19 ENCOUNTER — Other Ambulatory Visit: Payer: Self-pay | Admitting: Neurosurgery

## 2020-01-19 DIAGNOSIS — M5136 Other intervertebral disc degeneration, lumbar region: Secondary | ICD-10-CM

## 2020-02-10 DIAGNOSIS — M4726 Other spondylosis with radiculopathy, lumbar region: Secondary | ICD-10-CM | POA: Diagnosis not present

## 2020-02-10 DIAGNOSIS — M5136 Other intervertebral disc degeneration, lumbar region: Secondary | ICD-10-CM | POA: Diagnosis not present

## 2020-02-10 DIAGNOSIS — M461 Sacroiliitis, not elsewhere classified: Secondary | ICD-10-CM | POA: Diagnosis not present

## 2020-02-10 DIAGNOSIS — M5416 Radiculopathy, lumbar region: Secondary | ICD-10-CM | POA: Diagnosis not present

## 2020-02-23 ENCOUNTER — Telehealth: Payer: Self-pay | Admitting: Family Medicine

## 2020-02-23 NOTE — Progress Notes (Signed)
  Chronic Care Management   Note  02/23/2020 Name: Michael Decker MRN: [id number] DOB: 1934/02/25  Michael Decker is a 86 y.o. year old male who is a primary care patient of Ria Bush, MD. I reached out to [name] by phone today in response to a referral sent by Michael Decker PCP, Ria Bush, MD.   Michael Decker was given information about Chronic Care Management services today including:  1. CCM service includes personalized support from designated clinical staff supervised by his physician, including individualized plan of care and coordination with other care providers 2. 24/7 contact phone numbers for assistance for urgent and routine care needs. 3. Service will only be billed when office clinical staff spend 20 minutes or more in a month to coordinate care. 4. Only one practitioner may furnish and bill the service in a calendar month. 5. The patient may stop CCM services at any time (effective at the end of the month) by phone call to the office staff.   Patient agreed to services and verbal consent obtained.   Follow up plan:   Earney [place] [facility] Scheduler

## 2020-03-03 DIAGNOSIS — M1712 Unilateral primary osteoarthritis, left knee: Secondary | ICD-10-CM | POA: Diagnosis not present

## 2020-03-10 DIAGNOSIS — M1712 Unilateral primary osteoarthritis, left knee: Secondary | ICD-10-CM | POA: Diagnosis not present

## 2020-03-17 DIAGNOSIS — M1712 Unilateral primary osteoarthritis, left knee: Secondary | ICD-10-CM | POA: Diagnosis not present

## 2020-04-09 NOTE — Chronic Care Management (AMB) (Signed)
Chronic Care Management Pharmacy  Name: Michael Decker  MRN: [id number] DOB: 04-Feb-1934  Chief Complaint/ HPI  Michael Decker,  86 y.o. , male presents for their Initial CCM visit with the clinical pharmacist via telephone due to COVID-19 Pandemic.  PCP : Ria Bush, MD  Their chronic conditions include: hypertension, GERD, neuropathy, arthritis, BPH, hyperlipidemia, rhinorrhea.   Office Visits: 07/14/2019 - trial of flonase vs. Astelin.  Consult Visit: 03/17/20: [name] - orthovisc injection.  03/10/20: [name] - orthovisc injection.  03/03/20: Ortho - orthovisc injection 03/02/20: Physical medicine - meloxicam increased to bid for 1 week due to increased pain.  02/18/20: Physical medicine. Continue Meloxicam 7.5 mg daily.  02/10/20: Phsyical medicine - avoid Aleve and try meloxicam 7.5 mg daily with food. Schedule medial branch blocks.  12/19/19: ED visit for sacroiliac pain. Prescribed lidoderm patch and meloxicam 7.5 mg daily.  12/08/19: Ortho - sacroiliac pain. Continue to work on home exercises and gentle stretching. SI joint injection using ultrasound guidance.   Medications: Outpatient Encounter Medications as of 04/12/2020  Medication Sig  . aspirin EC 81 MG tablet Take 1 tablet (81 mg total) by mouth every Monday, Wednesday, and Friday.  . calcium carbonate (TUMS - DOSED IN MG ELEMENTAL CALCIUM) 500 MG chewable tablet Chew 1 tablet by mouth as needed for indigestion or heartburn.  . dutasteride (AVODART) 0.5 MG capsule TAKE 1 CAPSULE BY MOUTH EVERY OTHER DAY  . Glucosamine-Chondroitin (OSTEO BI-FLEX REGULAR STRENGTH PO) Take by mouth daily.   Michael Decker 500 MG CAPS Take 1 capsule by mouth daily.   . Magnesium 400 MG CAPS Take 1 tablet by mouth every other day. (Patient taking differently: Take 1 tablet by mouth daily. )  . Multiple Vitamins-Minerals (MULTIVITAMIN WITH MINERALS) tablet Take 1 tablet by mouth daily.  . Potassium Gluconate 550 MG TABS Take 1 tablet at bedtime by  mouth.  . telmisartan (MICARDIS) 80 MG tablet Take 1 tablet (80 mg total) by mouth daily.  . Turmeric 500 MG CAPS Take 1 capsule at bedtime by mouth.  . Ascorbic Acid (VITAMIN C PO) Take by mouth 2 (two) times daily. (Patient not taking: Reported on 04/12/2020)  . azelastine (ASTELIN) 0.1 % nasal spray Place 1 spray into both nostrils 2 (two) times daily. Use in each nostril as directed (Patient not taking: Reported on 04/12/2020)  . lidocaine (LIDODERM) 5 % Place 1 patch onto the skin daily. Remove & Discard patch within 12 hours or as directed by MD (Patient not taking: Reported on 04/12/2020)  . loratadine (ALLERGY RELIEF) 10 MG tablet Take 10 mg by mouth daily. (Patient not taking: Reported on 04/12/2020)  . omeprazole (PRILOSEC) 40 MG capsule Take 1 capsule (40 mg total) by mouth daily as needed. (Patient not taking: Reported on 04/12/2020)   No facility-administered encounter medications on file as of 04/12/2020.   No Known Allergies  SDOH Screenings   Alcohol Screen:   . Last Alcohol Screening Score (AUDIT): Not on file  Depression (PHQ2-9): Low Risk   . PHQ-2 Score: 0  Financial Resource Strain:   . Difficulty of Paying Living Expenses: Not on file  Food Insecurity: No Food Insecurity  . Worried About Charity fundraiser in the Last Year: Never true  . Ran Out of Food in the Last Year: Never true  Housing: Low Risk   . Last Housing Risk Score: 0  Physical Activity:   . Days of Exercise per Week: Not on file  . Minutes  of Exercise per Session: Not on file  Social Connections:   . Frequency of Communication with Friends and Family: Not on file  . Frequency of Social Gatherings with Friends and Family: Not on file  . Attends Religious Services: Not on file  . Active Member of Clubs or Organizations: Not on file  . Attends Archivist Meetings: Not on file  . Marital Status: Not on file  Stress:   . Feeling of Stress : Not on file  Tobacco Use: Medium Risk  . Smoking  Tobacco Use: Former Smoker  . Smokeless Tobacco Use: Never Used  Transportation Needs:   . Film/video editor (Medical): Not on file  . Lack of Transportation (Non-Medical): Not on file     Current Diagnosis/Assessment:  Goals Addressed            This Visit's Progress   . Pharmacy Care Plan       CARE PLAN ENTRY (see longitudinal plan of care for additional care plan information)  Current Barriers:  . Chronic Disease Management support, education, and care coordination needs related to Hypertension and Hyperlipidemia   Hypertension BP Readings from Last 3 Encounters:  12/19/19 (!) 143/60  07/14/19 138/68  09/11/18 (!) 127/52   . Pharmacist Clinical Goal(s): o Over the next 90 days, patient will work with PharmD and providers to maintain BP goal <140/90 . Current regimen:  o Telmisartan 80 mg daily  . Interventions: o Reviewed medication list.  o Reviewed home blood pressure readings.  . Patient self care activities - Over the next 90 days, patient will: o Check BP monthly, document, and provide at future appointments o Ensure daily salt intake < 2300 mg/day  Hyperlipidemia Lab Results  Component Value Date/Time   LDLCALC 94 07/07/2019 09:09 AM   LDLCALC 82 06/17/2013 12:00 AM   . Pharmacist Clinical Goal(s): o Over the next 90 days, patient will work with PharmD and providers to maintain LDL goal < 100 . Current regimen:  o Diet and Lifestyle o Aspirin ec 81 mg every Monday, Wednesday and Friday . Interventions: o Reviewed diet and exercise habits.  . Patient self care activities - Over the next 90 days, patient will: o Keep up the good work with diet and lifestyle management of cholesterol.   Medication management . Pharmacist Clinical Goal(s): o Over the next 90 days, patient will work with PharmD and providers to maintain optimal medication adherence . [facility] . Interventions o Comprehensive medication review performed. o Continue  current medication management strategy . Patient self care activities - Over the next 90 days, patient will: o Focus on medication adherence by using pill box.  o Take medications as prescribed o Report any questions or concerns to PharmD and/or provider(s)  Initial goal documentation        Hypertension   BP today is:  <140/90  Office blood pressures are  BP Readings from Last 3 Encounters:  12/19/19 (!) 143/60  07/14/19 138/68  09/11/18 (!) 127/52    Patient has failed these meds in the past: diltiazem, metoprolol succinate Patient is currently controlled on the following medications:   Telmisartan 80 mg daily  Patient checks BP at home several times per month  Patient home BP readings are ranging: 120/71, 141/72   We discussed diet and exercise extensively. Patient exercises 3 times each week. He rides the bike at the gym and completes activities with weights. Occasionally has some dizziness with standing or position change. Has not  fallen but does get slightly dizzy. Patient stands still for it to subside.    Plan  Continue current medications   Hyperlipidemia   LDL goal < 100  Lipid Panel     Component Value Date/Time   CHOL 174 07/07/2019 0909   TRIG 124.0 07/07/2019 0909   TRIG 205 06/17/2013 0000   HDL 55.10 07/07/2019 0909   LDLCALC 94 07/07/2019 0909   LDLCALC 82 06/17/2013 0000    Hepatic Function Latest Ref Rng & Units 07/07/2019 07/02/2018 04/20/2017  Total Protein 6.0 - 8.3 g/dL 6.0 6.0 6.1  Albumin 3.5 - 5.2 g/dL 3.7 3.8 3.7  AST 0 - 37 U/L _0 ALT 0 - 53 U/L _1 Alk Phosphatase 39 - 117 U/L 63 61 59  Total Bilirubin 0.2 - 1.2 mg/dL 0.8 0.7 0.7     The ASCVD Risk score (Bynum., et al., 2013) failed to calculate for the following reasons:   The 2013 ASCVD risk score is only valid for ages 54 to 49   Patient has failed these meds in past: none reported Patient is currently controlled on the following medications:  . Aspirin  ec 81 mg every Monday, Wednesday and Friday  We discussed:  diet and exercise extensively   Exercise: Patient visits his community gym 3 times weekly for riding the bike and weight activities.    Diet:  Patient reports smaller portions. Tries to avoid salt and sugar. Eats a variety of foods in moderation.   Plan  Continue current medications   GERD   Patient has failed these meds in past: omeprazole Patient is currently controlled on the following medications:  . TUMS prn acid reflux  We discussed:  Patient can minimize effects by eating smaller meals and avoiding eating too late at night.   Plan  Continue current medications   Health Maintenance   Patient is currently controlled on the following medications:  . Dutasteride 0.5 mg every other day -bph per urology . Glucosamine-chondroitin daily - knee supplement . Krill Decker 500 mg supplement - supplementation . Turmeric 500 mg at bedtime - supplementation . Magnesium 400 mg daily - supplementation . Multiple vitamin daily - supplementation . Potassium gluconate 550 mg - supplementation  We discussed:  Patient reports no issues with medications or supplements.   Plan  Continue current medications  Vaccines   Reviewed and discussed patient's vaccination history. Patient has completed first two doses of Moderna COVID Vaccine February/March 21. Waiting to be eligible for third dose or booster.   Immunization History  Administered Date(s) Administered  . Fluad Quad(high Dose 65+) 04/08/2019  . Influenza Inj Mdck Quad Pf 05/02/2018  . Influenza, High Dose Seasonal PF 05/04/2017  . Influenza,inj,Quad PF,6+ Mos 04/29/2014, 05/14/2015, 05/02/2016  . Influenza-Unspecified 05/09/2013  . Pneumococcal Conjugate-13 06/23/2014  . Pneumococcal Polysaccharide-23 03/24/2001, 06/28/2016  . Tdap 05/11/2009  . Zoster 06/12/2012    Plan  Recommended patient receive annual flu vaccine in office.   Medication Management   [facility] Uses pill box? Yes - am and pm  Pt endorses Excellent compliance  - <5 day gap in refill history  We discussed: Discussed benefits of medication synchronization, packaging and delivery as well as enhanced pharmacist oversight with [facility].  Plan  Continue current medication management strategy    Follow up: 12 month phone visit

## 2020-04-12 ENCOUNTER — Ambulatory Visit: Payer: PPO

## 2020-04-12 ENCOUNTER — Other Ambulatory Visit: Payer: Self-pay

## 2020-04-12 DIAGNOSIS — I10 Essential (primary) hypertension: Secondary | ICD-10-CM

## 2020-04-12 DIAGNOSIS — E785 Hyperlipidemia, unspecified: Secondary | ICD-10-CM

## 2020-04-12 NOTE — Patient Instructions (Addendum)
Visit Information  Thank you for your time discussing your medications. I look forward to working with you to achieve your health care goals. Below is a summary of what we talked about during our visit.   Goals Addressed            This Visit's Progress   . Pharmacy Care Plan       CARE PLAN ENTRY (see longitudinal plan of care for additional care plan information)  Current Barriers:  . Chronic Disease Management support, education, and care coordination needs related to Hypertension and Hyperlipidemia   Hypertension BP Readings from Last 3 Encounters:  12/19/19 (!) 143/60  07/14/19 138/68  09/11/18 (!) 127/52   . Pharmacist Clinical Goal(s): o Over the next 90 days, patient will work with PharmD and providers to maintain BP goal <140/90 . Current regimen:  o Telmisartan 80 mg daily  . Interventions: o Reviewed medication list.  o Reviewed home blood pressure readings.  . Patient self care activities - Over the next 90 days, patient will: o Check BP monthly, document, and provide at future appointments o Ensure daily salt intake < 2300 mg/day  Hyperlipidemia Lab Results  Component Value Date/Time   LDLCALC 94 07/07/2019 09:09 AM   LDLCALC 82 06/17/2013 12:00 AM   . Pharmacist Clinical Goal(s): o Over the next 90 days, patient will work with PharmD and providers to maintain LDL goal < 100 . Current regimen:  o Diet and Lifestyle o Aspirin ec 81 mg every Monday, Wednesday and Friday . Interventions: o Reviewed diet and exercise habits.  . Patient self care activities - Over the next 90 days, patient will: o Keep up the good work with diet and lifestyle management of cholesterol.   Medication management . Pharmacist Clinical Goal(s): o Over the next 90 days, patient will work with PharmD and providers to maintain optimal medication adherence . Current pharmacy: [facility] . Interventions o Comprehensive medication review performed. o Continue current medication  management strategy . Patient self care activities - Over the next 90 days, patient will: o Focus on medication adherence by using pill box.  o Take medications as prescribed o Report any questions or concerns to PharmD and/or provider(s)  Initial goal documentation        Mr. Standage was given information about Chronic Care Management services today including:  1. CCM service includes personalized support from designated clinical staff supervised by his physician, including individualized plan of care and coordination with other care providers 2. 24/7 contact phone numbers for assistance for urgent and routine care needs. 3. Standard insurance, coinsurance, copays and deductibles apply for chronic care management only during months in which we provide at least 20 minutes of these services. Most insurances cover these services at 100%, however patients may be responsible for any copay, coinsurance and/or deductible if applicable. This service may help you avoid the need for more expensive face-to-face services. 4. Only one practitioner may furnish and bill the service in a calendar month. 5. The patient may stop CCM services at any time (effective at the end of the month) by phone call to the office staff.  Patient agreed to services and verbal consent obtained.   The patient verbalized understanding of instructions provided today and declined a print copy of patient instruction materials.  Telephone follow up appointment with pharmacy team member scheduled for: 03/2021  Sherre Poot, PharmD Clinical Pharmacist [facility] [phone] (office) [phone] (mobile)  Hypotension As your heart beats, it forces  blood through your body. This force is called blood pressure. If you have hypotension, you have low blood pressure. When your blood pressure is too low, you may not get enough blood to your brain or other parts of your body. This may cause you to feel weak, light-headed,  have a fast heartbeat, or even pass out (faint). Low blood pressure may be harmless, or it may cause serious problems. What are the causes?  Blood loss.  Not enough water in the body (dehydration).  Heart problems.  Hormone problems.  Pregnancy.  A very bad infection.  Not having enough of certain nutrients.  Very bad allergic reactions.  Certain medicines. What increases the risk?  Age. The risk increases as you get older.  Conditions that affect the heart or the brain and spinal cord (central nervous system).  Taking certain medicines.  Being pregnant. What are the signs or symptoms?  Feeling: ? Weak. ? Light-headed. ? Dizzy. ? Tired (fatigued).  Blurred vision.  Fast heartbeat.  Passing out, in very bad cases. How is this treated?  Changing your diet. This may involve eating more salt (sodium) or drinking more water.  Taking medicines to raise your blood pressure.  Changing how much you take (the dosage) of some of your medicines.  Wearing compression stockings. These stockings help to prevent blood clots and reduce swelling in your legs. In some cases, you may need to go to the hospital for:  Fluid replacement. This means you will receive fluids through an IV tube.  Blood replacement. This means you will receive donated blood through an IV tube (transfusion).  Treating an infection or heart problems, if this applies.  Monitoring. You may need to be monitored while medicines that you are taking wear off. Follow these instructions at home: Eating and drinking   Drink enough fluids to keep your pee (urine) pale yellow.  Eat a healthy diet. Follow instructions from your doctor about what you can eat or drink. A healthy diet includes: ? Fresh fruits and vegetables. ? Whole grains. ? Low-fat (lean) meats. ? Low-fat dairy products.  Eat extra salt only as told. Do not add extra salt to your diet unless your doctor tells you to.  Eat small meals  often.  Avoid standing up quickly after you eat. Medicines  Take over-the-counter and prescription medicines only as told by your doctor. ? Follow instructions from your doctor about changing how much you take of your medicines, if this applies. ? Do not stop or change any of your medicines on your own. General instructions   Wear compression stockings as told by your doctor.  Get up slowly from lying down or sitting.  Avoid hot showers and a lot of heat as told by your doctor.  Return to your normal activities as told by your doctor. Ask what activities are safe for you.  Do not use any products that contain nicotine or tobacco, such as cigarettes, e-cigarettes, and chewing tobacco. If you need help quitting, ask your doctor.  Keep all follow-up visits as told by your doctor. This is important. Contact a doctor if:  You throw up (vomit).  You have watery poop (diarrhea).  You have a fever for more than 2-3 days.  You feel more thirsty than normal.  You feel weak and tired. Get help right away if:  You have chest pain.  You have a fast or uneven heartbeat.  You lose feeling (have numbness) in any part of your body.  You  cannot move your arms or your legs.  You have trouble talking.  You get sweaty or feel light-headed.  You pass out.  You have trouble breathing.  You have trouble staying awake.  You feel mixed up (confused). Summary  Hypotension is also called low blood pressure. It is when the force of blood pumping through your arteries is too weak.  Hypotension may be harmless, or it may cause serious problems.  Treatment may include changing your diet and medicines, and wearing compression stockings.  In very bad cases, you may need to go to the hospital. This information is not intended to replace advice given to you by your health care provider. Make sure you discuss any questions you have with your health care provider. Document Revised:  01/03/2018 Document Reviewed: 01/03/2018 Elsevier Patient Education  [place].

## 2020-04-13 DIAGNOSIS — L111 Transient acantholytic dermatosis [Grover]: Secondary | ICD-10-CM | POA: Diagnosis not present

## 2020-04-13 DIAGNOSIS — L570 Actinic keratosis: Secondary | ICD-10-CM | POA: Diagnosis not present

## 2020-04-13 DIAGNOSIS — D225 Melanocytic nevi of trunk: Secondary | ICD-10-CM | POA: Diagnosis not present

## 2020-04-13 DIAGNOSIS — L821 Other seborrheic keratosis: Secondary | ICD-10-CM | POA: Diagnosis not present

## 2020-04-13 NOTE — Progress Notes (Signed)
I have collaborated with the care management provider regarding care management and care coordination activities outlined in this encounter and have reviewed this encounter including documentation in the note and care plan. I am certifying that I agree with the content of this note and encounter as supervising physician.  

## 2020-04-15 DIAGNOSIS — H26491 Other secondary cataract, right eye: Secondary | ICD-10-CM | POA: Diagnosis not present

## 2020-04-19 DIAGNOSIS — H26491 Other secondary cataract, right eye: Secondary | ICD-10-CM | POA: Diagnosis not present

## 2020-04-21 DIAGNOSIS — M75112 Incomplete rotator cuff tear or rupture of left shoulder, not specified as traumatic: Secondary | ICD-10-CM | POA: Diagnosis not present

## 2020-04-21 DIAGNOSIS — M7522 Bicipital tendinitis, left shoulder: Secondary | ICD-10-CM | POA: Diagnosis not present

## 2020-05-07 DIAGNOSIS — H26492 Other secondary cataract, left eye: Secondary | ICD-10-CM | POA: Diagnosis not present

## 2020-05-11 ENCOUNTER — Telehealth: Payer: Self-pay

## 2020-05-11 NOTE — Telephone Encounter (Signed)
Agree with slow transitions.  If ongoing dizziness / recurrent fall, would recommend sooner appt with next available provider, not wait until 05/25/2020.

## 2020-05-11 NOTE — Telephone Encounter (Signed)
Denisha at front desk gave me a note to ck on pt appt scheduled thru my chart on 05/25/20 for dizziness and spells with orientation. Pt fell on Sat due to dizziness with room spinning around; Mrs Hatchel said no injury. Pt is presently sleeping. Pt's orientation issues are with pts head spinning it is difficult for pt to tell which way he is going. Pt has not fallen and hit his head. Pt knows who people are and where he is. BP on 05/07/20 7pm BP 148/85 P 83 and at 10 PM 124/66 P74. Pt only has dizziness when he gets up from seated position on and off for couple of months. Today no dizziness but few days ago had dizziness on and off when getting up 4 days in a row. Pt is not on any new medication. Pt had flu shot last week and had bright red rash all over body and pt took benadryl and cortisone 10. Pt's rash lasted several days. Pt had never had reaction to flu shot before. pts wife said no covid symptoms and no known exposure to covid. Pt cannot come in 05/12/20; offered appt with different provider but for now will keep appt on 05/25/20 with Dr Darnell Level and if Dr Darnell Level has opinion of something for pt to do or a different appt please call Mr or Mrs Gartin. Advised that when pt stands up from seated position to get up slowly. Mrs Mcfadyen voiced understanding. UC & ED precautions given and pt's wife voiced understanding.

## 2020-05-12 NOTE — Telephone Encounter (Signed)
Spoke with pt relaying Dr. G's message. Pt verbalizes understanding.  

## 2020-05-25 ENCOUNTER — Other Ambulatory Visit: Payer: Self-pay

## 2020-05-25 ENCOUNTER — Encounter: Payer: Self-pay | Admitting: Family Medicine

## 2020-05-25 ENCOUNTER — Ambulatory Visit (INDEPENDENT_AMBULATORY_CARE_PROVIDER_SITE_OTHER): Payer: PPO | Admitting: Family Medicine

## 2020-05-25 VITALS — BP 140/62 | HR 62 | Temp 97.6°F | Ht 71.0 in | Wt 179.4 lb

## 2020-05-25 DIAGNOSIS — I4891 Unspecified atrial fibrillation: Secondary | ICD-10-CM | POA: Diagnosis not present

## 2020-05-25 DIAGNOSIS — G6289 Other specified polyneuropathies: Secondary | ICD-10-CM | POA: Diagnosis not present

## 2020-05-25 DIAGNOSIS — R3589 Other polyuria: Secondary | ICD-10-CM

## 2020-05-25 DIAGNOSIS — R42 Dizziness and giddiness: Secondary | ICD-10-CM | POA: Insufficient documentation

## 2020-05-25 DIAGNOSIS — I4819 Other persistent atrial fibrillation: Secondary | ICD-10-CM | POA: Insufficient documentation

## 2020-05-25 DIAGNOSIS — E785 Hyperlipidemia, unspecified: Secondary | ICD-10-CM

## 2020-05-25 DIAGNOSIS — I10 Essential (primary) hypertension: Secondary | ICD-10-CM | POA: Diagnosis not present

## 2020-05-25 LAB — POC URINALSYSI DIPSTICK (AUTOMATED)
Bilirubin, UA: NEGATIVE
Blood, UA: NEGATIVE
Glucose, UA: NEGATIVE
Ketones, UA: NEGATIVE
Leukocytes, UA: NEGATIVE
Nitrite, UA: NEGATIVE
Protein, UA: NEGATIVE
Spec Grav, UA: 1.015 (ref 1.010–1.025)
Urobilinogen, UA: 0.2 E.U./dL
pH, UA: 6 (ref 5.0–8.0)

## 2020-05-25 LAB — CBC WITH DIFFERENTIAL/PLATELET
Basophils Absolute: 0 10*3/uL (ref 0.0–0.1)
Basophils Relative: 0.7 % (ref 0.0–3.0)
Eosinophils Absolute: 0.1 10*3/uL (ref 0.0–0.7)
Eosinophils Relative: 1 % (ref 0.0–5.0)
HCT: 44.5 % (ref 39.0–52.0)
Hemoglobin: 15.2 g/dL (ref 13.0–17.0)
Lymphocytes Relative: 20.6 % (ref 12.0–46.0)
Lymphs Abs: 1.4 10*3/uL (ref 0.7–4.0)
MCHC: 34.1 g/dL (ref 30.0–36.0)
MCV: 101 fl — ABNORMAL HIGH (ref 78.0–100.0)
Monocytes Absolute: 0.7 10*3/uL (ref 0.1–1.0)
Monocytes Relative: 10 % (ref 3.0–12.0)
Neutro Abs: 4.5 10*3/uL (ref 1.4–7.7)
Neutrophils Relative %: 67.7 % (ref 43.0–77.0)
Platelets: 200 10*3/uL (ref 150.0–400.0)
RBC: 4.41 Mil/uL (ref 4.22–5.81)
RDW: 13.1 % (ref 11.5–15.5)
WBC: 6.6 10*3/uL (ref 4.0–10.5)

## 2020-05-25 LAB — COMPREHENSIVE METABOLIC PANEL
ALT: 19 U/L (ref 0–53)
AST: 24 U/L (ref 0–37)
Albumin: 3.8 g/dL (ref 3.5–5.2)
Alkaline Phosphatase: 62 U/L (ref 39–117)
BUN: 23 mg/dL (ref 6–23)
CO2: 30 mEq/L (ref 19–32)
Calcium: 8.8 mg/dL (ref 8.4–10.5)
Chloride: 99 mEq/L (ref 96–112)
Creatinine, Ser: 1.2 mg/dL (ref 0.40–1.50)
GFR: 55.68 mL/min — ABNORMAL LOW (ref >60.00)
Glucose, Bld: 83 mg/dL (ref 70–99)
Potassium: 4.3 mEq/L (ref 3.5–5.1)
Sodium: 135 mEq/L (ref 135–145)
Total Bilirubin: 0.9 mg/dL (ref 0.2–1.2)
Total Protein: 6.4 g/dL (ref 6.0–8.3)

## 2020-05-25 LAB — VITAMIN B12: Vitamin B-12: 469 pg/mL (ref 211–911)

## 2020-05-25 LAB — LIPID PANEL
Cholesterol: 159 mg/dL (ref 0–200)
HDL: 65.9 mg/dL (ref >39.00)
LDL Cholesterol: 76 mg/dL (ref 0–99)
NonHDL: 93.55
Total CHOL/HDL Ratio: 2
Triglycerides: 90 mg/dL (ref 0.0–149.0)
VLDL: 18 mg/dL (ref 0.0–40.0)

## 2020-05-25 LAB — TSH: TSH: 1.05 u[IU]/mL (ref 0.35–4.50)

## 2020-05-25 MED ORDER — ASPIRIN EC 81 MG PO TBEC: 81.0000 mg | DELAYED_RELEASE_TABLET | Freq: Every day | ORAL | Status: DC

## 2020-05-25 NOTE — Assessment & Plan Note (Signed)
Chronic, stable on current regimen.  

## 2020-05-25 NOTE — Patient Instructions (Addendum)
EKG and urine test today Make sure you stay well hydrated.  We will refer you to cardiology in [place] ([place]).  Increase aspirin to daily until you see the heart doctor.  Labs today. Atrial Fibrillation  Atrial fibrillation is a type of irregular or rapid heartbeat (arrhythmia). In atrial fibrillation, the top part of the heart (atria) beats in an irregular pattern. This makes the heart unable to pump blood normally and effectively. The goal of treatment is to prevent blood clots from forming, control your heart rate, or restore your heartbeat to a normal rhythm. If this condition is not treated, it can cause serious problems, such as a weakened heart muscle (cardiomyopathy) or a stroke. What are the causes? This condition is often caused by medical conditions that damage the heart's electrical system. These include:  High blood pressure (hypertension). This is the most common cause.  Certain heart problems or conditions, such as heart failure, coronary artery disease, heart valve problems, or heart surgery.  Diabetes.  Overactive thyroid (hyperthyroidism).  Obesity.  Chronic kidney disease. In some cases, the cause of this condition is not known. What increases the risk? This condition is more likely to develop in:  Older people.  People who smoke.  Athletes who do endurance exercise.  People who have a family history of atrial fibrillation.  Men.  People who use drugs.  People who drink a lot of alcohol.  People who have lung conditions, such as emphysema, pneumonia, or COPD.  People who have obstructive sleep apnea. What are the signs or symptoms? Symptoms of this condition include:  A feeling that your heart is racing or beating irregularly.  Discomfort or pain in your chest.  Shortness of breath.  Sudden light-headedness or weakness.  Tiring easily during exercise or activity.  Fatigue.  Syncope (fainting).  Sweating. In some cases, there are no  symptoms. How is this diagnosed? Your health care provider may detect atrial fibrillation when taking your pulse. If detected, this condition may be diagnosed with:  An electrocardiogram (ECG) to check electrical signals of the heart.  An ambulatory cardiac monitor to record your heart's activity for a few days.  A transthoracic echocardiogram (TTE) to create pictures of your heart.  A transesophageal echocardiogram (TEE) to create even closer pictures of your heart.  A stress test to check your blood supply while you exercise.  Imaging tests, such as a CT scan or chest X-ray.  Blood tests. How is this treated? Treatment depends on underlying conditions and how you feel when you experience atrial fibrillation. This condition may be treated with:  Medicines to prevent blood clots or to treat heart rate or heart rhythm problems.  Electrical cardioversion to reset the heart's rhythm.  A pacemaker to correct abnormal heart rhythm.  Ablation to remove the heart tissue that sends abnormal signals.  Left atrial appendage closure to seal the area where blood clots can form. In some cases, underlying conditions will be treated. Follow these instructions at home: Medicines  Take over-the counter and prescription medicines only as told by your health care provider.  Do not take any new medicines without talking to your health care provider.  If you are taking blood thinners: ? Talk with your health care provider before you take any medicines that contain aspirin or NSAIDs, such as ibuprofen. These medicines increase your risk for dangerous bleeding. ? Take your medicine exactly as told, at the same time every day. ? Avoid activities that could cause injury or bruising,  and follow instructions about how to prevent falls. ? Wear a medical alert bracelet or carry a card that lists what medicines you take. Lifestyle      Do not use any products that contain nicotine or tobacco, such  as cigarettes, e-cigarettes, and chewing tobacco. If you need help quitting, ask your health care provider.  Eat heart-healthy foods. Talk with a dietitian to make an eating plan that is right for you.  Exercise regularly as told by your health care provider.  Do not drink alcohol.  Lose weight if you are overweight.  Do not use drugs, including cannabis. General instructions  If you have obstructive sleep apnea, manage your condition as told by your health care provider.  Do not use diet pills unless your health care provider approves. Diet pills can make heart problems worse.  Keep all follow-up visits as told by your health care provider. This is important. Contact a health care provider if you:  Notice a change in the rate, rhythm, or strength of your heartbeat.  Are taking a blood thinner and you notice more bruising.  Tire more easily when you exercise or do heavy work.  Have a sudden change in weight. Get help right away if you have:   Chest pain, abdominal pain, sweating, or weakness.  Trouble breathing.  Side effects of blood thinners, such as blood in your vomit, stool, or urine, or bleeding that cannot stop.  Any symptoms of a stroke. "BE FAST" is an easy way to remember the main warning signs of a stroke: ? B - Balance. Signs are dizziness, sudden trouble walking, or loss of balance. ? E - Eyes. Signs are trouble seeing or a sudden change in vision. ? F - Face. Signs are sudden weakness or numbness of the face, or the face or eyelid drooping on one side. ? A - Arms. Signs are weakness or numbness in an arm. This happens suddenly and usually on one side of the body. ? S - Speech. Signs are sudden trouble speaking, slurred speech, or trouble understanding what people say. ? T - Time. Time to call emergency services. Write down what time symptoms started.  Other signs of a stroke, such as: ? A sudden, severe headache with no known cause. ? Nausea or  vomiting. ? Seizure. These symptoms may represent a serious problem that is an emergency. Do not wait to see if the symptoms will go away. Get medical help right away. Call your local emergency services (911 in the [place] Do not drive yourself to the hospital. Summary  Atrial fibrillation is a type of irregular or rapid heartbeat (arrhythmia).  Symptoms include a feeling that your heart is beating fast or irregularly.  You may be given medicines to prevent blood clots or to treat heart rate or heart rhythm problems.  Get help right away if you have signs or symptoms of a stroke.  Get help right away if you cannot catch your breath or have chest pain or pressure. This information is not intended to replace advice given to you by your health care provider. Make sure you discuss any questions you have with your health care provider. Document Revised: 01/01/2019 Document Reviewed: 01/01/2019 Elsevier Patient Education  [place].

## 2020-05-25 NOTE — Assessment & Plan Note (Addendum)
ADDENDUM ==> New dx on EKG today which likely explains episodes of lightheadedness he has been experiencing over the past 6 weeks. Rate controlled. Reviewed pathophysiology of atrial fibrillation and increased risk of stroke. Discussed indication for antiplatelet agent but pt hesitant to start. Cited 3-5% yearly risk of afib based on CHADSVASC2 score of 3-4. Will increase aspirin to 81mg  daily and refer to cardiology. Pt agrees with plan.

## 2020-05-25 NOTE — Progress Notes (Signed)
This visit was conducted in person.  BP 140/62 (BP Location: Left Arm, Patient Position: Sitting, Cuff Size: Normal)   Pulse 62   Temp 97.6 F (36.4 C) (Temporal)   Ht 5\' 11"  (1.803 m)   Wt 179 lb 6 oz (81.4 kg)   SpO2 98%   BMI 25.02 kg/m   Orthostatic VS for the past 24 hrs (Last 3 readings):  BP- Lying BP- Standing at 0 minutes  05/25/20 1058 -- 130/70  05/25/20 1056 136/66 --   CC: dizziness Subjective:    Patient ID: Michael Decker, male    DOB: 30-May-1934, 86 y.o.   MRN: [id number]  HPI: Michael Decker is a 86 y.o. male presenting on 05/25/2020 for Dizziness (C/o dizzy spells and being off balance.  Occurs when going from sitting to standing position.  Fell on 05/07/20 due to dizziness.  Started 5-6 wks ago.   Pt provided log (made copy) of recent BP readings. )   Last seen 06/2019.  Seeing [facility] for L knee osteoarthritis managing with Q6 mo injections and knee brace.   ER visit 11/2019 for acute episode of diffuse body pains - thought sacroiliac pain treated with fentanyl, valium and lidoderm patch with benefit, discharged with mobic and lidoderm patch. Saw [facility] - no need for operative management. Has seen physiatrist. Follows regular home exercise program (HEP) which has helped.   Only on telmisartan 80mg  daily for years.  Has lost 10 lbs over the past 6 months - intentional.   5-6 wk h/o intermittent dizziness described as lightheadedness and disorientation. This doesn't occur when he gets out of bed. No vertigo or presyncope/syncope.  Orthostatic dizziness with fall 05/07/2020 occurred when he got up from couch while watching TV. Landed near fireplace, bruised L lat thigh/buttock  Notes polyuria and nocturia x3-4. Notes caffeine exacerbates this - drinks 3 cups caffeinated beverages.  Known BPH on avodart 0.5mg  QOD.   Denies chest pain, dyspnea, palpitation, headache.  Brings BP log - largely well controlled 120-140s/60-70s, HR 50s-80       Relevant past medical, surgical, family and social history reviewed and updated as indicated. Interim medical history since our last visit reviewed. Allergies and medications reviewed and updated. Outpatient Medications Prior to Visit  Medication Sig Dispense Refill  . Ascorbic Acid (VITAMIN C PO) Take by mouth 2 (two) times daily.     . calcium carbonate (TUMS - DOSED IN MG ELEMENTAL CALCIUM) 500 MG chewable tablet Chew 1 tablet by mouth as needed for indigestion or heartburn.    . dutasteride (AVODART) 0.5 MG capsule TAKE 1 CAPSULE BY MOUTH EVERY OTHER DAY 45 capsule 3  . Glucosamine-Chondroitin (OSTEO BI-FLEX REGULAR STRENGTH PO) Take by mouth daily.     Marielys Trinidad Docker Oil 500 MG CAPS Take 1 capsule by mouth daily.     . Magnesium 400 MG CAPS Take 1 tablet by mouth every other day. (Patient taking differently: Take 1 tablet by mouth daily. )    . Multiple Vitamins-Minerals (MULTIVITAMIN WITH MINERALS) tablet Take 1 tablet by mouth daily.    Marland Kitchen omeprazole (PRILOSEC) 40 MG capsule Take 1 capsule (40 mg total) by mouth daily as needed. 30 capsule 6  . Potassium Gluconate 550 MG TABS Take 1 tablet at bedtime by mouth.    . telmisartan (MICARDIS) 80 MG tablet Take 1 tablet (80 mg total) by mouth daily. 90 tablet 3  . Turmeric 500 MG CAPS Take 1 capsule at bedtime by mouth.    Marland Kitchen  aspirin EC 81 MG tablet Take 1 tablet (81 mg total) by mouth every Monday, Wednesday, and Friday.    Marland Kitchen azelastine (ASTELIN) 0.1 % nasal spray Place 1 spray into both nostrils 2 (two) times daily. Use in each nostril as directed (Patient not taking: Reported on 04/12/2020) 30 mL 3  . lidocaine (LIDODERM) 5 % Place 1 patch onto the skin daily. Remove & Discard patch within 12 hours or as directed by MD (Patient not taking: Reported on 04/12/2020) 30 patch 0  . loratadine (ALLERGY RELIEF) 10 MG tablet Take 10 mg by mouth daily. (Patient not taking: Reported on 04/12/2020)     No facility-administered medications prior to visit.      Per HPI unless specifically indicated in ROS section below Review of Systems Objective:  BP 140/62 (BP Location: Left Arm, Patient Position: Sitting, Cuff Size: Normal)   Pulse 62   Temp 97.6 F (36.4 C) (Temporal)   Ht 5\' 11"  (1.803 m)   Wt 179 lb 6 oz (81.4 kg)   SpO2 98%   BMI 25.02 kg/m   Wt Readings from Last 3 Encounters:  05/25/20 179 lb 6 oz (81.4 kg)  12/19/19 180 lb (81.6 kg)  07/14/19 184 lb 4 oz (83.6 kg)      Physical Exam Vitals and nursing note reviewed.  Constitutional:      Appearance: Normal appearance. He is not ill-appearing.  Eyes:     Extraocular Movements: Extraocular movements intact.     Pupils: Pupils are equal, round, and reactive to light.  Neck:     Vascular: No carotid bruit.  Cardiovascular:     Rate and Rhythm: Normal rate. Rhythm irregularly irregular.     Pulses: Normal pulses.     Heart sounds: Normal heart sounds. No murmur heard.   Pulmonary:     Effort: Pulmonary effort is normal. No respiratory distress.     Breath sounds: Normal breath sounds. No wheezing, rhonchi or rales.  Musculoskeletal:     Right lower leg: No edema.     Left lower leg: No edema.  Skin:    General: Skin is warm and dry.     Findings: No rash.  Neurological:     General: No focal deficit present.     Mental Status: He is alert.     Comments:  CN 2-12 intact except for noted decreased hearing FTN intact EOMI   Psychiatric:        Mood and Affect: Mood normal.        Behavior: Behavior normal.       Results for orders placed or performed in visit on 05/25/20  POCT Urinalysis Dipstick (Automated)  Result Value Ref Range   Color, UA yellow    Clarity, UA clear    Glucose, UA Negative Negative   Bilirubin, UA negative    Ketones, UA negative    Spec Grav, UA 1.015 1.010 - 1.025   Blood, UA negative    pH, UA 6.0 5.0 - 8.0   Protein, UA Negative Negative   Urobilinogen, UA 0.2 0.2 or 1.0 E.U./dL   Nitrite, UA negative    Leukocytes, UA  Negative Negative   EKG - atrial fibrillation, ~rate of 80s Assessment & Plan:  This visit occurred during the SARS-CoV-2 public health emergency.  Safety protocols were in place, including screening questions prior to the visit, additional usage of staff PPE, and extensive cleaning of exam room while observing appropriate contact time as indicated for disinfecting solutions.  Check labs as pt fasting in preparation for upcoming physical.  Problem List Items Addressed This Visit    Peripheral neuropathy   Relevant Orders   Vitamin B12   HTN (hypertension)    Chronic, stable on current regimen.       Relevant Medications   aspirin EC 81 MG tablet   HLD (hyperlipidemia)   Relevant Medications   aspirin EC 81 MG tablet   Other Relevant Orders   Lipid panel   Comprehensive metabolic panel   Dizziness    Describes lightheadedness ?orthostatic, not vertigo or syncope.  Supportive care reviewed - importance of good hydration, slow transitions.  Offered labwork today for further evaluation, he feels comfortable waiting for labs at physical 06/2020, but will let me know if worsening or progressive symptoms for sooner labwork evaluation. Check EKG for irregular rate heard.       Relevant Orders   EKG 12-Lead (Completed)   Ambulatory referral to Cardiology   Atrial fibrillation ([place]) - Primary    ADDENDUM ==> New dx on EKG today which likely explains episodes of lightheadedness he has been experiencing over the past 6 weeks. Rate controlled. Reviewed pathophysiology of atrial fibrillation and increased risk of stroke. Discussed indication for antiplatelet agent but pt hesitant to start. Cited 3-5% yearly risk of afib based on CHADSVASC2 score of 3-4. Will increase aspirin to 81mg  daily and refer to cardiology. Pt agrees with plan.       Relevant Medications   aspirin EC 81 MG tablet   Other Relevant Orders   Comprehensive metabolic panel   TSH   CBC with Differential/Platelet    Ambulatory referral to Cardiology    Other Visit Diagnoses    Polyuria       Relevant Orders   POCT Urinalysis Dipstick (Automated) (Completed)       Meds ordered this encounter  Medications  . aspirin EC 81 MG tablet    Sig: Take 1 tablet (81 mg total) by mouth daily.   Orders Placed This Encounter  Procedures  . Lipid panel  . Comprehensive metabolic panel  . TSH  . CBC with Differential/Platelet  . Vitamin B12  . Ambulatory referral to Cardiology    Referral Priority:   Routine    Referral Type:   Consultation    Referral Reason:   Specialty Services Required    Requested Specialty:   Cardiology    Number of Visits Requested:   1  . POCT Urinalysis Dipstick (Automated)  . EKG 12-Lead    Patient instructions: EKG and urine test today Make sure you stay well hydrated.  We will refer you to cardiology in [place] ([place]).  Increase aspirin to daily until you see the heart doctor.  Labs today.  Follow up plan: Return if symptoms worsen or fail to improve.  Ria Bush, MD

## 2020-05-25 NOTE — Assessment & Plan Note (Addendum)
Describes lightheadedness ?orthostatic, not vertigo or syncope.  Supportive care reviewed - importance of good hydration, slow transitions.  Offered labwork today for further evaluation, he feels comfortable waiting for labs at physical 06/2020, but will let me know if worsening or progressive symptoms for sooner labwork evaluation. Check EKG for irregular rate heard.

## 2020-05-26 ENCOUNTER — Encounter: Payer: Self-pay | Admitting: Family Medicine

## 2020-05-28 ENCOUNTER — Encounter: Payer: Self-pay | Admitting: Family Medicine

## 2020-05-28 NOTE — Telephone Encounter (Signed)
Replied via first [employer]

## 2020-06-02 ENCOUNTER — Encounter: Payer: Self-pay | Admitting: Family Medicine

## 2020-06-03 ENCOUNTER — Emergency Department
Admission: EM | Admit: 2020-06-03 | Discharge: 2020-06-03 | Disposition: A | Discharge status: Home / Self Care | Payer: PPO | Attending: Emergency Medicine | Admitting: Emergency Medicine

## 2020-06-03 ENCOUNTER — Other Ambulatory Visit: Payer: Self-pay

## 2020-06-03 ENCOUNTER — Emergency Department: Payer: PPO

## 2020-06-03 ENCOUNTER — Encounter: Payer: Self-pay | Admitting: Emergency Medicine

## 2020-06-03 DIAGNOSIS — W01198A Fall on same level from slipping, tripping and stumbling with subsequent striking against other object, initial encounter: Secondary | ICD-10-CM | POA: Insufficient documentation

## 2020-06-03 DIAGNOSIS — I10 Essential (primary) hypertension: Secondary | ICD-10-CM | POA: Diagnosis not present

## 2020-06-03 DIAGNOSIS — R0781 Pleurodynia: Secondary | ICD-10-CM | POA: Diagnosis not present

## 2020-06-03 DIAGNOSIS — Z87891 Personal history of nicotine dependence: Secondary | ICD-10-CM | POA: Diagnosis not present

## 2020-06-03 DIAGNOSIS — S299XXA Unspecified injury of thorax, initial encounter: Secondary | ICD-10-CM | POA: Diagnosis present

## 2020-06-03 DIAGNOSIS — W19XXXA Unspecified fall, initial encounter: Secondary | ICD-10-CM

## 2020-06-03 DIAGNOSIS — Z7982 Long term (current) use of aspirin: Secondary | ICD-10-CM | POA: Insufficient documentation

## 2020-06-03 DIAGNOSIS — Y92009 Unspecified place in unspecified non-institutional (private) residence as the place of occurrence of the external cause: Secondary | ICD-10-CM | POA: Insufficient documentation

## 2020-06-03 DIAGNOSIS — S20211A Contusion of right front wall of thorax, initial encounter: Secondary | ICD-10-CM | POA: Insufficient documentation

## 2020-06-03 DIAGNOSIS — Z79899 Other long term (current) drug therapy: Secondary | ICD-10-CM | POA: Diagnosis not present

## 2020-06-03 MED ORDER — HYDROCODONE-ACETAMINOPHEN 5-325 MG PO TABS
1.0000 | ORAL_TABLET | Freq: Once | ORAL | Status: AC
  Administered 2020-06-03: 1 via ORAL
  Filled 2020-06-03: qty 1

## 2020-06-03 MED ORDER — APIXABAN 5 MG PO TABS
5.0000 mg | ORAL_TABLET | Freq: Two times a day (BID) | ORAL | 3 refills | Status: DC
Start: 2020-06-03 — End: 2020-08-12

## 2020-06-03 MED ORDER — HYDROCODONE-ACETAMINOPHEN 5-325 MG PO TABS: 1.0000 | ORAL_TABLET | Freq: Four times a day (QID) | ORAL | 0 refills | Status: DC | PRN

## 2020-06-03 NOTE — Discharge Instructions (Signed)
Follow-up with your primary care provider if any continued problems or concerns.  You may use ice to your ribs as needed for discomfort.  The hydrocodone that was given to you while in the emergency department is a narcotic and could cause drowsiness.  Please be aware that drowsiness could increase your risk for falling and injury.  Wear rib belt as needed however remove it at least once an hour and take deep breaths to prevent pneumonia.  Discontinue taking the Aleve as it is an anti-inflammatory.  Return to the emergency department if any severe worsening of your symptoms or urgent concerns.

## 2020-06-03 NOTE — ED Triage Notes (Signed)
Pt reports 2 days ago fell back and hit his back on a door handle. Pt reports got better but then last pm he sneezed and his back has been hurting since. Pt offered a WC but refused

## 2020-06-03 NOTE — ED Notes (Signed)
Walked out with wife after d/c instructions reviewed with pt and wife, verbalized understanding.

## 2020-06-03 NOTE — ED Provider Notes (Signed)
[facility] Emergency Department Provider Note   ____________________________________________   First MD Initiated Contact with Patient 06/03/20 [phone]     (approximate)  I have reviewed the triage vital signs and the nursing notes.   HISTORY  Chief Complaint Back Pain   HPI LEGEND Michael Decker is a 86 y.o. male presents to the ED with complaint of right-sided back pain.  Patient states he fell 2 days ago hitting his back on a door handle.  Patient states that it was getting slightly better but then last night when he sneezed his back began hurting again.  Patient believes that he may have a fractured rib.  He denies any other injuries during his fall.  He is taken over-the-counter medication including Aleve with out any relief of his pain.  He rates pain as 10/10.        Past Medical History:  Diagnosis Date  . Arthritis    knee  . BPH (benign prostatic hypertrophy)    on avodart, released from urologist care  . Colon polyps 2013   TA 2013 [facility] Pastor)  . Diverticulosis   . GERD (gastroesophageal reflux disease)   . Hematospermia    s/p uro eval  . HLD (hyperlipidemia)    mild  . HTN (hypertension)   . Wears hearing aid in left ear     Patient Active Problem List   Diagnosis Date Noted  . Dizziness 05/25/2020  . Atrial fibrillation ([place]) 05/25/2020  . Scrotal pain 07/14/2019  . Abnormal lung sounds 07/09/2017  . Iron excess 06/28/2017  . Peripheral neuropathy 06/08/2017  . Arthritis of left knee 04/16/2017  . Health maintenance examination 07/06/2016  . Leg cramping 07/06/2016  . LBP (low back pain) 02/23/2015  . Medicare annual wellness visit, subsequent 06/23/2014  . Advanced care planning/counseling discussion 06/23/2014  . Rhinorrhea 06/23/2014  . HTN (hypertension)   . HLD (hyperlipidemia)   . GERD (gastroesophageal reflux disease)   . Benign prostatic hyperplasia   . Colon polyps     Past Surgical History:  Procedure Laterality Date   . BUNIONECTOMY Right 2014  . CATARACT EXTRACTION W/PHACO Right 08/14/2018   Procedure: CATARACT EXTRACTION PHACO AND INTRAOCULAR LENS PLACEMENT ([place])  RIGHT;  Surgeon: Leandrew Koyanagi, MD;  Location: [facility];  Service: Ophthalmology;  Laterality: Right;  requests arrival between 830 & 1030  . CATARACT EXTRACTION W/PHACO Left 09/11/2018   Procedure: CATARACT EXTRACTION PHACO AND INTRAOCULAR LENS PLACEMENT ([place])  LEFT;  Surgeon: Leandrew Koyanagi, MD;  Location: [facility];  Service: Ophthalmology;  Laterality: Left;  Doesn't want to be early  . COLONOSCOPY  2008  . COLONOSCOPY  01/2012   TA x1, diverticulosis, rpt 5 yrs [facility])  . COLONOSCOPY  02/2017   TA, diverticulosis, int hem no f/u needed [facility] Past[facility])  . CYSTECTOMY    . TONSILLECTOMY  1944  . UPPER GI ENDOSCOPY      Prior to Admission medications   Medication Sig Start Date End Date Taking? Authorizing Provider  Ascorbic Acid (VITAMIN C PO) Take by mouth 2 (two) times daily.     [provider]  aspirin EC 81 MG tablet Take 1 tablet (81 mg total) by mouth daily. 05/25/20   Ria Bush, MD  calcium carbonate (TUMS - DOSED IN MG ELEMENTAL CALCIUM) 500 MG chewable tablet Chew 1 tablet by mouth as needed for indigestion or heartburn.    [provider]  dutasteride (AVODART) 0.5 MG capsule TAKE 1 CAPSULE BY MOUTH EVERY  OTHER DAY 07/14/19   Ria Bush, MD  Glucosamine-Chondroitin (OSTEO BI-FLEX REGULAR STRENGTH PO) Take by mouth daily.     [provider]  HYDROcodone-acetaminophen (NORCO/VICODIN) 5-325 MG tablet Take 1 tablet by mouth every 6 (six) hours as needed for moderate pain or severe pain. 06/03/20   Johnn Hai, PA-C  Krill Oil 500 MG CAPS Take 1 capsule by mouth daily.     [provider]  Magnesium 400 MG CAPS Take 1 tablet by mouth every other day. Patient taking differently: Take 1 tablet by mouth daily.  07/10/18   Ria Bush, MD  Multiple  Vitamins-Minerals (MULTIVITAMIN WITH MINERALS) tablet Take 1 tablet by mouth daily.    [provider]  omeprazole (PRILOSEC) 40 MG capsule Take 1 capsule (40 mg total) by mouth daily as needed. 03/02/17   Irene Shipper, MD  Potassium Gluconate 550 MG TABS Take 1 tablet at bedtime by mouth.    [provider]  telmisartan (MICARDIS) 80 MG tablet Take 1 tablet (80 mg total) by mouth daily. 07/14/19   Ria Bush, MD  Turmeric 500 MG CAPS Take 1 capsule at bedtime by mouth.    [provider]    Allergies Patient has no known allergies.  Family History  Problem Relation Age of Onset  . Sudden death Father 49       ?MI  . Heart disease Brother 64       ?afib  . Cancer Neg Hx   . Diabetes Neg Hx   . Stroke Neg Hx   . Colon cancer Neg Hx   . Esophageal cancer Neg Hx   . Stomach cancer Neg Hx     Social History Social History   Tobacco Use  . Smoking status: Former Research scientist (life sciences)  . Smokeless tobacco: Never Used  . Tobacco comment: cigars back in the 60's  Vaping Use  . Vaping Use: Never used  Substance Use Topics  . Alcohol use: Yes    Alcohol/week: 2.0 standard drinks    Types: 1 Glasses of wine, 1 Cans of beer per week    Comment:    . Drug use: No    Review of Systems Constitutional: No fever/chills Eyes: No visual changes. ENT: No trauma. Cardiovascular: Denies chest pain. Respiratory: Denies shortness of breath.  Positive right posterior rib pain. Gastrointestinal: No abdominal pain.  No nausea, no vomiting.   Genitourinary: Negative for dysuria.  Denies hematuria. Musculoskeletal: Positive for right-sided back pain. Skin: Negative for rash. Neurological: Negative for headaches, focal weakness or numbness. ____________________________________________   PHYSICAL EXAM:  VITAL SIGNS: ED Triage Vitals  Enc Vitals Group     BP 06/03/20 0845 139/64     Pulse Rate 06/03/20 0845 83     Resp 06/03/20 0845 20     Temp 06/03/20 0845 98.4 F  (36.9 C)     Temp Source 06/03/20 0845 Oral     SpO2 06/03/20 0845 98 %     Weight 06/03/20 0842 172 lb (78 kg)     Height 06/03/20 0842 5\' 11"  (1.803 m)     Head Circumference --      Peak Flow --      Pain Score 06/03/20 0842 10     Pain Loc --      Pain Edu? --      Excl. in [place]? --     Constitutional: Alert and oriented. Well appearing and in no acute distress. Eyes: Conjunctivae are normal.  Head:  Atraumatic. Neck: No stridor.   Cardiovascular: Normal rate, regular rhythm. Grossly normal heart sounds.  Good peripheral circulation. Respiratory: Normal respiratory effort.  No retractions. Lungs CTAB.  Right posterior ribs are tender to palpation but no crepitus or deformity is noted. Gastrointestinal: Soft and nontender. No distention.  Musculoskeletal: No point tenderness is noted on palpation of the thoracic or lumbar spine.  Patient is able move upper and lower extremities without difficulty.  His gait is slow secondary to discomfort. Neurologic:  Normal speech and language. No gross focal neurologic deficits are appreciated. No gait instability. Skin:  Skin is warm, dry and intact. No rash noted. Psychiatric: Mood and affect are normal. Speech and behavior are normal.  ____________________________________________   LABS (all labs ordered are listed, but only abnormal results are displayed)  Labs Reviewed - No data to display ____________________________________________  EKG  Deferred ____________________________________________  RADIOLOGY Leana Gamer, personally viewed and evaluated these images (plain radiographs) as part of my medical decision making, as well as reviewing the written report by the radiologist.   Official radiology report(s): DG Ribs Unilateral W/Chest Right  Result Date: 06/03/2020 CLINICAL DATA:  Right rib pain.  Fall at home. EXAM: RIGHT RIBS AND CHEST - 3+ VIEW COMPARISON:  None. FINDINGS: No fracture or other bone lesions are seen  involving the ribs. There is no evidence of pneumothorax or pleural effusion. Both lungs are clear. Heart size and mediastinal contours are within normal limits. IMPRESSION: Negative. Electronically Signed   By: Franchot Gallo M.D.   On: 06/03/2020 09:28    ____________________________________________   PROCEDURES  Procedure(s) performed (including Critical Care):  Procedures   ____________________________________________   INITIAL IMPRESSION / ASSESSMENT AND PLAN / ED COURSE  As part of my medical decision making, I reviewed the following data within the electronic MEDICAL RECORD NUMBER Notes from prior ED visits and [place] Controlled Substance Database  86 year old male presents to the ED with continued right-sided rib pain after he fell 2 days ago at home hitting the handle on a door.  Patient states he was getting better until he sneezed last evening which has increased pain.  He has been taking Aleve without any relief of his pain.  Patient denies any other injuries and only has pain when he takes deep breaths.  X-rays were reassuring to the patient.  Prior to going to x-ray he was given a hydrocodone which he states has helped with the pain tremendously.  Patient continues to ambulate without any assistance although he was warned that the medication could cause drowsiness and increase his risk for falling which he understands.  A prescription for hydrocodone 12 tablets was sent to his pharmacy.  He will follow-up with his PCP if any continued problems and return to the emergency department if any severe worsening of his symptoms or urgent concerns.  ____________________________________________   FINAL CLINICAL IMPRESSION(S) / ED DIAGNOSES  Final diagnoses:  Contusion of ribs, right, initial encounter  Fall at home, initial encounter     ED Discharge Orders         Ordered    HYDROcodone-acetaminophen (NORCO/VICODIN) 5-325 MG tablet  Every 6 hours PRN        06/03/20 0942           *Please note:  CADAN MAGGART was evaluated in Emergency Department on 06/03/2020 for the symptoms described in the history of present illness. He was evaluated in the context of the global COVID-19 pandemic, which necessitated consideration that  the patient might be at risk for infection with the SARS-CoV-2 virus that causes COVID-19. Institutional protocols and algorithms that pertain to the evaluation of patients at risk for COVID-19 are in a state of rapid change based on information released by regulatory bodies including the CDC and federal and state organizations. These policies and algorithms were followed during the patient's care in the ED.  Some ED evaluations and interventions may be delayed as a result of limited staffing during and the pandemic.*   Note:  This document was prepared using Dragon voice recognition software and may include unintentional dictation errors.    Johnn Hai, PA-C 06/03/20 1103    Nena Polio, MD 06/03/20 1606

## 2020-06-03 NOTE — ED Notes (Signed)
Pt sitting bedside, with in room, pt appears in no distress.

## 2020-06-06 ENCOUNTER — Encounter: Payer: Self-pay | Admitting: Family Medicine

## 2020-06-16 ENCOUNTER — Encounter: Payer: Self-pay | Admitting: Cardiology

## 2020-06-16 ENCOUNTER — Other Ambulatory Visit: Payer: Self-pay

## 2020-06-16 ENCOUNTER — Ambulatory Visit: Payer: PPO | Admitting: Cardiology

## 2020-06-16 VITALS — BP 134/74 | HR 79 | Ht 71.0 in | Wt 182.0 lb

## 2020-06-16 DIAGNOSIS — I10 Essential (primary) hypertension: Secondary | ICD-10-CM | POA: Diagnosis not present

## 2020-06-16 DIAGNOSIS — I4891 Unspecified atrial fibrillation: Secondary | ICD-10-CM

## 2020-06-16 DIAGNOSIS — E785 Hyperlipidemia, unspecified: Secondary | ICD-10-CM

## 2020-06-16 DIAGNOSIS — Z01812 Encounter for preprocedural laboratory examination: Secondary | ICD-10-CM

## 2020-06-16 NOTE — H&P (View-Only) (Signed)
Electrophysiology Office Note:    Date:  06/16/2020   ID:  Michael Decker, DOB [birth year], MRN [id number]  PCP:  Ria Bush, MD  [place] Cardiologist:  No primary care provider on file.  [facility] Electrophysiologist:  Vickie Epley, MD   Referring MD: Ria Bush, MD   Chief Complaint: Atrial fibrillation and dizziness  History of Present Illness:    Michael Decker is a 86 y.o. male who presents for an evaluation of atrial fibrillation and dizziness at the request of Dr. Danise Mina. Their medical history includes hypertension, hyperlipidemia, GERD, BPH and arthritis.  He was recently seen by his primary care physician on May 25, 2020.  During that appointment an irregular heartbeat was detected on physical exam.  On May 11, 2020 there is a telephone encounter in his chart relating to an episode of dizziness that resulted in the patient falling.  He reported dizziness when he got up from a seated position that have been going on for a couple of months.  He was then seen at [facility] emergency department on June 03, 2020 for back pain.  His back pain was secondary to the patient falling 2 days earlier.  When he fell he hit his back on a door handle and he was concerned that he may have fractured a rib.  X-ray that day showed no obvious rib fractures or lung pathology.    Given the fall, he is only recently started his blood thinner.  He tells me he is taken for 5 doses without any bleeding issues.  Past Medical History:  Diagnosis Date  . Arthritis    knee  . BPH (benign prostatic hypertrophy)    on avodart, released from urologist care  . Colon polyps 2013   TA 2013 Henrene Pastor)  . Diverticulosis   . GERD (gastroesophageal reflux disease)   . Hematospermia    s/p uro eval  . HLD (hyperlipidemia)    mild  . HTN (hypertension)   . Wears hearing aid in left ear     Past Surgical History:  Procedure Laterality Date  . BUNIONECTOMY Right 2014  .  CATARACT EXTRACTION W/PHACO Right 08/14/2018   Procedure: CATARACT EXTRACTION PHACO AND INTRAOCULAR LENS PLACEMENT ([place])  RIGHT;  Surgeon: Leandrew Koyanagi, MD;  Location: [place];  Service: Ophthalmology;  Laterality: Right;  requests arrival between 830 & 1030  . CATARACT EXTRACTION W/PHACO Left 09/11/2018   Procedure: CATARACT EXTRACTION PHACO AND INTRAOCULAR LENS PLACEMENT ([place])  LEFT;  Surgeon: Leandrew Koyanagi, MD;  Location: [place];  Service: Ophthalmology;  Laterality: Left;  Doesn't want to be early  . COLONOSCOPY  2008  . COLONOSCOPY  01/2012   TA x1, diverticulosis, rpt 5 yrs [facility] Pastor)  . COLONOSCOPY  02/2017   TA, diverticulosis, int hem no f/u needed Henrene Pastor)  . CYSTECTOMY    . TONSILLECTOMY  1944  . UPPER GI ENDOSCOPY      Current Medications: Current Meds  Medication Sig  . apixaban (ELIQUIS) 5 MG TABS tablet Take 1 tablet (5 mg total) by mouth 2 (two) times daily.  . Ascorbic Acid (VITAMIN C PO) Take by mouth 2 (two) times daily.   . calcium carbonate (TUMS - DOSED IN MG ELEMENTAL CALCIUM) 500 MG chewable tablet Chew 1 tablet by mouth as needed for indigestion or heartburn.  . dutasteride (AVODART) 0.5 MG capsule TAKE 1 CAPSULE BY MOUTH EVERY OTHER DAY  . Glucosamine-Chondroitin (OSTEO BI-FLEX REGULAR STRENGTH PO) Take by mouth daily.   Marland Kitchen  HYDROcodone-acetaminophen (NORCO/VICODIN) 5-325 MG tablet Take 1 tablet by mouth every 6 (six) hours as needed for moderate pain or severe pain.  [name] Oil 500 MG CAPS Take 1 capsule by mouth daily.   . Magnesium 400 MG CAPS Take 1 tablet by mouth every other day. (Patient taking differently: Take 1 tablet by mouth daily. )  . Multiple Vitamins-Minerals (MULTIVITAMIN WITH MINERALS) tablet Take 1 tablet by mouth daily.  Marland Kitchen omeprazole (PRILOSEC) 40 MG capsule Take 1 capsule (40 mg total) by mouth daily as needed.  . Potassium Gluconate 550 MG TABS Take 1 tablet at bedtime by mouth.  . telmisartan (MICARDIS) 80 MG  tablet Take 1 tablet (80 mg total) by mouth daily.  . Turmeric 500 MG CAPS Take 1 capsule at bedtime by mouth.     Allergies:   Patient has no known allergies.   Social History   Socioeconomic History  . Marital status: Married    Spouse name: Not on file  . Number of children: Not on file  . Years of education: Not on file  . Highest education level: Not on file  Occupational History  . Not on file  Tobacco Use  . Smoking status: Former Research scientist (life sciences)  . Smokeless tobacco: Never Used  . Tobacco comment: cigars back in the 60's  Vaping Use  . Vaping Use: Never used  Substance and Sexual Activity  . Alcohol use: Yes    Alcohol/week: 2.0 standard drinks    Types: 1 Glasses of wine, 1 Cans of beer per week    Comment:    . Drug use: No  . Sexual activity: Yes    Partners: Female  Other Topics Concern  . Not on file  Social History Narrative   Lives with wife   Grown children   Occupation: prior worked with Clinical cytogeneticist as Recruitment consultant; volunteers at [employer] in [place]   Edu: BS   Activity: works out at [employer] 3x/wk, on recumbent bike   Diet: some water, fruits/vegetables daily         Social Determinants of Radio broadcast assistant Strain:   . Difficulty of Paying Living Expenses: Not on file  Food Insecurity: No Food Insecurity  . Worried About Charity fundraiser in the Last Year: Never true  . Ran Out of Food in the Last Year: Never true  Transportation Needs:   . Lack of Transportation (Medical): Not on file  . Lack of Transportation (Non-Medical): Not on file  Physical Activity:   . Days of Exercise per Week: Not on file  . Minutes of Exercise per Session: Not on file  Stress:   . Feeling of Stress : Not on file  Social Connections:   . Frequency of Communication with Friends and Family: Not on file  . Frequency of Social Gatherings with Friends and Family: Not on file  . Attends Religious Services: Not on file  . Active Member of Clubs or  Organizations: Not on file  . Attends Archivist Meetings: Not on file  . Marital Status: Not on file     Family History: The patient's family history includes Heart disease (age of onset: 29) in his brother; Sudden death (age of onset: 17) in his father. There is no history of Cancer, Diabetes, Stroke, Colon cancer, Esophageal cancer, or Stomach cancer.  ROS:   Please see the history of present illness.    All other systems reviewed and are negative.  EKGs/Labs/Other Studies  Reviewed:    The following studies were reviewed today: EKG  May 25, 2020 EKG personally reviewed shows coarse atrial fibrillation  EKG:  The ekg ordered today demonstrates atrial fibrillation with low voltage.  Recent Labs: 05/25/2020: ALT 19; BUN 23; Creatinine, Ser 1.20; Hemoglobin 15.2; Platelets 200.0; Potassium 4.3; Sodium 135; TSH 1.05  Recent Lipid Panel    Component Value Date/Time   CHOL 159 05/25/2020 1245   TRIG 90.0 05/25/2020 1245   TRIG 205 06/17/2013 0000   HDL 65.90 05/25/2020 1245   CHOLHDL 2 05/25/2020 1245   VLDL 18.0 05/25/2020 1245   LDLCALC 76 05/25/2020 1245   LDLCALC 82 06/17/2013 0000    Physical Exam:    VS:  BP 134/74   Pulse 79   Ht 5\' 11"  (1.803 m)   Wt 182 lb (82.6 kg)   SpO2 99%   BMI 25.38 kg/m     Orthostatics Lying flat 135/75, 76 Sitting 147/72, 49 Standing 133/67, 70 Standing 3 minutes 153/71, 64    Wt Readings from Last 3 Encounters:  06/16/20 182 lb (82.6 kg)  06/03/20 172 lb (78 kg)  05/25/20 179 lb 6 oz (81.4 kg)     GEN:  Well nourished, well developed in no acute distress.  Appears younger than stated age. HEENT: Normal NECK: No JVD; No carotid bruits LYMPHATICS: No lymphadenopathy CARDIAC: Irregularly irregular, no murmurs, rubs, gallops RESPIRATORY:  Clear to auscultation without rales, wheezing or rhonchi  ABDOMEN: Soft, non-tender, non-distended MUSCULOSKELETAL:  No edema; No deformity  SKIN: Warm and dry NEUROLOGIC:   Alert and oriented x 3 PSYCHIATRIC:  Normal affect   ASSESSMENT:    1. Atrial fibrillation, unspecified type ([place])   2. Primary hypertension   3. Hyperlipidemia, unspecified hyperlipidemia type    PLAN:    In order of problems listed above:  1. New diagnosis atrial fibrillation CHA2DS2-VASc of at least 3 for age and hypertension.  Started on Eliquis 5 mg twice daily.  Is symptomatic with his atrial fibrillation with orthostatic intolerance.  Given that the symptoms have been present for 2 to 3 months, I suspect this is a relatively new diagnosis of atrial fib.  We will plan to trial cardioversion without antiarrhythmic therapy.  We will get this scheduled in 3 to 4 weeks to allow adequate time on the anticoagulant prior to the cardioversion.  I will plan on seeing him back in 2 to 3 weeks after the cardioversion to see how he feels in a determine whether or not an antiarrhythmic medication is needed.  I briefly discussed the antiarrhythmic medications and ablation today.  2.  Hypertension Well-controlled.  Continue telmisartan.  3.  Hyperlipidemia    Medication Adjustments/Labs and Tests Ordered: Current medicines are reviewed at length with the patient today.  Concerns regarding medicines are outlined above.  No orders of the defined types were placed in this encounter.  No orders of the defined types were placed in this encounter.    Signed, Lars Mage, MD, [facility]  06/16/2020 10:57 AM    Electrophysiology [facility]

## 2020-06-16 NOTE — Patient Instructions (Addendum)
Medication Instructions:  Your physician recommends that you continue on your current medications as directed. Please refer to the Current Medication list given to you today.  *If you need a refill on your cardiac medications before your next appointment, please call your pharmacy*   Lab Work: Your physician recommends that you return for lab work on: 12/[facility] at the medical mall.  You can have this done prior to your Covid screening/testing. If you have labs (blood work) drawn today and your tests are completely normal, you will receive your results only by: Marland Kitchen MyChart Message (if you have MyChart) OR . A paper copy in the mail If you have any lab test that is abnormal or we need to change your treatment, we will call you to review the results.   Testing/Procedures: Your physician has requested that you have an echocardiogram. Echocardiography is a painless test that uses sound waves to create images of your heart. It provides your doctor with information about the size and shape of your heart and how well your heart's chambers and valves are working. This procedure takes approximately one hour. There are no restrictions for this procedure.  Your physician has recommended that you have a Cardioversion (DCCV). Electrical Cardioversion uses a jolt of electricity to your heart either through paddles or wired patches attached to your chest. This is a controlled, usually prescheduled, procedure. Defibrillation is done under light anesthesia in the hospital, and you usually go home the day of the procedure. This is done to get your heart back into a normal rhythm. You are not awake for the procedure. Please see the instructions below located under "other instructions".    Follow-Up: At [facility], you and your health needs are our priority.  As part of our continuing mission to provide you with exceptional heart care, we have created designated Provider Care Teams.  These Care Teams include your  primary Cardiologist (physician) and Advanced Practice Providers (APPs -  Physician Assistants and Nurse Practitioners) who all work together to provide you with the care you need, when you need it.  Your next appointment:   6 week(s)  The format for your next appointment:   In Person  Provider:   Lars Mage, MD    Thank you for choosing [facility]!!     Other Instructions   You are scheduled for a Cardioversion on 07/09/2020 with Dr.Arida Please arrive at the [facility] at 6:30 am a.m. on the day of your procedure.  DIET INSTRUCTIONS:  Nothing to eat or drink after midnight except your medications with a              sip of water.        1) Labs: 12/15 (before your covid screening)  2) Medications:  YOU MAY TAKE ALL of your medications with a small amount of water.  3) Must have a responsible person to drive you home.  4) Bring a current list of your medications and current insurance cards.  If you have any questions after you get home, please call the office at [phone]     Electrical Cardioversion Electrical cardioversion is the delivery of a jolt of electricity to restore a normal rhythm to the heart. A rhythm that is too fast or is not regular keeps the heart from pumping well. In this procedure, sticky patches or metal paddles are placed on the chest to deliver electricity to the heart from a device. This procedure may be done  in an emergency if:  There is low or no blood pressure as a result of the heart rhythm.  Normal rhythm must be restored as fast as possible to protect the brain and heart from further damage.  It may save a life. This may also be a scheduled procedure for irregular or fast heart rhythms that are not immediately life-threatening. Tell a health care provider about:  Any allergies you have.  All medicines you are taking, including vitamins, herbs, eye drops, creams, and over-the-counter medicines.  Any problems  you or family members have had with anesthetic medicines.  Any blood disorders you have.  Any surgeries you have had.  Any medical conditions you have.  Whether you are pregnant or may be pregnant. What are the risks? Generally, this is a safe procedure. However, problems may occur, including:  Allergic reactions to medicines.  A blood clot that breaks free and travels to other parts of your body.  The possible return of an abnormal heart rhythm within hours or days after the procedure.  Your heart stopping (cardiac arrest). This is rare. What happens before the procedure? Medicines  Your health care provider may have you start taking: ? Blood-thinning medicines (anticoagulants) so your blood does not clot as easily. ? Medicines to help stabilize your heart rate and rhythm.  Ask your health care provider about: ? Changing or stopping your regular medicines. This is especially important if you are taking diabetes medicines or blood thinners. ? Taking medicines such as aspirin and ibuprofen. These medicines can thin your blood. Do not take these medicines unless your health care provider tells you to take them. ? Taking over-the-counter medicines, vitamins, herbs, and supplements. General instructions  Follow instructions from your health care provider about eating or drinking restrictions.  Plan to have someone take you home from the hospital or clinic.  If you will be going home right after the procedure, plan to have someone with you for 24 hours.  Ask your health care provider what steps will be taken to help prevent infection. These may include washing your skin with a germ-killing soap. What happens during the procedure?   An IV will be inserted into one of your veins.  Sticky patches (electrodes) or metal paddles may be placed on your chest.  You will be given a medicine to help you relax (sedative).  An electrical shock will be delivered. The procedure may vary  among health care providers and hospitals. What can I expect after the procedure?  Your blood pressure, heart rate, breathing rate, and blood oxygen level will be monitored until you leave the hospital or clinic.  Your heart rhythm will be watched to make sure it does not change.  You may have some redness on the skin where the shocks were given. Follow these instructions at home:  Do not drive for 24 hours if you were given a sedative during your procedure.  Take over-the-counter and prescription medicines only as told by your health care provider.  Ask your health care provider how to check your pulse. Check it often.  Rest for 48 hours after the procedure or as told by your health care provider.  Avoid or limit your caffeine use as told by your health care provider.  Keep all follow-up visits as told by your health care provider. This is important. Contact a health care provider if:  You feel like your heart is beating too quickly or your pulse is not regular.  You have a  serious muscle cramp that does not go away. Get help right away if:  You have discomfort in your chest.  You are dizzy or you feel faint.  You have trouble breathing or you are short of breath.  Your speech is slurred.  You have trouble moving an arm or leg on one side of your body.  Your fingers or toes turn cold or blue. Summary  Electrical cardioversion is the delivery of a jolt of electricity to restore a normal rhythm to the heart.  This procedure may be done right away in an emergency or may be a scheduled procedure if the condition is not an emergency.  Generally, this is a safe procedure.  After the procedure, check your pulse often as told by your health care provider. This information is not intended to replace advice given to you by your health care provider. Make sure you discuss any questions you have with your health care provider. Document Revised: 02/10/2019 Document Reviewed:  02/10/2019 Elsevier Patient Education  [place].

## 2020-06-16 NOTE — Progress Notes (Signed)
Electrophysiology Office Note:    Date:  06/16/2020   ID:  Michael Decker, DOB 1934-02-08, MRN [id number]  PCP:  Ria Bush, MD  [place] Cardiologist:  No primary care provider on file.  [facility] Electrophysiologist:  Vickie Epley, MD   Referring MD: Ria Bush, MD   Chief Complaint: Atrial fibrillation and dizziness  History of Present Illness:    Michael Decker is a 86 y.o. male who presents for an evaluation of atrial fibrillation and dizziness at the request of Dr. Danise Mina. Their medical history includes hypertension, hyperlipidemia, GERD, BPH and arthritis.  He was recently seen by his primary care physician on May 25, 2020.  During that appointment an irregular heartbeat was detected on physical exam.  On May 11, 2020 there is a telephone encounter in his chart relating to an episode of dizziness that resulted in the patient falling.  He reported dizziness when he got up from a seated position that have been going on for a couple of months.  He was then seen at [facility] emergency department on June 03, 2020 for back pain.  His back pain was secondary to the patient falling 2 days earlier.  When he fell he hit his back on a door handle and he was concerned that he may have fractured a rib.  X-ray that day showed no obvious rib fractures or lung pathology.    Given the fall, he is only recently started his blood thinner.  He tells me he is taken for 5 doses without any bleeding issues.  Past Medical History:  Diagnosis Date  . Arthritis    knee  . BPH (benign prostatic hypertrophy)    on avodart, released from urologist care  . Colon polyps 2013   TA 2013 Henrene Pastor)  . Diverticulosis   . GERD (gastroesophageal reflux disease)   . Hematospermia    s/p uro eval  . HLD (hyperlipidemia)    mild  . HTN (hypertension)   . Wears hearing aid in left ear     Past Surgical History:  Procedure Laterality Date  . BUNIONECTOMY Right 2014  .  CATARACT EXTRACTION W/PHACO Right 08/14/2018   Procedure: CATARACT EXTRACTION PHACO AND INTRAOCULAR LENS PLACEMENT ([place])  RIGHT;  Surgeon: Leandrew Koyanagi, MD;  Location: [place];  Service: Ophthalmology;  Laterality: Right;  requests arrival between 830 & 1030  . CATARACT EXTRACTION W/PHACO Left 09/11/2018   Procedure: CATARACT EXTRACTION PHACO AND INTRAOCULAR LENS PLACEMENT ([place])  LEFT;  Surgeon: Leandrew Koyanagi, MD;  Location: [place];  Service: Ophthalmology;  Laterality: Left;  Doesn't want to be early  . COLONOSCOPY  2008  . COLONOSCOPY  01/2012   TA x1, diverticulosis, rpt 5 yrs Henrene Pastor)  . COLONOSCOPY  02/2017   TA, diverticulosis, int hem no f/u needed Henrene Pastor)  . CYSTECTOMY    . TONSILLECTOMY  1944  . UPPER GI ENDOSCOPY      Current Medications: Current Meds  Medication Sig  . apixaban (ELIQUIS) 5 MG TABS tablet Take 1 tablet (5 mg total) by mouth 2 (two) times daily.  . Ascorbic Acid (VITAMIN C PO) Take by mouth 2 (two) times daily.   . calcium carbonate (TUMS - DOSED IN MG ELEMENTAL CALCIUM) 500 MG chewable tablet Chew 1 tablet by mouth as needed for indigestion or heartburn.  . dutasteride (AVODART) 0.5 MG capsule TAKE 1 CAPSULE BY MOUTH EVERY OTHER DAY  . Glucosamine-Chondroitin (OSTEO BI-FLEX REGULAR STRENGTH PO) Take by mouth daily.   Marland Kitchen  HYDROcodone-acetaminophen (NORCO/VICODIN) 5-325 MG tablet Take 1 tablet by mouth every 6 (six) hours as needed for moderate pain or severe pain.  [name] Oil 500 MG CAPS Take 1 capsule by mouth daily.   . Magnesium 400 MG CAPS Take 1 tablet by mouth every other day. (Patient taking differently: Take 1 tablet by mouth daily. )  . Multiple Vitamins-Minerals (MULTIVITAMIN WITH MINERALS) tablet Take 1 tablet by mouth daily.  Marland Kitchen omeprazole (PRILOSEC) 40 MG capsule Take 1 capsule (40 mg total) by mouth daily as needed.  . Potassium Gluconate 550 MG TABS Take 1 tablet at bedtime by mouth.  . telmisartan (MICARDIS) 80 MG  tablet Take 1 tablet (80 mg total) by mouth daily.  . Turmeric 500 MG CAPS Take 1 capsule at bedtime by mouth.     Allergies:   Patient has no known allergies.   Social History   Socioeconomic History  . Marital status: Married    Spouse name: Not on file  . Number of children: Not on file  . Years of education: Not on file  . Highest education level: Not on file  Occupational History  . Not on file  Tobacco Use  . Smoking status: Former Research scientist (life sciences)  . Smokeless tobacco: Never Used  . Tobacco comment: cigars back in the 60's  Vaping Use  . Vaping Use: Never used  Substance and Sexual Activity  . Alcohol use: Yes    Alcohol/week: 2.0 standard drinks    Types: 1 Glasses of wine, 1 Cans of beer per week    Comment:    . Drug use: No  . Sexual activity: Yes    Partners: Female  Other Topics Concern  . Not on file  Social History Narrative   Lives with wife   Grown children   Occupation: prior worked with Clinical cytogeneticist as Recruitment consultant; volunteers at [employer] for [employer] in [place]   Edu: BS   Activity: works out at [employer] 3x/wk, on recumbent bike   Diet: some water, fruits/vegetables daily         Social Determinants of Radio broadcast assistant Strain:   . Difficulty of Paying Living Expenses: Not on file  Food Insecurity: No Food Insecurity  . Worried About Charity fundraiser in the Last Year: Never true  . Ran Out of Food in the Last Year: Never true  Transportation Needs:   . Lack of Transportation (Medical): Not on file  . Lack of Transportation (Non-Medical): Not on file  Physical Activity:   . Days of Exercise per Week: Not on file  . Minutes of Exercise per Session: Not on file  Stress:   . Feeling of Stress : Not on file  Social Connections:   . Frequency of Communication with Friends and Family: Not on file  . Frequency of Social Gatherings with Friends and Family: Not on file  . Attends Religious Services: Not on file  . Active Member of Clubs or  Organizations: Not on file  . Attends Archivist Meetings: Not on file  . Marital Status: Not on file     Family History: The patient's family history includes Heart disease (age of onset: 21) in his brother; Sudden death (age of onset: 39) in his father. There is no history of Cancer, Diabetes, Stroke, Colon cancer, Esophageal cancer, or Stomach cancer.  ROS:   Please see the history of present illness.    All other systems reviewed and are negative.  EKGs/Labs/Other Studies  Reviewed:    The following studies were reviewed today: EKG  May 25, 2020 EKG personally reviewed shows coarse atrial fibrillation  EKG:  The ekg ordered today demonstrates atrial fibrillation with low voltage.  Recent Labs: 05/25/2020: ALT 19; BUN 23; Creatinine, Ser 1.20; Hemoglobin 15.2; Platelets 200.0; Potassium 4.3; Sodium 135; TSH 1.05  Recent Lipid Panel    Component Value Date/Time   CHOL 159 05/25/2020 1245   TRIG 90.0 05/25/2020 1245   TRIG 205 06/17/2013 0000   HDL 65.90 05/25/2020 1245   CHOLHDL 2 05/25/2020 1245   VLDL 18.0 05/25/2020 1245   LDLCALC 76 05/25/2020 1245   LDLCALC 82 06/17/2013 0000    Physical Exam:    VS:  BP 134/74   Pulse 79   Ht 5\' 11"  (1.803 m)   Wt 182 lb (82.6 kg)   SpO2 99%   BMI 25.38 kg/m     Orthostatics Lying flat 135/75, 76 Sitting 147/72, 49 Standing 133/67, 70 Standing 3 minutes 153/71, 64    Wt Readings from Last 3 Encounters:  06/16/20 182 lb (82.6 kg)  06/03/20 172 lb (78 kg)  05/25/20 179 lb 6 oz (81.4 kg)     GEN:  Well nourished, well developed in no acute distress.  Appears younger than stated age. HEENT: Normal NECK: No JVD; No carotid bruits LYMPHATICS: No lymphadenopathy CARDIAC: Irregularly irregular, no murmurs, rubs, gallops RESPIRATORY:  Clear to auscultation without rales, wheezing or rhonchi  ABDOMEN: Soft, non-tender, non-distended MUSCULOSKELETAL:  No edema; No deformity  SKIN: Warm and dry NEUROLOGIC:   Alert and oriented x 3 PSYCHIATRIC:  Normal affect   ASSESSMENT:    1. Atrial fibrillation, unspecified type ([place])   2. Primary hypertension   3. Hyperlipidemia, unspecified hyperlipidemia type    PLAN:    In order of problems listed above:  1. New diagnosis atrial fibrillation CHA2DS2-VASc of at least 3 for age and hypertension.  Started on Eliquis 5 mg twice daily.  Is symptomatic with his atrial fibrillation with orthostatic intolerance.  Given that the symptoms have been present for 2 to 3 months, I suspect this is a relatively new diagnosis of atrial fib.  We will plan to trial cardioversion without antiarrhythmic therapy.  We will get this scheduled in 3 to 4 weeks to allow adequate time on the anticoagulant prior to the cardioversion.  I will plan on seeing him back in 2 to 3 weeks after the cardioversion to see how he feels in a determine whether or not an antiarrhythmic medication is needed.  I briefly discussed the antiarrhythmic medications and ablation today.  2.  Hypertension Well-controlled.  Continue telmisartan.  3.  Hyperlipidemia    Medication Adjustments/Labs and Tests Ordered: Current medicines are reviewed at length with the patient today.  Concerns regarding medicines are outlined above.  No orders of the defined types were placed in this encounter.  No orders of the defined types were placed in this encounter.    Signed, Lars Mage, MD, [facility]  06/16/2020 10:57 AM    Electrophysiology [facility]

## 2020-06-28 ENCOUNTER — Telehealth: Payer: Self-pay | Admitting: Cardiovascular Disease

## 2020-06-28 NOTE — Telephone Encounter (Signed)
Patient would like to discuss his restrictions for after his cardioversion. Patient states he has a Christmas dinner that night and would like to know if he can attend. Patient states that it is ok if a message is left.  Please call to discuss .

## 2020-06-28 NOTE — Telephone Encounter (Signed)
Called pt, no answer, left message on voicemail per pt request notified: that as long as he feels up to attending Christmas dinner after cardioversion that should be fine, as long as he does not drive and we advise that he does not drink any alcoholic beverages after having anesthesia. Notified pt on vm that if he has any questions regarding this message he may call our office to discuss further.

## 2020-07-02 NOTE — Addendum Note (Signed)
Addended by: Anselm Pancoast on: 07/02/2020 04:23 PM   Modules accepted: Orders

## 2020-07-07 ENCOUNTER — Other Ambulatory Visit
Admission: RE | Admit: 2020-07-07 | Discharge: 2020-07-07 | Disposition: A | Discharge status: Home / Self Care | Payer: PPO | Source: Ambulatory Visit | Attending: Cardiovascular Disease | Admitting: Cardiovascular Disease

## 2020-07-07 DIAGNOSIS — I4891 Unspecified atrial fibrillation: Secondary | ICD-10-CM

## 2020-07-07 DIAGNOSIS — Z01812 Encounter for preprocedural laboratory examination: Secondary | ICD-10-CM | POA: Insufficient documentation

## 2020-07-07 DIAGNOSIS — Z20822 Contact with and (suspected) exposure to covid-19: Secondary | ICD-10-CM | POA: Insufficient documentation

## 2020-07-07 LAB — CBC
HCT: 45.1 % (ref 39.0–52.0)
Hemoglobin: 15.2 g/dL (ref 13.0–17.0)
MCH: 34.2 pg — ABNORMAL HIGH (ref 26.0–34.0)
MCHC: 33.7 g/dL (ref 30.0–36.0)
MCV: 101.6 fL — ABNORMAL HIGH (ref 80.0–100.0)
Platelets: 209 10*3/uL (ref 150–400)
RBC: 4.44 MIL/uL (ref 4.22–5.81)
RDW: 12.7 % (ref 11.5–15.5)
WBC: 7.6 10*3/uL (ref 4.0–10.5)
nRBC: 0 % (ref 0.0–0.2)

## 2020-07-07 LAB — BASIC METABOLIC PANEL
Anion gap: 7 (ref 5–15)
BUN: 22 mg/dL (ref 8–23)
CO2: 29 mmol/L (ref 22–32)
Calcium: 9.3 mg/dL (ref 8.9–10.3)
Chloride: 101 mmol/L (ref 98–111)
Creatinine, Ser: 1.14 mg/dL (ref 0.61–1.24)
GFR, Estimated: >60 mL/min (ref >60)
Glucose, Bld: 88 mg/dL (ref 70–99)
Potassium: 4.5 mmol/L (ref 3.5–5.1)
Sodium: 137 mmol/L (ref 135–145)

## 2020-07-07 LAB — SARS CORONAVIRUS 2 (TAT 6-24 HRS): SARS Coronavirus 2: NEGATIVE

## 2020-07-07 LAB — PROTIME-INR
INR: 1.1 (ref 0.8–1.2)
Prothrombin Time: 14.2 seconds (ref 11.4–15.2)

## 2020-07-09 ENCOUNTER — Other Ambulatory Visit: Payer: PPO

## 2020-07-09 ENCOUNTER — Ambulatory Visit
Admission: RE | Admit: 2020-07-09 | Discharge: 2020-07-09 | Disposition: A | Discharge status: Home / Self Care | Payer: PPO | Attending: Cardiovascular Disease | Admitting: Cardiovascular Disease

## 2020-07-09 ENCOUNTER — Other Ambulatory Visit: Payer: Self-pay

## 2020-07-09 ENCOUNTER — Encounter: Payer: Self-pay | Admitting: Cardiovascular Disease

## 2020-07-09 ENCOUNTER — Ambulatory Visit: Payer: PPO | Admitting: Certified Registered"

## 2020-07-09 ENCOUNTER — Encounter
Admission: RE | Disposition: A | Discharge status: Home / Self Care | Payer: Self-pay | Source: Home / Self Care | Attending: Cardiovascular Disease

## 2020-07-09 DIAGNOSIS — E785 Hyperlipidemia, unspecified: Secondary | ICD-10-CM | POA: Insufficient documentation

## 2020-07-09 DIAGNOSIS — I4891 Unspecified atrial fibrillation: Secondary | ICD-10-CM | POA: Diagnosis not present

## 2020-07-09 DIAGNOSIS — I4819 Other persistent atrial fibrillation: Secondary | ICD-10-CM

## 2020-07-09 DIAGNOSIS — Z7901 Long term (current) use of anticoagulants: Secondary | ICD-10-CM | POA: Insufficient documentation

## 2020-07-09 DIAGNOSIS — K219 Gastro-esophageal reflux disease without esophagitis: Secondary | ICD-10-CM | POA: Diagnosis not present

## 2020-07-09 DIAGNOSIS — N400 Enlarged prostate without lower urinary tract symptoms: Secondary | ICD-10-CM | POA: Diagnosis not present

## 2020-07-09 DIAGNOSIS — I10 Essential (primary) hypertension: Secondary | ICD-10-CM | POA: Diagnosis not present

## 2020-07-09 DIAGNOSIS — Z87891 Personal history of nicotine dependence: Secondary | ICD-10-CM | POA: Insufficient documentation

## 2020-07-09 HISTORY — PX: CARDIOVERSION: SHX1299

## 2020-07-09 SURGERY — CARDIOVERSION
Anesthesia: General

## 2020-07-09 MED ORDER — SODIUM CHLORIDE 0.9 % IV SOLN: INTRAVENOUS | Status: DC | PRN

## 2020-07-09 MED ORDER — PROPOFOL 10 MG/ML IV BOLUS
INTRAVENOUS | Status: DC | PRN
  Administered 2020-07-09: 20 mg via INTRAVENOUS
  Administered 2020-07-09: 50 mg via INTRAVENOUS

## 2020-07-09 MED ORDER — METOPROLOL TARTRATE 25 MG PO TABS: 25.0000 mg | ORAL_TABLET | Freq: Two times a day (BID) | ORAL | 6 refills | Status: DC

## 2020-07-09 MED ORDER — PROPOFOL 10 MG/ML IV BOLUS
INTRAVENOUS | Status: AC
  Filled 2020-07-09: qty 20

## 2020-07-09 NOTE — Transfer of Care (Signed)
Immediate Anesthesia Transfer of Care Note  Patient: Michael Decker  Procedure(s) Performed: CARDIOVERSION (N/A )  Patient Location: PACU and Cath Lab  Anesthesia Type:General  Level of Consciousness: drowsy  Airway & Oxygen Therapy: Patient Spontanous Breathing and Patient connected to nasal cannula oxygen  Post-op Assessment: Report given to RN  Post vital signs: stable  Last Vitals:  Vitals Value Taken Time  BP 137/75 07/09/20 0743  Temp    Pulse 143 07/09/20 0744  Resp 22 07/09/20 0744  SpO2 92 % 07/09/20 0744    Last Pain:  Vitals:   07/09/20 0711  PainSc: 0-No pain         Complications: No complications documented.

## 2020-07-09 NOTE — CV Procedure (Addendum)
Cardioversion note: A standard informed consent was obtained. Timeout was performed. The pads were placed in the anterior posterior fashion. The patient was given propofol by the anesthesia team.   cardioversion was performed with a 200 J. He was shocked 3 times. Each time he converted to sinus rhythm for about 1 minute then reverted back to A-fib Pre-and post EKGs were reviewed. The patient tolerated the procedure with no immediate complications.  Recommendations: Unsuccessful DCCV for A-fib. Shocked 3 times. Each time he converted to sinus rhythm for about 1 minute then reverted back to A-fib The patient converted to sinus rhythm. Will add rate control medication.  An antiarrhythmic medication is needed if rhythm control is pursued.

## 2020-07-09 NOTE — Discharge Instructions (Signed)
Moderate Conscious Sedation, Adult, Care After These instructions provide you with information about caring for yourself after your procedure. Your health care provider may also give you more specific instructions. Your treatment has been planned according to current medical practices, but problems sometimes occur. Call your health care provider if you have any problems or questions after your procedure. What can I expect after the procedure? After your procedure, it is common:  To feel sleepy for several hours.  To feel clumsy and have poor balance for several hours.  To have poor judgment for several hours.  To vomit if you eat too soon. Follow these instructions at home: For at least 24 hours after the procedure:   Do not: ? Participate in activities where you could fall or become injured. ? Drive. ? Use heavy machinery. ? Drink alcohol. ? Take sleeping pills or medicines that cause drowsiness. ? Make important decisions or sign legal documents. ? Take care of children on your own.  Rest. Eating and drinking  Follow the diet recommended by your health care provider.  If you vomit: ? Drink water, juice, or soup when you can drink without vomiting. ? Make sure you have little or no nausea before eating solid foods. General instructions  Have a responsible adult stay with you until you are awake and alert.  Take over-the-counter and prescription medicines only as told by your health care provider.  If you smoke, do not smoke without supervision.  Keep all follow-up visits as told by your health care provider. This is important. Contact a health care provider if:  You keep feeling nauseous or you keep vomiting.  You feel light-headed.  You develop a rash.  You have a fever. Get help right away if:  You have trouble breathing. This information is not intended to replace advice given to you by your health care provider. Make sure you discuss any questions you have  with your health care provider. Document Revised: 06/22/2017 Document Reviewed: 10/30/2015 Elsevier Patient Education  [place]. Hospital doctor cardioversion is the delivery of a jolt of electricity to restore a normal rhythm to the heart. A rhythm that is too fast or is not regular keeps the heart from pumping well. In this procedure, sticky patches or metal paddles are placed on the chest to deliver electricity to the heart from a device. This procedure may be done in an emergency if:  There is low or no blood pressure as a result of the heart rhythm.  Normal rhythm must be restored as fast as possible to protect the brain and heart from further damage.  It may save a life. This may also be a scheduled procedure for irregular or fast heart rhythms that are not immediately life-threatening. Tell a health care provider about:  Any allergies you have.  All medicines you are taking, including vitamins, herbs, eye drops, creams, and over-the-counter medicines.  Any problems you or family members have had with anesthetic medicines.  Any blood disorders you have.  Any surgeries you have had.  Any medical conditions you have.  Whether you are pregnant or may be pregnant. What are the risks? Generally, this is a safe procedure. However, problems may occur, including:  Allergic reactions to medicines.  A blood clot that breaks free and travels to other parts of your body.  The possible return of an abnormal heart rhythm within hours or days after the procedure.  Your heart stopping (cardiac arrest). This is rare. What happens  before the procedure? Medicines  Your health care provider may have you start taking: ? Blood-thinning medicines (anticoagulants) so your blood does not clot as easily. ? Medicines to help stabilize your heart rate and rhythm.  Ask your health care provider about: ? Changing or stopping your regular medicines. This is  especially important if you are taking diabetes medicines or blood thinners. ? Taking medicines such as aspirin and ibuprofen. These medicines can thin your blood. Do not take these medicines unless your health care provider tells you to take them. ? Taking over-the-counter medicines, vitamins, herbs, and supplements. General instructions  Follow instructions from your health care provider about eating or drinking restrictions.  Plan to have someone take you home from the hospital or clinic.  If you will be going home right after the procedure, plan to have someone with you for 24 hours.  Ask your health care provider what steps will be taken to help prevent infection. These may include washing your skin with a germ-killing soap. What happens during the procedure?   An IV will be inserted into one of your veins.  Sticky patches (electrodes) or metal paddles may be placed on your chest.  You will be given a medicine to help you relax (sedative).  An electrical shock will be delivered. The procedure may vary among health care providers and hospitals. What can I expect after the procedure?  Your blood pressure, heart rate, breathing rate, and blood oxygen level will be monitored until you leave the hospital or clinic.  Your heart rhythm will be watched to make sure it does not change.  You may have some redness on the skin where the shocks were given. Follow these instructions at home:  Do not drive for 24 hours if you were given a sedative during your procedure.  Take over-the-counter and prescription medicines only as told by your health care provider.  Ask your health care provider how to check your pulse. Check it often.  Rest for 48 hours after the procedure or as told by your health care provider.  Avoid or limit your caffeine use as told by your health care provider.  Keep all follow-up visits as told by your health care provider. This is important. Contact a health  care provider if:  You feel like your heart is beating too quickly or your pulse is not regular.  You have a serious muscle cramp that does not go away. Get help right away if:  You have discomfort in your chest.  You are dizzy or you feel faint.  You have trouble breathing or you are short of breath.  Your speech is slurred.  You have trouble moving an arm or leg on one side of your body.  Your fingers or toes turn cold or blue. Summary  Electrical cardioversion is the delivery of a jolt of electricity to restore a normal rhythm to the heart.  This procedure may be done right away in an emergency or may be a scheduled procedure if the condition is not an emergency.  Generally, this is a safe procedure.  After the procedure, check your pulse often as told by your health care provider. This information is not intended to replace advice given to you by your health care provider. Make sure you discuss any questions you have with your health care provider. Document Revised: 02/10/2019 Document Reviewed: 02/10/2019 Elsevier Patient Education  [place].

## 2020-07-09 NOTE — Anesthesia Preprocedure Evaluation (Signed)
Anesthesia Evaluation  Patient identified by MRN, date of birth, ID band Patient awake    Reviewed: Allergy & Precautions, H&P , NPO status , Patient's Chart, lab work & pertinent test results, reviewed documented beta blocker date and time   Airway Mallampati: II   Neck ROM: full    Dental  (+) Poor Dentition   Pulmonary neg pulmonary ROS, former smoker,    Pulmonary exam normal        Cardiovascular Exercise Tolerance: Good hypertension, On Medications negative cardio ROS Normal cardiovascular exam Rhythm:regular Rate:Normal     Neuro/Psych  Neuromuscular disease negative psych ROS   GI/Hepatic Neg liver ROS, GERD  Medicated,  Endo/Other  negative endocrine ROS  Renal/GU negative Renal ROS  negative genitourinary   Musculoskeletal   Abdominal   Peds  Hematology negative hematology ROS (+)   Anesthesia Other Findings Past Medical History: No date: Arthritis     Comment:  knee No date: BPH (benign prostatic hypertrophy)     Comment:  on avodart, released from urologist care 2013: Colon polyps     Comment:  TA 2013 Henrene Pastor) No date: Diverticulosis No date: GERD (gastroesophageal reflux disease) No date: Hematospermia     Comment:  s/p uro eval No date: HLD (hyperlipidemia)     Comment:  mild No date: HTN (hypertension) No date: Wears hearing aid in left ear Past Surgical History: 2014: BUNIONECTOMY; Right 08/14/2018: CATARACT EXTRACTION W/PHACO; Right     Comment:  Procedure: CATARACT EXTRACTION PHACO AND INTRAOCULAR               LENS PLACEMENT ([place])  RIGHT;  Surgeon: Leandrew Koyanagi, MD;  Location: [place];  Service:               Ophthalmology;  Laterality: Right;  requests arrival               between 830 & 1030 09/11/2018: CATARACT EXTRACTION W/PHACO; Left     Comment:  Procedure: CATARACT EXTRACTION PHACO AND INTRAOCULAR               LENS PLACEMENT ([place])  LEFT;   Surgeon: Leandrew Koyanagi, MD;  Location: [place];  Service:               Ophthalmology;  Laterality: Left;  Doesn't want to be               early 2008: COLONOSCOPY 01/2012: COLONOSCOPY     Comment:  TA x1, diverticulosis, rpt 5 yrs Henrene Pastor) 02/2017: COLONOSCOPY     Comment:  TA, diverticulosis, int hem no f/u needed Henrene Pastor) No date: CYSTECTOMY 1944: TONSILLECTOMY No date: UPPER GI ENDOSCOPY BMI    Body Mass Index: 24.41 kg/m     Reproductive/Obstetrics negative OB ROS                             Anesthesia Physical Anesthesia Plan  ASA: IV  Anesthesia Plan: General   Post-op Pain Management:    Induction:   PONV Risk Score and Plan:   Airway Management Planned:   Additional Equipment:   Intra-op Plan:   Post-operative Plan:   Informed Consent: I have reviewed the patients History and Physical, chart, labs and discussed the procedure including the risks, benefits and alternatives  for the proposed anesthesia with the patient or authorized representative who has indicated his/her understanding and acceptance.     Dental Advisory Given  Plan Discussed with: CRNA  Anesthesia Plan Comments:         Anesthesia Quick Evaluation

## 2020-07-09 NOTE — Interval H&P Note (Signed)
History and Physical Interval Note:  07/09/2020 7:49 AM  [name]  has presented today for surgery, with the diagnosis of Cardioversion   Afib.  The various methods of treatment have been discussed with the patient and family. After consideration of risks, benefits and other options for treatment, the patient has consented to  Procedure(s): CARDIOVERSION (N/A) as a surgical intervention.  The patient's history has been reviewed, patient examined, no change in status, stable for surgery.  I have reviewed the patient's chart and labs.  Questions were answered to the patient's satisfaction.     Kathlyn [place]

## 2020-07-09 NOTE — Anesthesia Postprocedure Evaluation (Signed)
Anesthesia Post Note  Patient: Michael Decker  Procedure(s) Performed: CARDIOVERSION (N/A )  Patient location during evaluation: PACU Anesthesia Type: General Level of consciousness: awake and alert Pain management: pain level controlled Vital Signs Assessment: post-procedure vital signs reviewed and stable Respiratory status: spontaneous breathing, nonlabored ventilation, respiratory function stable and patient connected to nasal cannula oxygen Cardiovascular status: blood pressure returned to baseline and stable Postop Assessment: no apparent nausea or vomiting Anesthetic complications: no   No complications documented.   Last Vitals:  Vitals:   07/09/20 0743 07/09/20 0744  BP: 137/75   Pulse:  (!) 143  Resp: (!) 24 (!) 22  Temp:    SpO2:  92%    Last Pain:  Vitals:   07/09/20 0711  PainSc: 0-No pain                 Molli Barrows

## 2020-07-13 ENCOUNTER — Other Ambulatory Visit: Payer: Self-pay

## 2020-07-13 ENCOUNTER — Ambulatory Visit (INDEPENDENT_AMBULATORY_CARE_PROVIDER_SITE_OTHER): Payer: PPO

## 2020-07-13 DIAGNOSIS — I4891 Unspecified atrial fibrillation: Secondary | ICD-10-CM | POA: Diagnosis not present

## 2020-07-14 ENCOUNTER — Encounter: Payer: PPO | Admitting: Family Medicine

## 2020-07-14 ENCOUNTER — Ambulatory Visit (INDEPENDENT_AMBULATORY_CARE_PROVIDER_SITE_OTHER): Payer: PPO | Admitting: Family Medicine

## 2020-07-14 ENCOUNTER — Encounter: Payer: Self-pay | Admitting: Family Medicine

## 2020-07-14 VITALS — BP 130/64 | HR 56 | Temp 97.7°F | Ht 70.0 in | Wt 182.1 lb

## 2020-07-14 DIAGNOSIS — N401 Enlarged prostate with lower urinary tract symptoms: Secondary | ICD-10-CM

## 2020-07-14 DIAGNOSIS — K219 Gastro-esophageal reflux disease without esophagitis: Secondary | ICD-10-CM

## 2020-07-14 DIAGNOSIS — Z0000 Encounter for general adult medical examination without abnormal findings: Secondary | ICD-10-CM | POA: Diagnosis not present

## 2020-07-14 DIAGNOSIS — I4819 Other persistent atrial fibrillation: Secondary | ICD-10-CM

## 2020-07-14 DIAGNOSIS — R351 Nocturia: Secondary | ICD-10-CM

## 2020-07-14 DIAGNOSIS — E785 Hyperlipidemia, unspecified: Secondary | ICD-10-CM

## 2020-07-14 DIAGNOSIS — I10 Essential (primary) hypertension: Secondary | ICD-10-CM

## 2020-07-14 NOTE — Assessment & Plan Note (Addendum)
Chronic, overall stable. Continue current regimen. Will continue watching for low blood pressure readings. If persistent, will recommend lowering telmisartan dose.

## 2020-07-14 NOTE — Assessment & Plan Note (Addendum)
Continues avodart QOD. Ongoing nocturia. No longer sees urology.

## 2020-07-14 NOTE — Assessment & Plan Note (Signed)

## 2020-07-14 NOTE — Patient Instructions (Addendum)
If interested, check with pharmacy about new 2 shot shingles series (shingrix).  Return as needed or in 6 months for follow up visit  Good to see you today.  Health Maintenance After Age 84 After age 22, you are at a higher risk for certain long-term diseases and infections as well as injuries from falls. Falls are a major cause of broken bones and head injuries in people who are older than age 67. Getting regular preventive care can help to keep you healthy and well. Preventive care includes getting regular testing and making lifestyle changes as recommended by your health care provider. Talk with your health care provider about:  Which screenings and tests you should have. A screening is a test that checks for a disease when you have no symptoms.  A diet and exercise plan that is right for you. What should I know about screenings and tests to prevent falls? Screening and testing are the best ways to find a health problem early. Early diagnosis and treatment give you the best chance of managing medical conditions that are common after age 23. Certain conditions and lifestyle choices may make you more likely to have a fall. Your health care provider may recommend:  Regular vision checks. Poor vision and conditions such as cataracts can make you more likely to have a fall. If you wear glasses, make sure to get your prescription updated if your vision changes.  Medicine review. Work with your health care provider to regularly review all of the medicines you are taking, including over-the-counter medicines. Ask your health care provider about any side effects that may make you more likely to have a fall. Tell your health care provider if any medicines that you take make you feel dizzy or sleepy.  Osteoporosis screening. Osteoporosis is a condition that causes the bones to get weaker. This can make the bones weak and cause them to break more easily.  Blood pressure screening. Blood pressure changes  and medicines to control blood pressure can make you feel dizzy.  Strength and balance checks. Your health care provider may recommend certain tests to check your strength and balance while standing, walking, or changing positions.  Foot health exam. Foot pain and numbness, as well as not wearing proper footwear, can make you more likely to have a fall.  Depression screening. You may be more likely to have a fall if you have a fear of falling, feel emotionally low, or feel unable to do activities that you used to do.  Alcohol use screening. Using too much alcohol can affect your balance and may make you more likely to have a fall. What actions can I take to lower my risk of falls? General instructions  Talk with your health care provider about your risks for falling. Tell your health care provider if: ? You fall. Be sure to tell your health care provider about all falls, even ones that seem minor. ? You feel dizzy, sleepy, or off-balance.  Take over-the-counter and prescription medicines only as told by your health care provider. These include any supplements.  Eat a healthy diet and maintain a healthy weight. A healthy diet includes low-fat dairy products, low-fat (lean) meats, and fiber from whole grains, beans, and lots of fruits and vegetables. Home safety  Remove any tripping hazards, such as rugs, cords, and clutter.  Install safety equipment such as grab bars in bathrooms and safety rails on stairs.  Keep rooms and walkways well-lit. Activity   Follow a regular exercise  program to stay fit. This will help you maintain your balance. Ask your health care provider what types of exercise are appropriate for you.  If you need a cane or walker, use it as recommended by your health care provider.  Wear supportive shoes that have nonskid soles. Lifestyle  Do not drink alcohol if your health care provider tells you not to drink.  If you drink alcohol, limit how much you  have: ? 0-1 drink a day for women. ? 0-2 drinks a day for men.  Be aware of how much alcohol is in your drink. In the [place] one drink equals one typical bottle of beer (12 oz), one-half glass of wine (5 oz), or one shot of hard liquor (1 oz).  Do not use any products that contain nicotine or tobacco, such as cigarettes and e-cigarettes. If you need help quitting, ask your health care provider. Summary  Having a healthy lifestyle and getting preventive care can help to protect your health and wellness after age 37.  Screening and testing are the best way to find a health problem early and help you avoid having a fall. Early diagnosis and treatment give you the best chance for managing medical conditions that are more common for people who are older than age 17.  Falls are a major cause of broken bones and head injuries in people who are older than age 38. Take precautions to prevent a fall at home.  Work with your health care provider to learn what changes you can make to improve your health and wellness and to prevent falls. This information is not intended to replace advice given to you by your health care provider. Make sure you discuss any questions you have with your health care provider. Document Revised: 10/31/2018 Document Reviewed: 05/23/2017 Elsevier Patient Education  2020 [employer].

## 2020-07-14 NOTE — Assessment & Plan Note (Signed)
Manages with tums PRN.

## 2020-07-14 NOTE — Assessment & Plan Note (Signed)
Appreciate cards care. S/p cardioversion last week. Ongoing afib, rate controlled. Continue eliquis, now on metoprolol as well.

## 2020-07-14 NOTE — Assessment & Plan Note (Signed)
Preventative protocols reviewed and updated unless pt declined. Discussed healthy diet and lifestyle.  

## 2020-07-14 NOTE — Progress Notes (Signed)
Patient ID: Michael Decker, male    DOB: 06-08-1934, 86 y.o.   MRN: [id number]  This visit was conducted in person.  BP 130/64 (BP Location: Right Arm, Cuff Size: Large)   Pulse (!) 56   Temp 97.7 F (36.5 C) (Temporal)   Ht 5\' 10"  (1.778 m)   Wt 182 lb 1 oz (82.6 kg)   SpO2 96%   BMI 26.12 kg/m    CC: CPE/AMW Subjective:   HPI: Michael Decker is a 86 y.o. male presenting on 07/14/2020 for Medicare Wellness   Did not see health advisor this year.    Hearing Screening   125Hz  250Hz  500Hz  1000Hz  2000Hz  3000Hz  4000Hz  6000Hz  8000Hz   Right ear:           Left ear:           Comments: Pt states he cannot hear in right ear.  Wears hearing aid in left ear.  Wearing at today's visit.   Vision Screening Comments: Last eye exam, 04/2020.  [place] Office Visit from 07/14/2020 in [facility]  PHQ-2 Total Score 0      Fall Risk  07/14/2020 07/14/2019 02/20/2019 07/10/2018 06/29/2017  Falls in the past year? 1 0 0 0 No  Comment - - Emmi Telephone Survey: data to providers prior to load - -  Number falls in past yr: 1 - - - -  Injury with Fall? 1 - - - -   Recent new dx atrial fibrillation. Saw cardiology, now on eliquis 5mg  bid. S/p unsuccessful cardioversion last week - had really itchy rash to cardioversion pads (chest and back). Started on metoprolol 25mg  bid. Had echo done yesterday - results pending.   Ongoing orthostasis with some low BPs noted recently - 93/57 HR 82, later in the day 104/63, HR 55.  Labs from last month reviewed.   Preventative: COLONOSCOPY 08/2018TA, diverticulosis, int hem no f/u needed Henrene Pastor) Prostate -previously seenby Dr Alinda Money - h/o BPH. On avodart. Nocturia 4x, weak stream. Lung cancer screen - not eligible Flushot yearly COVID vaccine Moderna 08/2019, 09/2019, 04/2020 Pneumovax 2002and 2017, prevnar 2015 Tdap 2010  zostavax 2013 shingrix - discussed  Advanced planning - HCPOA and advanced directive scanned in  chart 06/2015. Wife Pat then Merideth Abbey then Ellard Artis are [facility]. Seat belt use discussed  Sunscreen use discussed. No changing moles on skin. Exsmoker(cigars)  Alcohol -one drink a day Dentist yearly  Eye exam yearly  Bowel - no constipation  Bladder - no incontinence   Lives with wife Occupation: prior worked with Clinical cytogeneticist as Recruitment consultant; volunteers at [employer] in [place], [place]: BS  Activity: limited gym activity ([employer]) due to pandemic  Diet: some water, fruits/vegetables daily     Relevant past medical, surgical, family and social history reviewed and updated as indicated. Interim medical history since our last visit reviewed. Allergies and medications reviewed and updated. Outpatient Medications Prior to Visit  Medication Sig Dispense Refill  . apixaban (ELIQUIS) 5 MG TABS tablet Take 1 tablet (5 mg total) by mouth 2 (two) times daily. 60 tablet 3  . calcium carbonate (TUMS - DOSED IN MG ELEMENTAL CALCIUM) 500 MG chewable tablet Chew 1 tablet by mouth as needed for indigestion or heartburn.    . Cyanocobalamin (B-12) 5000 MCG SUBL Place 5,000 mcg under the tongue daily.    Marland Kitchen dutasteride (AVODART) 0.5 MG capsule TAKE 1 CAPSULE BY MOUTH EVERY OTHER DAY (Patient taking differently: Take  0.5 mg by mouth every other day. TAKE 1 CAPSULE BY MOUTH EVERY OTHER DAY) 45 capsule 3  . Glucosamine-Chondroitin (OSTEO BI-FLEX REGULAR STRENGTH PO) Take 1 tablet by mouth at bedtime.    Issaih Kaus Docker Oil 500 MG CAPS Take 1 capsule by mouth daily.     . magnesium gluconate (MAGONATE) 500 MG tablet Take 500 mg by mouth daily.    . metoprolol tartrate (LOPRESSOR) 25 MG tablet Take 1 tablet (25 mg total) by mouth 2 (two) times daily. 60 tablet 6  . Multiple Vitamins-Minerals (MULTIVITAMIN WITH MINERALS) tablet Take 1 tablet by mouth daily.    . potassium gluconate 595 (99 K) MG TABS tablet Take 595 mg by mouth at bedtime.    Marland Kitchen telmisartan (MICARDIS) 80 MG tablet Take 1 tablet  (80 mg total) by mouth daily. 90 tablet 3   No facility-administered medications prior to visit.     Per HPI unless specifically indicated in ROS section below Review of Systems  Constitutional: Negative for activity change, appetite change, chills, fatigue, fever and unexpected weight change.  HENT: Negative for hearing loss.   Eyes: Negative for visual disturbance.  Respiratory: Negative for cough, chest tightness, shortness of breath and wheezing.   Cardiovascular: Negative for chest pain, palpitations and leg swelling.  Gastrointestinal: Negative for abdominal distention, abdominal pain, blood in stool, constipation, diarrhea, nausea and vomiting.  Genitourinary: Negative for difficulty urinating and hematuria.  Musculoskeletal: Negative for arthralgias, myalgias and neck pain.  Skin: Negative for rash.  Neurological: Negative for dizziness, seizures, syncope and headaches.  Hematological: Negative for adenopathy. Bruises/bleeds easily.  Psychiatric/Behavioral: Negative for dysphoric mood. The patient is not nervous/anxious.    Objective:  BP 130/64 (BP Location: Right Arm, Cuff Size: Large)   Pulse (!) 56   Temp 97.7 F (36.5 C) (Temporal)   Ht 5\' 10"  (1.778 m)   Wt 182 lb 1 oz (82.6 kg)   SpO2 96%   BMI 26.12 kg/m   Wt Readings from Last 3 Encounters:  07/14/20 182 lb 1 oz (82.6 kg)  07/09/20 175 lb (79.4 kg)  06/16/20 182 lb (82.6 kg)      Physical Exam Vitals and nursing note reviewed.  Constitutional:      General: He is not in acute distress.    Appearance: Normal appearance. He is well-developed and well-nourished. He is not ill-appearing.  HENT:     Head: Normocephalic and atraumatic.     Right Ear: Hearing, tympanic membrane, ear canal and external ear normal.     Left Ear: Hearing, tympanic membrane, ear canal and external ear normal.     Mouth/Throat:     Mouth: Oropharynx is clear and moist and mucous membranes are normal.     Pharynx: No posterior  oropharyngeal edema.  Eyes:     General: No scleral icterus.    Extraocular Movements: Extraocular movements intact and EOM normal.     Conjunctiva/sclera: Conjunctivae normal.     Pupils: Pupils are equal, round, and reactive to light.  Neck:     Thyroid: No thyroid mass or thyromegaly.     Vascular: No carotid bruit.  Cardiovascular:     Rate and Rhythm: Normal rate. Rhythm irregularly irregular.     Pulses: Normal pulses and intact distal pulses.          Radial pulses are 2+ on the right side and 2+ on the left side.     Heart sounds: Normal heart sounds. No murmur heard.   Pulmonary:  Effort: Pulmonary effort is normal. No respiratory distress.     Breath sounds: Normal breath sounds. No wheezing, rhonchi or rales.  Abdominal:     General: Abdomen is flat. Bowel sounds are normal. There is no distension.     Palpations: Abdomen is soft. There is no mass.     Tenderness: There is no abdominal tenderness. There is no guarding or rebound.     Hernia: No hernia is present.  Musculoskeletal:        General: No edema. Normal range of motion.     Cervical back: Normal range of motion and neck supple.     Right lower leg: No edema.     Left lower leg: No edema.  Lymphadenopathy:     Cervical: No cervical adenopathy.  Skin:    General: Skin is warm and dry.     Findings: No rash.  Neurological:     General: No focal deficit present.     Mental Status: He is alert and oriented to person, place, and time.     Comments:  CN grossly intact, station and gait intact Recall 3/3  Calculation 5/5 DLROW  Psychiatric:        Mood and Affect: Mood and affect and mood normal.        Behavior: Behavior normal.        Thought Content: Thought content normal.        Judgment: Judgment normal.       Results for orders placed or performed during the hospital encounter of 07/07/20  SARS CORONAVIRUS 2 (TAT 6-24 HRS) Nasopharyngeal Nasopharyngeal Swab   Specimen: Nasopharyngeal Swab   Result Value Ref Range   SARS Coronavirus 2 NEGATIVE NEGATIVE   Lab Results  Component Value Date   CHOL 159 05/25/2020   HDL 65.90 05/25/2020   LDLCALC 76 05/25/2020   TRIG 90.0 05/25/2020   CHOLHDL 2 05/25/2020   Lab Results  Component Value Date   PSA 0.56 07/07/2019   PSA 0.50 07/02/2018   PSA 0.57 06/28/2016   Assessment & Plan:  This visit occurred during the SARS-CoV-2 public health emergency.  Safety protocols were in place, including screening questions prior to the visit, additional usage of staff PPE, and extensive cleaning of exam room while observing appropriate contact time as indicated for disinfecting solutions.   Problem List Items Addressed This Visit    Persistent atrial fibrillation [facility])    Appreciate cards care. S/p cardioversion last week. Ongoing afib, rate controlled. Continue eliquis, now on metoprolol as well.       Medicare annual wellness visit, subsequent - Primary    I have personally reviewed the Medicare Annual Wellness questionnaire and have noted 1. The patient's medical and social history 2. Their use of alcohol, tobacco or illicit drugs 3. Their current medications and supplements 4. The patient's functional ability including ADL's, fall risks, home safety risks and hearing or visual impairment. Cognitive function has been assessed and addressed as indicated.  5. Diet and physical activity 6. Evidence for depression or mood disorders The patients weight, height, BMI have been recorded in the chart. I have made referrals, counseling and provided education to the patient based on review of the above and I have provided the pt with a written personalized care plan for preventive services. Provider list updated.. See scanned questionairre as needed for further documentation. Reviewed preventative protocols and updated unless pt declined.       HTN (hypertension)    Chronic, overall stable.  Continue current regimen. Will continue watching for  low blood pressure readings. If persistent, will recommend lowering telmisartan dose.       HLD (hyperlipidemia)    Chronic, stable only on krill oil - continue.  The ASCVD Risk score [name] DC Jr., et al., 2013) failed to calculate for the following reasons:   The 2013 ASCVD risk score is only valid for ages 30 to 67       Health maintenance examination    Preventative protocols reviewed and updated unless pt declined. Discussed healthy diet and lifestyle.       GERD (gastroesophageal reflux disease)    Manages with tums PRN.       Benign prostatic hyperplasia    Continues avodart QOD. Ongoing nocturia. No longer sees urology.           No orders of the defined types were placed in this encounter.  No orders of the defined types were placed in this encounter.   Patient instructions: If interested, check with pharmacy about new 2 shot shingles series (shingrix).  Return as needed or in 6 months for follow up visit  Good to see you today.  Follow up plan: Return in about 6 months (around 01/12/2021) for follow up visit.  Ria Bush, MD

## 2020-07-14 NOTE — Assessment & Plan Note (Signed)
Chronic, stable only on krill oil - continue.  The ASCVD Risk score [name] DC Jr., et al., 2013) failed to calculate for the following reasons:   The 2013 ASCVD risk score is only valid for ages 10 to 55

## 2020-07-15 LAB — ECHOCARDIOGRAM COMPLETE
AR max vel: 2.84 cm2
AV Area VTI: 2.58 cm2
AV Area mean vel: 2.67 cm2
AV Mean grad: 2.5 mmHg
AV Peak grad: 3.6 mmHg
Ao pk vel: 0.95 m/s
Calc EF: 50.2 %
S' Lateral: 3.9 cm
Single Plane A2C EF: 50.2 %
Single Plane A4C EF: 48 %

## 2020-07-28 ENCOUNTER — Encounter: Payer: Self-pay | Admitting: Cardiology

## 2020-07-28 ENCOUNTER — Ambulatory Visit: Payer: Medicare Other | Admitting: Cardiology

## 2020-07-28 ENCOUNTER — Other Ambulatory Visit: Payer: Self-pay

## 2020-07-28 VITALS — BP 130/80 | HR 81 | Ht 71.0 in | Wt 181.0 lb

## 2020-07-28 DIAGNOSIS — I10 Essential (primary) hypertension: Secondary | ICD-10-CM | POA: Diagnosis not present

## 2020-07-28 DIAGNOSIS — I4821 Permanent atrial fibrillation: Secondary | ICD-10-CM

## 2020-07-28 MED ORDER — METOPROLOL SUCCINATE ER 25 MG PO TB24: ORAL_TABLET | ORAL | 6 refills | Status: DC

## 2020-07-28 NOTE — Patient Instructions (Signed)
Medication Instructions:  - Your physician has recommended you make the following change in your medication:   1) STOP lopressor (metoprolol tartrate)  2) START toprol xl (metoprolol succinate) 25 mg- take 1 tablet by mouth once daily at bedtime  *If you need a refill on your cardiac medications before your next appointment, please call your pharmacy*   Lab Work: - none ordered  If you have labs (blood work) drawn today and your tests are completely normal, you will receive your results only by: Marland Kitchen MyChart Message (if you have MyChart) OR . A paper copy in the mail If you have any lab test that is abnormal or we need to change your treatment, we will call you to review the results.   Testing/Procedures: - none ordered   Follow-Up: At [facility], you and your health needs are our priority.  As part of our continuing mission to provide you with exceptional heart care, we have created designated Provider Care Teams.  These Care Teams include your primary Cardiologist (physician) and Advanced Practice Providers (APPs -  Physician Assistants and Nurse Practitioners) who all work together to provide you with the care you need, when you need it.  We recommend signing up for the patient portal called "MyChart".  Sign up information is provided on this After Visit Summary.  MyChart is used to connect with patients for Virtual Visits (Telemedicine).  Patients are able to view lab/test results, encounter notes, upcoming appointments, etc.  Non-urgent messages can be sent to your provider as well.   To learn more about what you can do with MyChart, go to [email or web address].    Your next appointment:   6 month(s)  The format for your next appointment:   In Person  Provider:   Lars Mage, MD   Other Instructions  Metoprolol Extended-Release Tablets What is this medicine? METOPROLOL (me TOE proe lole) is a beta blocker. It decreases the amount of work your heart has to do and  helps your heart beat regularly. It treats high blood pressure and/or prevent chest pain (also called angina). It also treats heart failure. This medicine may be used for other purposes; ask your health care provider or pharmacist if you have questions. COMMON BRAND NAME(S): toprol, Toprol XL What should I tell my health care provider before I take this medicine? They need to know if you have any of these conditions:  diabetes  heart or vessel disease like slow heart rate, worsening heart failure, heart block, sick sinus syndrome or Raynaud's disease  kidney disease  liver disease  lung or breathing disease, like asthma or emphysema  pheochromocytoma  thyroid disease  an unusual or allergic reaction to metoprolol, other beta-blockers, medicines, foods, dyes, or preservatives  pregnant or trying to get pregnant  breast-feeding How should I use this medicine? Take this drug by mouth. Take it as directed on the prescription label at the same time every day. Take it with food. You may cut the tablet in half if it is scored (has a line in the middle of it). This may help you swallow the tablet if the whole tablet is too big. Be sure to take both halves. Do not take just one-half of the tablet. Keep taking it unless your health care provider tells you to stop. Talk to your health care provider about the use of this drug in children. While it may be prescribed for children as young as 6 for selected conditions, precautions do apply. Overdosage: If you think  you have taken too much of this medicine contact a poison control center or emergency room at once. NOTE: This medicine is only for you. Do not share this medicine with others. What if I miss a dose? If you miss a dose, take it as soon as you can. If it is almost time for your next dose, take only that dose. Do not take double or extra doses. What may interact with this medicine? This medicine may interact with the following  medications:  certain medicines for blood pressure, heart disease, irregular heart beat  certain medicines for depression, like monoamine oxidase (MAO) inhibitors, fluoxetine, or paroxetine  clonidine  dobutamine  epinephrine  isoproterenol  reserpine This list may not describe all possible interactions. Give your health care provider a list of all the medicines, herbs, non-prescription drugs, or dietary supplements you use. Also tell them if you smoke, drink alcohol, or use illegal drugs. Some items may interact with your medicine. What should I watch for while using this medicine? Visit your doctor or health care professional for regular check ups. Contact your doctor right away if your symptoms worsen. Check your blood pressure and pulse rate regularly. Ask your health care professional what your blood pressure and pulse rate should be, and when you should contact them. You may get drowsy or dizzy. Do not drive, use machinery, or do anything that needs mental alertness until you know how this medicine affects you. Do not sit or stand up quickly, especially if you are an older patient. This reduces the risk of dizzy or fainting spells. Contact your doctor if these symptoms continue. Alcohol may interfere with the effect of this medicine. Avoid alcoholic drinks. This medicine may increase blood sugar. Ask your healthcare provider if changes in diet or medicines are needed if you have diabetes. What side effects may I notice from receiving this medicine? Side effects that you should report to your doctor or health care professional as soon as possible:  allergic reactions like skin rash, itching or hives  cold or numb hands or feet  depression  difficulty breathing  faint  fever with sore throat  irregular heartbeat, chest pain  rapid weight gain   signs and symptoms of high blood sugar such as being more thirsty or hungry or having to urinate more than normal. You may also  feel very tired or have blurry vision.  swollen legs or ankles Side effects that usually do not require medical attention (report to your doctor or health care professional if they continue or are bothersome):  anxiety or nervousness  change in sex drive or performance  dry skin  headache  nightmares or trouble sleeping  short term memory loss  stomach upset or diarrhea This list may not describe all possible side effects. Call your doctor for medical advice about side effects. You may report side effects to FDA at 1-800-FDA-1088. Where should I keep my medicine? Keep out of the reach of children and pets. Store at room temperature between 20 and 25 degrees C (68 and 77 degrees F). Throw away any unused drug after the expiration date. NOTE: This sheet is a summary. It may not cover all possible information. If you have questions about this medicine, talk to your doctor, pharmacist, or health care provider.  2020 Elsevier/Gold Standard (2019-02-20 18:23:00)

## 2020-07-28 NOTE — Progress Notes (Signed)
Electrophysiology Office Follow up Visit Note:    Date:  07/28/2020   ID:  Michael Decker, DOB 05/22/1934, MRN [id number]  PCP:  Michael Bush, MD  [place] Cardiologist:  No primary care provider on file.  [facility] Electrophysiologist:  Vickie Epley, MD    Interval History:    Michael Decker is a 87 y.o. male who presents for a follow up visit. They were last seen in clinic June 16, 2020.  At that appointment we discussed his atrial fibrillation whether not he was symptomatic.  He is a very active man so is a little unclear whether or not he was having symptoms related to his atrial fibrillation.  He exercises at the gym multiple times per week doing aerobic exercise and strength training.  We set him up for cardioversion which was performed on December 17 in an effort to restore normal rhythm to see if he felt better in normal rhythm.  Unfortunately within minutes of 3 cardioversions that day, he went back in atrial fibrillation and remains out of rhythm today.  He continues to be very active.    Past Medical History:  Diagnosis Date  . Arthritis    knee  . BPH (benign prostatic hypertrophy)    on avodart, released from urologist care  . Colon polyps 2013   TA 2013 Henrene Pastor)  . Diverticulosis   . GERD (gastroesophageal reflux disease)   . Hematospermia    s/p uro eval  . HLD (hyperlipidemia)    mild  . HTN (hypertension)   . Wears hearing aid in left ear     Past Surgical History:  Procedure Laterality Date  . BUNIONECTOMY Right 2014  . CARDIOVERSION N/A 07/09/2020   Procedure: CARDIOVERSION;  Surgeon: Wellington Hampshire, MD;  Location: [facility];  Service: Cardiovascular;  Laterality: N/A;  . CATARACT EXTRACTION W/PHACO Right 08/14/2018   Procedure: CATARACT EXTRACTION PHACO AND INTRAOCULAR LENS PLACEMENT ([place])  RIGHT;  Surgeon: Leandrew Koyanagi, MD;  Location: [place];  Service: Ophthalmology;  Laterality: Right;  requests arrival between  830 & 1030  . CATARACT EXTRACTION W/PHACO Left 09/11/2018   Procedure: CATARACT EXTRACTION PHACO AND INTRAOCULAR LENS PLACEMENT ([place])  LEFT;  Surgeon: Leandrew Koyanagi, MD;  Location: [place];  Service: Ophthalmology;  Laterality: Left;  Doesn't want to be early  . COLONOSCOPY  2008  . COLONOSCOPY  01/2012   TA x1, diverticulosis, rpt 5 yrs Henrene Pastor)  . COLONOSCOPY  02/2017   TA, diverticulosis, int hem no f/u needed Henrene Pastor)  . CYSTECTOMY    . TONSILLECTOMY  1944  . UPPER GI ENDOSCOPY      Current Medications: Current Meds  Medication Sig  . apixaban (ELIQUIS) 5 MG TABS tablet Take 1 tablet (5 mg total) by mouth 2 (two) times daily.  . calcium carbonate (TUMS - DOSED IN MG ELEMENTAL CALCIUM) 500 MG chewable tablet Chew 1 tablet by mouth as needed for indigestion or heartburn.  . Cyanocobalamin (B-12) 5000 MCG SUBL Place 5,000 mcg under the tongue daily.  Marland Kitchen dutasteride (AVODART) 0.5 MG capsule TAKE 1 CAPSULE BY MOUTH EVERY OTHER DAY  . Glucosamine-Chondroitin (OSTEO BI-FLEX REGULAR STRENGTH PO) Take 1 tablet by mouth at bedtime.  [name] Oil 500 MG CAPS Take 1 capsule by mouth daily.   . magnesium gluconate (MAGONATE) 500 MG tablet Take 500 mg by mouth daily.  . metoprolol succinate (TOPROL-XL) 25 MG 24 hr tablet Take 1 tablet (25 mg) by mouth once daily at  bedtime  . Multiple Vitamins-Minerals (MULTIVITAMIN WITH MINERALS) tablet Take 1 tablet by mouth daily.  . potassium gluconate 595 (99 K) MG TABS tablet Take 595 mg by mouth at bedtime.  Marland Kitchen telmisartan (MICARDIS) 80 MG tablet Take 1 tablet (80 mg total) by mouth daily.  . [DISCONTINUED] metoprolol tartrate (LOPRESSOR) 25 MG tablet Take 1 tablet (25 mg total) by mouth 2 (two) times daily.     Allergies:   Patient has no known allergies.   Social History   Socioeconomic History  . Marital status: Married    Spouse name: Not on file  . Number of children: Not on file  . Years of education: Not on file  . Highest  education level: Not on file  Occupational History  . Not on file  Tobacco Use  . Smoking status: Former Research scientist (life sciences)  . Smokeless tobacco: Never Used  . Tobacco comment: cigars back in the 60's  Vaping Use  . Vaping Use: Never used  Substance and Sexual Activity  . Alcohol use: Yes    Comment: occassionally-beer or wine  . Drug use: No  . Sexual activity: Yes    Partners: Female  Other Topics Concern  . Not on file  Social History Narrative   Lives with wife   Grown children   Occupation: prior worked with Clinical cytogeneticist as Recruitment consultant; volunteers at [employer] for [employer] in [place]   Edu: BS   Activity: works out at [employer] 3x/wk, on recumbent bike   Diet: some water, fruits/vegetables daily         Social Determinants of Radio broadcast assistant Strain: Not on file  Food Insecurity: No Food Insecurity  . Worried About Charity fundraiser in the Last Year: Never true  . Ran Out of Food in the Last Year: Never true  Transportation Needs: Not on file  Physical Activity: Not on file  Stress: Not on file  Social Connections: Not on file     Family History: The patient's family history includes Heart disease (age of onset: 42) in his brother; Sudden death (age of onset: 59) in his father. There is no history of Cancer, Diabetes, Stroke, Colon cancer, Esophageal cancer, or Stomach cancer.  ROS:   Please see the history of present illness.    All other systems reviewed and are negative.  EKGs/Labs/Other Studies Reviewed:    The following studies were reviewed today: Cardioversion records  EKG:  The ekg ordered today demonstrates atrial fibrillation with an average ventricular rate of 81 bpm with marked variability and AV conduction throughout the 6-second strip  Recent Labs: 05/25/2020: ALT 19; TSH 1.05 07/07/2020: BUN 22; Creatinine, Ser 1.14; Hemoglobin 15.2; Platelets 209; Potassium 4.5; Sodium 137  Recent Lipid Panel    Component Value Date/Time   CHOL 159  05/25/2020 1245   TRIG 90.0 05/25/2020 1245   TRIG 205 06/17/2013 0000   HDL 65.90 05/25/2020 1245   CHOLHDL 2 05/25/2020 1245   VLDL 18.0 05/25/2020 1245   LDLCALC 76 05/25/2020 1245   LDLCALC 82 06/17/2013 0000    Physical Exam:    VS:  BP 130/80 (BP Location: Left Arm, Patient Position: Sitting, Cuff Size: Normal)   Pulse 81   Ht 5\' 11"  (1.803 m)   Wt 181 lb (82.1 kg)   SpO2 97%   BMI 25.24 kg/m     Wt Readings from Last 3 Encounters:  07/28/20 181 lb (82.1 kg)  07/14/20 182 lb 1 oz (82.6 kg)  07/09/20 175 lb (79.4 kg)     GEN:  Well nourished, well developed in no acute distress HEENT: Normal NECK: No JVD; No carotid bruits LYMPHATICS: No lymphadenopathy CARDIAC: Irregularly irregular, no murmurs, rubs, gallops RESPIRATORY:  Clear to auscultation without rales, wheezing or rhonchi  ABDOMEN: Soft, non-tender, non-distended MUSCULOSKELETAL:  No edema; No deformity  SKIN: Warm and dry NEUROLOGIC:  Alert and oriented x 3 PSYCHIATRIC:  Normal affect   ASSESSMENT:    1. Atrial fibrillation, unspecified type (HCC)   2. Primary hypertension    PLAN:    In order of problems listed above:  1. Permanent atrial fibrillation Patient has permanent atrial fibrillation as of July 09, 2020.  Given an elevated CHA2DS2-VASc, he should continue Eliquis for stroke prophylaxis.  He is adequately rate controlled now but he does have periods of fatigue.  Given the degree of variability in his AV conduction I have recommended that we transition to a long-acting form of metoprolol once a day.  We will use Toprol-XL 25 mg once a day.  If he does not have any improvement in these scattered episodes of fatigue, he can decrease the dose to 12.5 mg daily.  2.  Hypertension Controlled on telmisartan.  We will plan on seeing him in 6 months.    Medication Adjustments/Labs and Tests Ordered: Current medicines are reviewed at length with the patient today.  Concerns regarding medicines  are outlined above.  Orders Placed This Encounter  Procedures  . EKG 12-Lead   Meds ordered this encounter  Medications  . metoprolol succinate (TOPROL-XL) 25 MG 24 hr tablet    Sig: Take 1 tablet (25 mg) by mouth once daily at bedtime    Dispense:  30 tablet    Refill:  6    Stopping metoprolol tartarte     Signed, Steffanie Dunn, MD, [facility]  07/28/2020 7:27 PM    Electrophysiology [facility]

## 2020-08-12 ENCOUNTER — Telehealth: Payer: Self-pay | Admitting: Cardiology

## 2020-08-12 DIAGNOSIS — I4821 Permanent atrial fibrillation: Secondary | ICD-10-CM

## 2020-08-12 MED ORDER — APIXABAN 5 MG PO TABS: 5.0000 mg | ORAL_TABLET | Freq: Two times a day (BID) | ORAL | 1 refills | Status: DC

## 2020-08-12 NOTE — Telephone Encounter (Signed)
[facility]

## 2020-08-12 NOTE — Telephone Encounter (Signed)
Prescription refill request for Eliquis received. Indication: a fib Last office visit:07/28/20 Scr: 1.14 Age:  87 Weight:  82kg

## 2020-08-12 NOTE — Telephone Encounter (Signed)
°*  STAT* If patient is at the pharmacy, call can be transferred to refill team.   1. Which medications need to be refilled? (please list name of each medication and dose if known)   eliquis 5 mg po BID  2. Which pharmacy/location (including street and city if local pharmacy) is medication to be sent to?  [facility]   3. Do they need a 30 day or 90 day supply? [place]

## 2020-08-12 NOTE — Telephone Encounter (Signed)
Refill Request.  

## 2020-08-16 DIAGNOSIS — M1712 Unilateral primary osteoarthritis, left knee: Secondary | ICD-10-CM | POA: Diagnosis not present

## 2020-08-21 ENCOUNTER — Other Ambulatory Visit: Payer: Self-pay | Admitting: Family Medicine

## 2020-08-23 ENCOUNTER — Other Ambulatory Visit: Payer: Self-pay | Admitting: Family Medicine

## 2020-08-23 MED ORDER — DUTASTERIDE 0.5 MG PO CAPS: ORAL_CAPSULE | ORAL | 1 refills | Status: DC

## 2020-08-23 NOTE — Telephone Encounter (Signed)
Pharmacy requests refill on: Dutasteride 0.5 mg  LAST REFILL: 07/14/2019 (Q-45, R-3) LAST OV: 07/14/2020 NEXT OV: 12/28/2020 PHARMACY: [id number] [place], [place]

## 2020-08-24 ENCOUNTER — Other Ambulatory Visit: Payer: Self-pay

## 2020-08-24 DIAGNOSIS — I4821 Permanent atrial fibrillation: Secondary | ICD-10-CM

## 2020-08-24 MED ORDER — APIXABAN 5 MG PO TABS: 5.0000 mg | ORAL_TABLET | Freq: Two times a day (BID) | ORAL | 1 refills | Status: DC

## 2020-08-24 MED ORDER — METOPROLOL SUCCINATE ER 25 MG PO TB24: ORAL_TABLET | ORAL | 1 refills | Status: DC

## 2020-08-24 NOTE — Telephone Encounter (Signed)
*  STAT* If patient is at the pharmacy, call can be transferred to refill team.   1. Which medications need to be refilled? (please list name of each medication and dose if known) Metoprolol, Eliquis  2. Which pharmacy/location (including street and city if local pharmacy) is medication to be sent to? [facility]  3. Do they need a 30 day or 90 day supply? [place]

## 2020-08-24 NOTE — Telephone Encounter (Signed)
Refill request

## 2020-08-24 NOTE — Telephone Encounter (Signed)
Pharmacy requests refill on: Telmisartan 80 mg   LAST REFILL: 07/14/2019 (Q-90, R-3) LAST OV: 07/14/2020 NEXT OV: 12/28/2020 PHARMACY: [id number] [place], [place]

## 2020-08-25 ENCOUNTER — Encounter: Payer: Self-pay | Admitting: Family Medicine

## 2020-09-17 DIAGNOSIS — M1712 Unilateral primary osteoarthritis, left knee: Secondary | ICD-10-CM | POA: Diagnosis not present

## 2020-09-23 ENCOUNTER — Telehealth: Payer: Self-pay | Admitting: Cardiology

## 2020-09-23 NOTE — Telephone Encounter (Signed)
I spoke with the patient. He advised that he has been having a rash from his groin up to his shoulder and some under his arm pits every night since Monday night.  The rash "moves around" and is itchy.  He states he has been using his wife triamcinolone cream every night and this will help take the itch away so he can sleep.  In the mornings, he states the rash is gone, but has reappeared every night since Monday night ~ 9pm- 10pm.  He states that eliquis and metoprolol succinate are his newest meds, but these were started ~ November 2021.  Nigh time eliquis is taken around supper time. Metoprolol succinate is taken once daily ~ 11:00 pm.   I have advised the patient this sounds a little odd to be a medication reaction. He denies any change in soaps/ laundry detergent.   I advised him to continue his current medications at this time, but I will forward a message to our pharmacy team/ Dr. Quentin Ore for further feedback and recommendations.  The patient voices understanding and is agreeable.

## 2020-09-23 NOTE — Telephone Encounter (Signed)
Pt c/o medication issue:  1. Name of Medication:  Cardiac meds Eliquis added in December and metoprolol changed in January   2. How are you currently taking this medication (dosage and times per day)?    3. Are you having a reaction (difficulty breathing--STAT)?  Rash   4. What is your medication issue? Patient not sure if new rash is caused by medications.  Patient states nothing at home has changed .  Rash changes locations every night moving from arm to groin to abdomen . Patient reports rash starts out looking like a pimple and then spreads and whelps .     Patient states no one else at home is having this issue.

## 2020-09-23 NOTE — Telephone Encounter (Signed)
Metoprolol has a reported incidence of rash in 2-5% of patients compared to Eliquis which has an incidence of < 1%. Also unsure if this is drug-induced, changing 1 med at a time would be the best way to tell. Would likely try changing his beta blocker therapy first although it does look like he previously took metoprolol from 2013-2016. Would see if pt recalls a similar rash during that time. Could try carvedilol if pt would like - equivalent dose would be 3.125mg  BID.   If his rash doesn't improve in a few weeks, could change Eliquis to Pradaxa (different mechanism of action so may be tolerated better, also better safety profile than Xarelto in phase 4 studies).  Will also await input from MD.

## 2020-09-24 DIAGNOSIS — M1712 Unilateral primary osteoarthritis, left knee: Secondary | ICD-10-CM | POA: Diagnosis not present

## 2020-09-27 MED ORDER — CARVEDILOL 3.125 MG PO TABS: 3.1250 mg | ORAL_TABLET | Freq: Two times a day (BID) | ORAL | 1 refills | Status: DC

## 2020-09-27 NOTE — Addendum Note (Signed)
Addended by: Lamar Laundry on: 09/27/2020 08:35 AM   Modules accepted: Orders

## 2020-09-27 NOTE — Telephone Encounter (Signed)
Vickie Epley, MD  Supple, Harlon Flor, RPH-CPP; Damian Leavell, RN 15 hours ago (4:42 PM)     Frazier Butt and Sonia Baller,  His symptoms don't sound like a drug rash to me but OK to transition to coreg to see if that helps.  Let's do the carvedilol 3.125mg  PO BID.  Thanks,  CL

## 2020-09-27 NOTE — Telephone Encounter (Signed)
Spoke with the patient. Patient sts that he has held metoprolol for 3 days and his rash has resolved.  Patient made aware of Dr. Mardene Speak response and recommendation. He is agreeable with transitioning from metoprolol to carvedilol 3.125 mg bid. Rx sent to the patients pharmacy. Updated the pt allergy list. Advised the patient that he is to contact the office if symptoms reoccur. Patient verbalized understanding and voiced appreciation for the call.

## 2020-10-04 ENCOUNTER — Telehealth: Payer: Self-pay | Admitting: Cardiology

## 2020-10-04 DIAGNOSIS — M1712 Unilateral primary osteoarthritis, left knee: Secondary | ICD-10-CM | POA: Diagnosis not present

## 2020-10-04 NOTE — Telephone Encounter (Signed)
mychart message sent

## 2020-10-04 NOTE — Telephone Encounter (Signed)
Pt c/o medication issue:  1. Name of Medication: carvedilol   2. How are you currently taking this medication (dosage and times per day)? Have not taken medication at all and will wait until after he discusses with the doctor   3. Are you having a reaction (difficulty breathing--STAT)?   4. What is your medication issue? Wants to make sure it is neccessary now that he has stopped over medications and BP has been around (135-140/70)  Patient is very persistent that he wants to talk with only Dr. Quentin Ore, either today before 1 or after 10/11/20

## 2020-10-06 NOTE — Telephone Encounter (Signed)
Sent MyChart message advising Pt can start carvedilol or wait to discuss further at upcoming appointment with Dr. Quentin Ore.  Pt sees his PCP for this issue on 10/12/20 And has follow up with Dr. Quentin Ore on 10/27/20.  Await further needs.

## 2020-10-11 ENCOUNTER — Other Ambulatory Visit: Payer: Self-pay

## 2020-10-12 ENCOUNTER — Ambulatory Visit (INDEPENDENT_AMBULATORY_CARE_PROVIDER_SITE_OTHER): Payer: Medicare Other | Admitting: Family Medicine

## 2020-10-12 ENCOUNTER — Telehealth: Payer: Self-pay

## 2020-10-12 ENCOUNTER — Encounter: Payer: Self-pay | Admitting: Family Medicine

## 2020-10-12 DIAGNOSIS — I4819 Other persistent atrial fibrillation: Secondary | ICD-10-CM

## 2020-10-12 NOTE — Patient Instructions (Addendum)
You are doing well today  You are staying in afib but heart rate is good.  Worth a trial of carvedilol to see how you do with it.

## 2020-10-12 NOTE — Chronic Care Management (AMB) (Addendum)
Chronic Care Management Pharmacy Assistant   Name: Michael Decker  MRN: [id number] DOB: [birth year]  Reason for Encounter: Disease State- Hypertension   Conditions to be addressed/monitored: HTN  Recent office visits:  10/12/20- Dr. Danise Mina- PCP  07/28/20- Dr. Danise Mina  06/02/20- Dr. Danise Mina- telephone encounter- Started Eliquis, discontinued aspirin due to Afib 05/25/20- Dr. Danise Mina  Recent consult visits:  10/04/20 [place]- Synvisc injection 09/24/20 Damaris Hippo- PA- Orthopedics- Synvisc injection 09/17/20 Damaris Hippo- PA- Orthopedics- Synvisc injection 08/16/20 Damaris Hippo- PA 07/28/20- Dr. Lars Mage- Cardiology- changed metoprolol tartrate 25 mg twice daily to metoprolol succinate 25 mg once at bedtime. 07/13/20- Dr. Kathlyn [place]- Cardiology 06/16/20- Dr. Lars Mage- Cardiology 05/07/20- Dr. Leandrew Koyanagi- Ophthalmology 04/21/20- Dr. Milagros Evener- Orthopedics 04/19/20[facility]:  Medication Reconciliation was completed by comparing discharge summary, patient's EMR and Pharmacy list, and upon discussion with patient.  Admitted to the hospital on 06/03/20 due to contusion of ribs. Discharge date was 06/03/20. Discharged from [facility].    New?Medications Started at [facility] Discharge:?? -started Hydrocodone (short term) due to rib contusion  Medication Changes at Hospital Discharge: -N/A  Medications Discontinued at Hospital Discharge: -N/A  Medications that remain the same after Hospital Discharge:??  -All other medications will remain the same.    Medications: Outpatient Encounter Medications as of 10/12/2020  Medication Sig   apixaban (ELIQUIS) 5 MG TABS tablet Take 1 tablet (5 mg total) by mouth 2 (two) times daily.   calcium carbonate (TUMS - DOSED IN MG ELEMENTAL CALCIUM) 500 MG chewable tablet Chew 1 tablet by mouth as needed for indigestion or heartburn.   carvedilol  (COREG) 3.125 MG tablet Take 1 tablet (3.125 mg total) by mouth 2 (two) times daily with a meal.   Cyanocobalamin (B-12) 5000 MCG SUBL Place 5,000 mcg under the tongue daily.   dutasteride (AVODART) 0.5 MG capsule TAKE 1 CAPSULE BY MOUTH EVERY OTHER DAY   Glucosamine-Chondroitin (OSTEO BI-FLEX REGULAR STRENGTH PO) Take 1 tablet by mouth at bedtime.   Krill Oil 500 MG CAPS Take 1 capsule by mouth daily.    magnesium gluconate (MAGONATE) 500 MG tablet Take 500 mg by mouth daily.   Multiple Vitamins-Minerals (MULTIVITAMIN WITH MINERALS) tablet Take 1 tablet by mouth daily.   potassium gluconate 595 (99 K) MG TABS tablet Take 595 mg by mouth at bedtime.   telmisartan (MICARDIS) 80 MG tablet TAKE 1 TABLET BY MOUTH EVERY DAY   No facility-administered encounter medications on file as of 10/12/2020.     Recent Office Vitals: BP Readings from Last 3 Encounters:  10/12/20 128/76  07/28/20 130/80  07/14/20 130/64   Pulse Readings from Last 3 Encounters:  10/12/20 71  07/28/20 81  07/14/20 (!) 56    Wt Readings from Last 3 Encounters:  10/12/20 184 lb 2 oz (83.5 kg)  07/28/20 181 lb (82.1 kg)  07/14/20 182 lb 1 oz (82.6 kg)     Kidney Function Lab Results  Component Value Date/Time   CREATININE 1.14 07/07/2020 09:57 AM   CREATININE 1.20 05/25/2020 12:45 PM   CREATININE 1.24 06/17/2013 12:00 AM   GFR 55.68 (L) 05/25/2020 12:45 PM   GFRNONAA >60 07/07/2020 09:57 AM    BMP Latest Ref Rng & Units 07/07/2020 05/25/2020 07/07/2019  Glucose 70 - 99 mg/dL 88 83 91  BUN 8 - 23 mg/dL 22 23 20   Creatinine 0.61 - 1.24 mg/dL 1.14 1.20 1.18  Sodium 135 - 145 mmol/L 137  135 140  Potassium 3.5 - 5.1 mmol/L 4.5 4.3 4.4  Chloride 98 - 111 mmol/L 101 99 104  CO2 22 - 32 mmol/L 29 30 30   Calcium 8.9 - 10.3 mg/dL 9.3 8.8 9.1    Current antihypertensive regimen:  Carvedilol 3.125 mg - take 1 tablet twice daily with meal (Not taking due to rash previously wants to discuss with cardiology before  taking) Telmisartan 80 mg - 1 tablet daily -Patient verbally confirms he is taking  How often are you checking your Blood Pressure? 1-2x per week  he checks his blood pressure in the middle of the day after taking his medication.  Current home BP readings:   DATE:             BP               PULSE  -  130/72  54  -  135/74  53  -  139/75  53  Wrist or arm cuff: Arm Caffeine intake: 2 cups of coffee in the morning Salt intake: Does not add salt to food OTC medications including pseudoephedrine or NSAIDs? no  Any readings above 180/120? No If yes any symptoms of hypertensive emergency? patient denies any symptoms of high blood pressure   What recent interventions/DTPs have been made by any provider to improve Blood Pressure control since last CPP Visit:  transitioned from metoprolol to carvedilol 3.125 mg bid (but patient has not started due to concerns of side effects. He would like to discuss with cardiology prior to starting.  Any recent hospitalizations or ED visits since last visit with CPP? Yes  What diet changes have been made to improve Blood Pressure Control?  No changes in diet  What exercise is being done to improve your Blood Pressure Control?  No changes, goes to gym 3 times a week  Adherence Review: Is the patient currently on ACE/ARB medication? Yes Does the patient have >5 day gap between last estimated fill dates? CPP to review   Star Rating Drugs:  Medication:  Last Fill: Day Supply Telmisartan 80 mg 08/24/20  90  Next CCM visit Sept 2022. He is aware to have all of his medications, supplements and any blood pressure readings available to discuss with Sharyn Lull.    Follow-Up:  Pharmacist Review and Scheduled Follow-Up With Clinical Pharmacist  Debbora Dus, CPP notified  Margaretmary Dys, [place] [phone]  Reviewed chart, cardiology office okay with pt holding carvedilol and monitoring BP and HR in the meantime. He has  follow up 10/27/20 with cardiology.  Debbora Dus, PharmD Clinical Pharmacist [facility] [phone]

## 2020-10-12 NOTE — Progress Notes (Signed)
Patient ID: [name], male    DOB: 05/13/1934, 87 y.o.   MRN: [id number]  This visit was conducted in person.  BP 128/76 (BP Location: Left Arm, Patient Position: Sitting, Cuff Size: Normal)   Pulse 71   Temp (!) 96.9 F (36.1 C) (Temporal)   Ht 5\' 10"  (1.778 m)   Wt 184 lb 2 oz (83.5 kg)   SpO2 96%   BMI 26.42 kg/m    CC: follow up visit, several questions Subjective:   HPI: Michael Decker is a 87 y.o. male presenting on 10/12/2020 for Follow-up (Discuss cardiac concerns)   New atrial fibrillation s/p unsuccessful cardioversion 06/2020. Fortunately seems to tolerate atrial fibrillation well, continues eliquis 5mg  bid for stroke ppx. Latest saw cardiology Michael Decker) 07/2020 - now on Toprol XL 25mg  daily.   He's felt well throughout all of this. Metoprolol XL 25mg  caused rash to body that improved when he stopped. Cardiology suggested changing b-blocker to carvedilol 3.125mg  bid. Hasn't tried carvedilol yet.   Brings BP log showing average BP 116-128/68-88, HR 66-75.   Echocardiogram 06/2020 showing LVEF 45-50%, RA moderately dilated, LA mildly dilated, mild-mod MVR, mild-mod aortic sclerosis without AS. Also noted mild dilation of ascending aorta to 63mm.   Has received L knee synvisc injections x3 through [facility].      Relevant past medical, surgical, family and social history reviewed and updated as indicated. Interim medical history since our last visit reviewed. Allergies and medications reviewed and updated. Outpatient Medications Prior to Visit  Medication Sig Dispense Refill  . apixaban (ELIQUIS) 5 MG TABS tablet Take 1 tablet (5 mg total) by mouth 2 (two) times daily. 90 tablet 1  . calcium carbonate (TUMS - DOSED IN MG ELEMENTAL CALCIUM) 500 MG chewable tablet Chew 1 tablet by mouth as needed for indigestion or heartburn.    . carvedilol (COREG) 3.125 MG tablet Take 1 tablet (3.125 mg total) by mouth 2 (two) times daily with a meal. 60 tablet 1  .  Cyanocobalamin (B-12) 5000 MCG SUBL Place 5,000 mcg under the tongue daily.    Marland Kitchen dutasteride (AVODART) 0.5 MG capsule TAKE 1 CAPSULE BY MOUTH EVERY OTHER DAY 45 capsule 1  . Glucosamine-Chondroitin (OSTEO BI-FLEX REGULAR STRENGTH PO) Take 1 tablet by mouth at bedtime.    Michael Decker 500 MG CAPS Take 1 capsule by mouth daily.     . magnesium gluconate (MAGONATE) 500 MG tablet Take 500 mg by mouth daily.    . Multiple Vitamins-Minerals (MULTIVITAMIN WITH MINERALS) tablet Take 1 tablet by mouth daily.    . potassium gluconate 595 (99 K) MG TABS tablet Take 595 mg by mouth at bedtime.    Marland Kitchen telmisartan (MICARDIS) 80 MG tablet TAKE 1 TABLET BY MOUTH EVERY DAY 90 tablet 1   No facility-administered medications prior to visit.     Per HPI unless specifically indicated in ROS section below Review of Systems Objective:  BP 128/76 (BP Location: Left Arm, Patient Position: Sitting, Cuff Size: Normal)   Pulse 71   Temp (!) 96.9 F (36.1 C) (Temporal)   Ht 5\' 10"  (1.778 m)   Wt 184 lb 2 oz (83.5 kg)   SpO2 96%   BMI 26.42 kg/m   Wt Readings from Last 3 Encounters:  10/12/20 184 lb 2 oz (83.5 kg)  07/28/20 181 lb (82.1 kg)  07/14/20 182 lb 1 oz (82.6 kg)      Physical Exam Vitals and nursing note reviewed.  Constitutional:  Appearance: Normal appearance. He is not ill-appearing.  Cardiovascular:     Rate and Rhythm: Normal rate. Rhythm irregularly irregular.     Pulses: Normal pulses.     Heart sounds: Normal heart sounds. No murmur heard.   Pulmonary:     Effort: Pulmonary effort is normal. No respiratory distress.     Breath sounds: Normal breath sounds. No wheezing, rhonchi or rales.  Neurological:     Mental Status: He is alert.  Psychiatric:        Mood and Affect: Mood normal.        Behavior: Behavior normal.       Results for orders placed or performed in visit on 07/13/20  ECHOCARDIOGRAM COMPLETE  Result Value Ref Range   AR max vel 2.84 cm2   AV Peak grad 3.6  mmHg   Ao pk vel 0.95 m/s   S' Lateral 3.90 cm   AV Area VTI 2.58 cm2   AV Mean grad 2.5 mmHg   Single Plane A4C EF 48.0 %   Single Plane A2C EF 50.2 %   Calc EF 50.2 %   AV Area mean vel 2.67 cm2   Assessment & Plan:  This visit occurred during the SARS-CoV-2 public health emergency.  Safety protocols were in place, including screening questions prior to the visit, additional usage of staff PPE, and extensive cleaning of exam room while observing appropriate contact time as indicated for disinfecting solutions.   Problem List Items Addressed This Visit    Persistent atrial fibrillation ([place])    Stays irregular.  Answered questions. Suggested he start at once daily carvedilol to ensure tolerating well then increase to bid. To touch base with EP at f/u visit in a few weeks           No orders of the defined types were placed in this encounter.  No orders of the defined types were placed in this encounter.   Patient Instructions  You are doing well today  You are staying in afib but heart rate is good.  Worth a trial of carvedilol to see how you do with it.    Follow up plan: Return if symptoms worsen or fail to improve.  Ria Bush, MD

## 2020-10-12 NOTE — Assessment & Plan Note (Signed)
Stays irregular.  Answered questions. Suggested he start at once daily carvedilol to ensure tolerating well then increase to bid. To touch base with EP at f/u visit in a few weeks

## 2020-10-27 ENCOUNTER — Ambulatory Visit: Payer: Medicare Other | Admitting: Cardiology

## 2020-10-27 ENCOUNTER — Other Ambulatory Visit: Payer: Self-pay

## 2020-10-27 ENCOUNTER — Encounter: Payer: Self-pay | Admitting: Cardiology

## 2020-10-27 VITALS — BP 132/72 | HR 72 | Ht 71.0 in | Wt 183.0 lb

## 2020-10-27 DIAGNOSIS — I5022 Chronic systolic (congestive) heart failure: Secondary | ICD-10-CM

## 2020-10-27 DIAGNOSIS — I4821 Permanent atrial fibrillation: Secondary | ICD-10-CM

## 2020-10-27 NOTE — Progress Notes (Signed)
Electrophysiology Office Follow up Visit Note:    Date:  10/27/2020   ID:  Michael Decker, DOB 1934-07-01, MRN [id number]  PCP:  Ria Bush, MD  [place] Cardiologist:  No primary care provider on file.  [facility] Electrophysiologist:  Vickie Epley, MD    Interval History:    Michael Decker is a 87 y.o. male who presents for a follow up visit.  I last saw the patient July 28, 2020.  He is in clinic today with his wife.  He has been doing quite well.  He is extremely active and has no symptoms of his left ventricular dysfunction or atrial fibrillation.    Past Medical History:  Diagnosis Date  . Arthritis    knee  . BPH (benign prostatic hypertrophy)    on avodart, released from urologist care  . Colon polyps 2013   TA 2013 Henrene Pastor)  . Diverticulosis   . GERD (gastroesophageal reflux disease)   . Hematospermia    s/p uro eval  . HLD (hyperlipidemia)    mild  . HTN (hypertension)   . Wears hearing aid in left ear     Past Surgical History:  Procedure Laterality Date  . BUNIONECTOMY Right 2014  . CARDIOVERSION N/A 07/09/2020   Procedure: CARDIOVERSION;  Surgeon: Wellington Hampshire, MD;  Location: [facility];  Service: Cardiovascular;  Laterality: N/A;  . CATARACT EXTRACTION W/PHACO Right 08/14/2018   Procedure: CATARACT EXTRACTION PHACO AND INTRAOCULAR LENS PLACEMENT ([place])  RIGHT;  Surgeon: Leandrew Koyanagi, MD;  Location: [place];  Service: Ophthalmology;  Laterality: Right;  requests arrival between 830 & 1030  . CATARACT EXTRACTION W/PHACO Left 09/11/2018   Procedure: CATARACT EXTRACTION PHACO AND INTRAOCULAR LENS PLACEMENT ([place])  LEFT;  Surgeon: Leandrew Koyanagi, MD;  Location: [place];  Service: Ophthalmology;  Laterality: Left;  Doesn't want to be early  . COLONOSCOPY  2008  . COLONOSCOPY  01/2012   TA x1, diverticulosis, rpt 5 yrs Henrene Pastor)  . COLONOSCOPY  02/2017   TA, diverticulosis, int hem no f/u needed Henrene Pastor)  .  CYSTECTOMY    . TONSILLECTOMY  1944  . UPPER GI ENDOSCOPY      Current Medications: Current Meds  Medication Sig  . apixaban (ELIQUIS) 5 MG TABS tablet Take 1 tablet (5 mg total) by mouth 2 (two) times daily.  . calcium carbonate (TUMS - DOSED IN MG ELEMENTAL CALCIUM) 500 MG chewable tablet Chew 1 tablet by mouth as needed for indigestion or heartburn.  . carvedilol (COREG) 3.125 MG tablet Take 3.125 mg by mouth daily.  . Cyanocobalamin (B-12) 5000 MCG SUBL Place 5,000 mcg under the tongue as needed.  . dutasteride (AVODART) 0.5 MG capsule TAKE 1 CAPSULE BY MOUTH EVERY OTHER DAY  . Glucosamine-Chondroitin (OSTEO BI-FLEX REGULAR STRENGTH PO) Take 1 tablet by mouth at bedtime.  [name] Oil 500 MG CAPS Take 1 capsule by mouth daily.   . magnesium gluconate (MAGONATE) 500 MG tablet Take 500 mg by mouth daily.  . Multiple Vitamins-Minerals (MULTIVITAMIN WITH MINERALS) tablet Take 1 tablet by mouth daily.  . potassium gluconate 595 (99 K) MG TABS tablet Take 595 mg by mouth at bedtime.  Marland Kitchen telmisartan (MICARDIS) 80 MG tablet TAKE 1 TABLET BY MOUTH EVERY DAY     Allergies:   Metoprolol   Social History   Socioeconomic History  . Marital status: Married    Spouse name: Not on file  . Number of children: Not on file  .  Years of education: Not on file  . Highest education level: Not on file  Occupational History  . Not on file  Tobacco Use  . Smoking status: Former Research scientist (life sciences)  . Smokeless tobacco: Never Used  . Tobacco comment: cigars back in the 60's  Vaping Use  . Vaping Use: Never used  Substance and Sexual Activity  . Alcohol use: Yes    Comment: occassionally-beer or wine  . Drug use: No  . Sexual activity: Yes    Partners: Female  Other Topics Concern  . Not on file  Social History Narrative   Lives with wife   Grown children   Occupation: prior worked with Clinical cytogeneticist as Recruitment consultant; volunteers at [employer] for [employer] in [place]   Edu: BS   Activity: works out at  [employer] 3x/wk, on recumbent bike   Diet: some water, fruits/vegetables daily         Social Determinants of Radio broadcast assistant Strain: Not on file  Food Insecurity: No Food Insecurity  . Worried About Charity fundraiser in the Last Year: Never true  . Ran Out of Food in the Last Year: Never true  Transportation Needs: Not on file  Physical Activity: Not on file  Stress: Not on file  Social Connections: Not on file     Family History: The patient's family history includes Heart disease (age of onset: 60) in his brother; Sudden death (age of onset: 40) in his father. There is no history of Cancer, Diabetes, Stroke, Colon cancer, Esophageal cancer, or Stomach cancer.  ROS:   Please see the history of present illness.    All other systems reviewed and are negative.  EKGs/Labs/Other Studies Reviewed:    The following studies were reviewed today:   Recent Labs: 05/25/2020: ALT 19; TSH 1.05 07/07/2020: BUN 22; Creatinine, Ser 1.14; Hemoglobin 15.2; Platelets 209; Potassium 4.5; Sodium 137  Recent Lipid Panel    Component Value Date/Time   CHOL 159 05/25/2020 1245   TRIG 90.0 05/25/2020 1245   TRIG 205 06/17/2013 0000   HDL 65.90 05/25/2020 1245   CHOLHDL 2 05/25/2020 1245   VLDL 18.0 05/25/2020 1245   LDLCALC 76 05/25/2020 1245   LDLCALC 82 06/17/2013 0000    Physical Exam:    VS:  BP 132/72 (BP Location: Left Arm, Patient Position: Sitting, Cuff Size: Normal)   Pulse 72   Ht 5\' 11"  (1.803 m)   Wt 183 lb (83 kg)   SpO2 97%   BMI 25.52 kg/m     Wt Readings from Last 3 Encounters:  10/27/20 183 lb (83 kg)  10/12/20 184 lb 2 oz (83.5 kg)  07/28/20 181 lb (82.1 kg)     GEN:  Well nourished, well developed in no acute distress.  Appears younger than stated age. HEENT: Normal NECK: No JVD; No carotid bruits LYMPHATICS: No lymphadenopathy CARDIAC: Irregularly irregular, no murmurs, rubs, gallops RESPIRATORY:  Clear to auscultation without rales, wheezing or  rhonchi  ABDOMEN: Soft, non-tender, non-distended MUSCULOSKELETAL:  No edema; No deformity  SKIN: Warm and dry NEUROLOGIC:  Alert and oriented x 3 PSYCHIATRIC:  Normal affect   ASSESSMENT:    1. Permanent atrial fibrillation ([place])   2. Chronic systolic heart failure (HCC)    PLAN:    In order of problems listed above:  1. Permanent atrial fibrillation Rate controlled.  On Eliquis for stroke prophylaxis.  2.  Chronic systolic heart failure NYHA class I.  Warm and dry.  Left  ventricular function very mildly decreased at 45% on December 2021 echo. On telmisartan and carvedilol.  A long discussion today about his carvedilol given his previous intolerance to metoprolol.  He is understandably hesitant to use a lot of cardiac medications given the potential for off target effects.  He will continue to use low-dose Coreg for now but I have advised him that if he develops off target effects with this he should stop the medication and we will plan to just keep him on his telmisartan.  Follow-up 6 months.  Total time spent with patient today 45 minutes. This includes reviewing records, evaluating the patient and coordinating care.   Medication Adjustments/Labs and Tests Ordered: Current medicines are reviewed at length with the patient today.  Concerns regarding medicines are outlined above.  No orders of the defined types were placed in this encounter.  No orders of the defined types were placed in this encounter.    Signed, Lars Mage, MD, [facility], [facility] 10/27/2020 10:10 PM    Electrophysiology [facility]

## 2020-10-27 NOTE — Patient Instructions (Addendum)
Medication Instructions: Your physician recommends that you continue on your current medications as directed. Please refer to the Current Medication list given to you today.  *If you need a refill on your cardiac medications before your next appointment, please call your pharmacy*   Lab Work: None ordered.  If you have labs (blood work) drawn today and your tests are completely normal, you will receive your results only by: Marland Kitchen MyChart Message (if you have MyChart) OR . A paper copy in the mail If you have any lab test that is abnormal or we need to change your treatment, we will call you to review the results.   Testing/Procedures: None ordered.    Follow-Up: At [facility], you and your health needs are our priority.  As part of our continuing mission to provide you with exceptional heart care, we have created designated Provider Care Teams.  These Care Teams include your primary Cardiologist (physician) and Advanced Practice Providers (APPs -  Physician Assistants and Nurse Practitioners) who all work together to provide you with the care you need, when you need it.  We recommend signing up for the patient portal called "MyChart".  Sign up information is provided on this After Visit Summary.  MyChart is used to connect with patients for Virtual Visits (Telemedicine).  Patients are able to view lab/test results, encounter notes, upcoming appointments, etc.  Non-urgent messages can be sent to your provider as well.   To learn more about what you can do with MyChart, go to [email or web address].    Your next appointment:   6 month(s)  The format for your next appointment:   In Person  Provider:   Lars Mage

## 2020-10-29 ENCOUNTER — Ambulatory Visit (INDEPENDENT_AMBULATORY_CARE_PROVIDER_SITE_OTHER): Payer: Medicare Other | Admitting: Family Medicine

## 2020-10-29 ENCOUNTER — Other Ambulatory Visit: Payer: Self-pay

## 2020-10-29 ENCOUNTER — Encounter: Payer: Self-pay | Admitting: Family Medicine

## 2020-10-29 ENCOUNTER — Ambulatory Visit: Payer: Medicare Other | Admitting: Family Medicine

## 2020-10-29 DIAGNOSIS — R2232 Localized swelling, mass and lump, left upper limb: Secondary | ICD-10-CM | POA: Insufficient documentation

## 2020-10-29 NOTE — Progress Notes (Signed)
Patient ID: Michael Decker, male    DOB: Dec 16, 1933, 87 y.o.   MRN: [id number]  This visit was conducted in person.  BP 140/72   Pulse (!) 56   Temp 97.9 F (36.6 C) (Temporal)   Ht 5\' 11"  (1.803 m)   Wt 184 lb 4 oz (83.6 kg)   SpO2 97%   BMI 25.70 kg/m    CC: check skin on arms Subjective:   HPI: Michael Decker is a 87 y.o. male presenting on 10/29/2020 for Mass (C/o growth on left forearm.  Noticed 2-3 wks ago.  Discomfort in the area with applied pressure.)   2 wk h/o tender growth to L posterior forearm about 1 inch distal to olecranon. He squashed area while exercising, caused bruising and area decreased in size.   No h/o gout.  He is on eliquis blood thinner.      Relevant past medical, surgical, family and social history reviewed and updated as indicated. Interim medical history since our last visit reviewed. Allergies and medications reviewed and updated. Outpatient Medications Prior to Visit  Medication Sig Dispense Refill  . apixaban (ELIQUIS) 5 MG TABS tablet Take 1 tablet (5 mg total) by mouth 2 (two) times daily. 90 tablet 1  . calcium carbonate (TUMS - DOSED IN MG ELEMENTAL CALCIUM) 500 MG chewable tablet Chew 1 tablet by mouth as needed for indigestion or heartburn.    . carvedilol (COREG) 3.125 MG tablet Take 3.125 mg by mouth daily.    . Cyanocobalamin (B-12) 5000 MCG SUBL Place 5,000 mcg under the tongue as needed.    . dutasteride (AVODART) 0.5 MG capsule TAKE 1 CAPSULE BY MOUTH EVERY OTHER DAY 45 capsule 1  . Glucosamine-Chondroitin (OSTEO BI-FLEX REGULAR STRENGTH PO) Take 1 tablet by mouth at bedtime.    Burdell Peed Docker Oil 500 MG CAPS Take 1 capsule by mouth daily.     . magnesium gluconate (MAGONATE) 500 MG tablet Take 500 mg by mouth daily.    . Multiple Vitamins-Minerals (MULTIVITAMIN WITH MINERALS) tablet Take 1 tablet by mouth daily.    . potassium gluconate 595 (99 K) MG TABS tablet Take 595 mg by mouth at bedtime.    Marland Kitchen telmisartan (MICARDIS) 80 MG tablet  TAKE 1 TABLET BY MOUTH EVERY DAY 90 tablet 1   No facility-administered medications prior to visit.     Per HPI unless specifically indicated in ROS section below Review of Systems Objective:  BP 140/72   Pulse (!) 56   Temp 97.9 F (36.6 C) (Temporal)   Ht 5\' 11"  (1.803 m)   Wt 184 lb 4 oz (83.6 kg)   SpO2 97%   BMI 25.70 kg/m   Wt Readings from Last 3 Encounters:  10/29/20 184 lb 4 oz (83.6 kg)  10/27/20 183 lb (83 kg)  10/12/20 184 lb 2 oz (83.5 kg)      Physical Exam Vitals and nursing note reviewed.  Constitutional:      Appearance: Normal appearance. He is not ill-appearing.  Musculoskeletal:        General: Swelling present. No tenderness. Normal range of motion.     Comments: Firm mass along posterior radius 1 inch distal to olecranon  Skin:    General: Skin is warm and dry.     Findings: No bruising, erythema or rash.  Neurological:     Mental Status: He is alert.  Psychiatric:        Mood and Affect: Mood normal.  Behavior: Behavior normal.       Assessment & Plan:  This visit occurred during the SARS-CoV-2 public health emergency.  Safety protocols were in place, including screening questions prior to the visit, additional usage of staff PPE, and extensive cleaning of exam room while observing appropriate contact time as indicated for disinfecting solutions.   Problem List Items Addressed This Visit    Forearm mass, left    Firm mass to L posterior forearm ?bone cyst vs other. Actually has decreased by about half in the past week after trauma to area. Will monitor for another few weeks, rec warm compresses to area. If persists, will image - start with plain films. Pt agrees with plan.           No orders of the defined types were placed in this encounter.  No orders of the defined types were placed in this encounter.   Follow up plan: Return if symptoms worsen or fail to improve.  Ria Bush, MD

## 2020-10-29 NOTE — Assessment & Plan Note (Addendum)
Firm mass to L posterior forearm ?bone cyst vs other. Actually has decreased by about half in the past week after trauma to area. Will monitor for another few weeks, rec warm compresses to area. If persists, will image - start with plain films. Pt agrees with plan.

## 2020-11-01 DIAGNOSIS — L820 Inflamed seborrheic keratosis: Secondary | ICD-10-CM | POA: Diagnosis not present

## 2020-11-01 DIAGNOSIS — L718 Other rosacea: Secondary | ICD-10-CM | POA: Diagnosis not present

## 2020-11-09 ENCOUNTER — Other Ambulatory Visit: Payer: Self-pay | Admitting: Cardiology

## 2020-11-09 DIAGNOSIS — I4821 Permanent atrial fibrillation: Secondary | ICD-10-CM

## 2020-11-10 NOTE — Telephone Encounter (Signed)
Eliquis 5mg  refill request received. Patient is 87 years old, weight-83.6kg, Crea-1.14 on 07/07/2020, Diagnosis-Afib, and last seen by Dr. Quentin Ore on 10/27/2020. Dose is appropriate based on dosing criteria. Will send in refill to requested pharmacy.

## 2020-11-15 ENCOUNTER — Ambulatory Visit (INDEPENDENT_AMBULATORY_CARE_PROVIDER_SITE_OTHER)
Admission: RE | Admit: 2020-11-15 | Discharge: 2020-11-15 | Disposition: A | Discharge status: Home / Self Care | Payer: Medicare Other | Source: Ambulatory Visit | Attending: Family Medicine | Admitting: Family Medicine

## 2020-11-15 ENCOUNTER — Other Ambulatory Visit: Payer: Self-pay | Admitting: Family Medicine

## 2020-11-15 DIAGNOSIS — R2232 Localized swelling, mass and lump, left upper limb: Secondary | ICD-10-CM

## 2020-11-15 DIAGNOSIS — M19032 Primary osteoarthritis, left wrist: Secondary | ICD-10-CM | POA: Diagnosis not present

## 2020-11-15 DIAGNOSIS — R2242 Localized swelling, mass and lump, left lower limb: Secondary | ICD-10-CM | POA: Diagnosis not present

## 2020-11-19 DIAGNOSIS — M461 Sacroiliitis, not elsewhere classified: Secondary | ICD-10-CM | POA: Diagnosis not present

## 2020-11-19 DIAGNOSIS — M47816 Spondylosis without myelopathy or radiculopathy, lumbar region: Secondary | ICD-10-CM | POA: Diagnosis not present

## 2020-11-23 ENCOUNTER — Encounter: Payer: Self-pay | Admitting: Family Medicine

## 2020-11-30 ENCOUNTER — Telehealth: Payer: Self-pay

## 2020-11-30 NOTE — Chronic Care Management (AMB) (Addendum)
    Chronic Care Management Pharmacy Assistant   Name: [name]  MRN: [id number] DOB: 1933-11-18  Reason for Encounter: Disease State Adherence Review  Recent office visits:  10/29/20- Dr. Danise Mina- PCP- Left forearm mass. No changes. Follow up PRN  Recent consult visits:  11/19/20- Dr. Milagros Evener- Orthopedics- Lumbar spondylosis. No changes. Follow up PRN 10/27/20- Dr. Lars Mage- Cardiology- Continue low dose coreg. Follow up in 6 months.   Hospital visits:  None in previous 6 months  Medications: Outpatient Encounter Medications as of 11/30/2020  Medication Sig   calcium carbonate (TUMS - DOSED IN MG ELEMENTAL CALCIUM) 500 MG chewable tablet Chew 1 tablet by mouth as needed for indigestion or heartburn.   carvedilol (COREG) 3.125 MG tablet Take 3.125 mg by mouth daily.   Cyanocobalamin (B-12) 5000 MCG SUBL Place 5,000 mcg under the tongue as needed.   dutasteride (AVODART) 0.5 MG capsule TAKE 1 CAPSULE BY MOUTH EVERY OTHER DAY   ELIQUIS 5 MG TABS tablet TAKE 1 TABLET BY MOUTH TWICE DAILY   Glucosamine-Chondroitin (OSTEO BI-FLEX REGULAR STRENGTH PO) Take 1 tablet by mouth at bedtime.   Krill Oil 500 MG CAPS Take 1 capsule by mouth daily.    magnesium gluconate (MAGONATE) 500 MG tablet Take 500 mg by mouth daily.   Multiple Vitamins-Minerals (MULTIVITAMIN WITH MINERALS) tablet Take 1 tablet by mouth daily.   potassium gluconate 595 (99 K) MG TABS tablet Take 595 mg by mouth at bedtime.   telmisartan (MICARDIS) 80 MG tablet TAKE 1 TABLET BY MOUTH EVERY DAY   No facility-administered encounter medications on file as of 11/30/2020.    Contacted [name] for general wellness call.   Since last visit with CPP, no interventions have been made.   The patient  has not had an ED visit since last contact.   The patient's current Chronic and PDC medications are:   Telmisartan 80 mg - Last filled 11/19/20 for 90 DS  The patient denies problems with their health.   The  patient denies  problems with his pharmacy.   The patient denies side effects with his medications.   he denies  concerns or questions for Debbora Dus, Pharm. D at this time.   Patient left voicemail for Debbora Dus to reschedule upcoming appointment. The appointment has been cancelled and scheduled for September. Denies the need to be seen any sooner at this time.   Follow-Up:  Pharmacist Review  Debbora Dus, CPP notified  Margaretmary Dys, [place] Assistant [phone]  I have reviewed the care management and care coordination activities outlined in this encounter and I am certifying that I agree with the content of this note. No further action required.  Debbora Dus, PharmD Clinical Pharmacist [facility] at [facility] [phone]

## 2020-12-06 ENCOUNTER — Telehealth: Payer: Self-pay | Admitting: Cardiology

## 2020-12-06 MED ORDER — CARVEDILOL 3.125 MG PO TABS: 3.1250 mg | ORAL_TABLET | Freq: Every day | ORAL | 1 refills | Status: DC

## 2020-12-06 NOTE — Telephone Encounter (Signed)
Requested Prescriptions   Signed Prescriptions Disp Refills   carvedilol (COREG) 3.125 MG tablet 90 tablet 1    Sig: Take 1 tablet (3.125 mg total) by mouth daily.    Authorizing Provider: Vickie Epley    Ordering User: Raelene Bott, [place] [facility]

## 2020-12-06 NOTE — Telephone Encounter (Signed)
This is a [place] pt 

## 2020-12-06 NOTE — Telephone Encounter (Signed)
*  STAT* If patient is at the pharmacy, call can be transferred to refill team.   1. Which medications need to be refilled? (please list name of each medication and dose if known) carvedilol (COREG) 3.125 MG tablet  2. Which pharmacy/location (including street and city if local pharmacy) is medication to be sent to? [facility] [place] [place]  3. Do they need a 30 day or 90 day supply? 90 day

## 2020-12-13 ENCOUNTER — Telehealth: Payer: Self-pay | Admitting: Cardiology

## 2020-12-13 MED ORDER — CARVEDILOL 3.125 MG PO TABS: 3.1250 mg | ORAL_TABLET | Freq: Two times a day (BID) | ORAL | 3 refills | Status: DC

## 2020-12-13 NOTE — Telephone Encounter (Signed)
Pt c/o medication issue:  1. Name of Medication:  Carvedilol   2. How are you currently taking this medication (dosage and times per day)? Please clarify   3. Are you having a reaction (difficulty breathing--STAT)?  Dizziness at times   4. What is your medication issue?  Patient states verbal instructions from md for BID but refill sent for once daily   Patient c/o some intermittent dizziness while taking BID for the past 2 months.   Please clarify dose.

## 2020-12-13 NOTE — Telephone Encounter (Signed)
Returned call to Pt.  Confirmed Pt should be taking carvedilol 3.125 mg PO BID.  Resubmitted corrected prescription to [facility] as requested.  Pt indicates understanding.

## 2020-12-24 DIAGNOSIS — Z20822 Contact with and (suspected) exposure to covid-19: Secondary | ICD-10-CM | POA: Diagnosis not present

## 2020-12-28 ENCOUNTER — Ambulatory Visit: Payer: PPO | Admitting: Family Medicine

## 2021-01-04 ENCOUNTER — Ambulatory Visit (INDEPENDENT_AMBULATORY_CARE_PROVIDER_SITE_OTHER): Payer: Medicare Other | Admitting: Family Medicine

## 2021-01-04 ENCOUNTER — Encounter: Payer: Self-pay | Admitting: Family Medicine

## 2021-01-04 ENCOUNTER — Other Ambulatory Visit: Payer: Self-pay

## 2021-01-04 VITALS — BP 160/80 | HR 56 | Temp 98.0°F | Ht 71.0 in | Wt 182.0 lb

## 2021-01-04 DIAGNOSIS — I10 Essential (primary) hypertension: Secondary | ICD-10-CM

## 2021-01-04 DIAGNOSIS — G8929 Other chronic pain: Secondary | ICD-10-CM | POA: Diagnosis not present

## 2021-01-04 DIAGNOSIS — M545 Low back pain, unspecified: Secondary | ICD-10-CM | POA: Diagnosis not present

## 2021-01-04 DIAGNOSIS — I4819 Other persistent atrial fibrillation: Secondary | ICD-10-CM | POA: Diagnosis not present

## 2021-01-04 NOTE — Patient Instructions (Signed)
I think back pain is coming from arthritis - continue gentle stretching, tylenol use, heating pad or ice, let us know if not improving with this.  Blood pressures were too high. Start monitoring more closely at home - let me know if consistently >140/90.  Increase water, limit salt/sodium in the diet.  Return in 1 month for BP check. Bring in home BP cuff to next visit.

## 2021-01-04 NOTE — Progress Notes (Signed)
Patient ID: Michael Decker, male    DOB: [birth year], 87 y.o.   MRN: [id number]  This visit was conducted in person.  BP (!) 160/80   Pulse (!) 56   Temp 98 F (36.7 C) (Temporal)   Ht 5\' 11"  (1.803 m)   Wt 182 lb (82.6 kg)   SpO2 96%   BMI 25.38 kg/m   On repeat 166/76  CC: 6 mo f/u visit  Subjective:   HPI: Michael Decker is a 87 y.o. male presenting on 01/04/2021 for Follow-up   Known rate controlled permanent atrial fibrillation with carvedilol 3.125mg  bid and on eliquis for stroke prevention. Also on telmisartan for h/o HTN/mild sCHF. Had previously discussed stopping carvedilol if doing well with EP Quentin Ore) - however he continues this. He previously had unsuccessful cardioversion 06/2020. Feels he's lost significant stamina since starting afib medications (?carvedilol).   Notes progressive lower back pain worse at night time (ie when he wakes up in middle of the night to use bathroom). Manages with lower back stretching exercises, tylenol 1300mg  nightly, and CBD oil rub. Known arthritis by previous xrays. Takes nap at 2pm.   Acute pain to R lower back started this morning when he turned over in bed. No shooting pain down legs or numbness or weakness of legs.      Relevant past medical, surgical, family and social history reviewed and updated as indicated. Interim medical history since our last visit reviewed. Allergies and medications reviewed and updated. Outpatient Medications Prior to Visit  Medication Sig Dispense Refill   calcium carbonate (TUMS - DOSED IN MG ELEMENTAL CALCIUM) 500 MG chewable tablet Chew 1 tablet by mouth as needed for indigestion or heartburn.     carvedilol (COREG) 3.125 MG tablet Take 1 tablet (3.125 mg total) by mouth 2 (two) times daily. 180 tablet 3   Cyanocobalamin (B-12) 5000 MCG SUBL Place 5,000 mcg under the tongue as needed.     dutasteride (AVODART) 0.5 MG capsule TAKE 1 CAPSULE BY MOUTH EVERY OTHER DAY 45 capsule 1   ELIQUIS 5 MG TABS  tablet TAKE 1 TABLET BY MOUTH TWICE DAILY 180 tablet 1   Glucosamine-Chondroitin (OSTEO BI-FLEX REGULAR STRENGTH PO) Take 1 tablet by mouth at bedtime.     Krill Oil 500 MG CAPS Take 1 capsule by mouth daily.      magnesium gluconate (MAGONATE) 500 MG tablet Take 500 mg by mouth daily.     metroNIDAZOLE (METROCREAM) 0.75 % cream SMARTSIG:Sparingly Topical Twice Daily     MISC NATURAL PRODUCTS EX Apply topically. Cannabis cream     Multiple Vitamins-Minerals (MULTIVITAMIN WITH MINERALS) tablet Take 1 tablet by mouth daily.     potassium gluconate 595 (99 K) MG TABS tablet Take 595 mg by mouth at bedtime.     telmisartan (MICARDIS) 80 MG tablet TAKE 1 TABLET BY MOUTH EVERY DAY 90 tablet 1   No facility-administered medications prior to visit.     Per HPI unless specifically indicated in ROS section below Review of Systems Objective:  BP (!) 160/80   Pulse (!) 56   Temp 98 F (36.7 C) (Temporal)   Ht 5\' 11"  (1.803 m)   Wt 182 lb (82.6 kg)   SpO2 96%   BMI 25.38 kg/m   Wt Readings from Last 3 Encounters:  01/04/21 182 lb (82.6 kg)  10/29/20 184 lb 4 oz (83.6 kg)  10/27/20 183 lb (83 kg)      Physical Exam Vitals and nursing  note reviewed.  Constitutional:      Appearance: Normal appearance. He is not ill-appearing.  Cardiovascular:     Rate and Rhythm: Normal rate. Rhythm irregularly irregular.     Pulses: Normal pulses.     Heart sounds: Normal heart sounds. No murmur heard. Pulmonary:     Effort: Pulmonary effort is normal. No respiratory distress.     Breath sounds: Normal breath sounds. No wheezing, rhonchi or rales.  Musculoskeletal:        General: Normal range of motion.     Right lower leg: No edema.     Left lower leg: No edema.     Comments:  No pain midline spine No paraspinous mm tenderness Neg SLR bilaterally. No pain with int/ext rotation at hip. No pain at SIJ, GTB or sciatic notch bilaterally.   Skin:    General: Skin is warm and dry.     Findings: No  rash.  Neurological:     Mental Status: He is alert.     Comments: 5/5 streng BLE  Psychiatric:        Mood and Affect: Mood normal.        Behavior: Behavior normal.      LUMBAR SPINE - 2-3 VIEW COMPARISON:  None. FINDINGS: Five non-rib-bearing lumbar vertebrae. Mild dextroconvex thoracolumbar scoliosis. Moderate anterior and lateral spur formation at multiple levels of the lumbar and lower thoracic spine. Facet degenerative changes throughout the lumbar spine, most pronounced at the mid and lower levels. Associated grade 1 retrolisthesis at the L3-4 level and grade 1 anterolisthesis at the L4-5 level. No fractures or pars defects. Atheromatous arterial calcifications. IMPRESSION: Multilevel degenerative changes, as described above. Electronically Signed   By: Claudie Revering M.D.   On: 12/19/2019 10:54  Assessment & Plan:  This visit occurred during the SARS-CoV-2 public health emergency.  Safety protocols were in place, including screening questions prior to the visit, additional usage of staff PPE, and extensive cleaning of exam room while observing appropriate contact time as indicated for disinfecting solutions.   Problem List Items Addressed This Visit     HTN (hypertension) - Primary    Chronic, deteriorated based on today's reading.  He will start checking BP more consistently at home and let me know if consistently >140/90 to add 3rd antihypertensive medication as BB dosing limited by bradycardia. RTC 1 mo HTN f/u. He will also bring BP cuff to that appt or to upcoming cardiology appointment to compare home cuff.        Low back pain    Anticipate related to known lumbar DDD. Supportive care reviewed - heating pad, ice, tylenol, gentle stretching. Consider updating imaging.        Persistent atrial fibrillation (HCC)    Remains irregular.  Continue coreg, eliquis.  Pt notes coreg contributing to decreased stamina.          No orders of the defined types were  placed in this encounter.  No orders of the defined types were placed in this encounter.   Patient Instructions  I think back pain is coming from arthritis - continue gentle stretching, tylenol use, heating pad or ice, let us know if not improving with this.  Blood pressures were too high. Start monitoring more closely at home - let me know if consistently >140/90.  Increase water, limit salt/sodium in the diet.  Return in 1 month for BP check. Bring in home BP cuff to next visit.    Follow up plan: Return  in about 4 weeks (around 02/01/2021) for follow up visit.  Ria Bush, MD

## 2021-01-05 NOTE — Assessment & Plan Note (Signed)
Remains irregular.  Continue coreg, eliquis.  Pt notes coreg contributing to decreased stamina.

## 2021-01-05 NOTE — Assessment & Plan Note (Addendum)
Anticipate related to known lumbar DDD. Supportive care reviewed - heating pad, ice, tylenol, gentle stretching. Consider updating imaging.

## 2021-01-05 NOTE — Assessment & Plan Note (Signed)
Chronic, deteriorated based on today's reading.  He will start checking BP more consistently at home and let me know if consistently >140/90 to add 3rd antihypertensive medication as BB dosing limited by bradycardia. RTC 1 mo HTN f/u. He will also bring BP cuff to that appt or to upcoming cardiology appointment to compare home cuff.

## 2021-01-18 ENCOUNTER — Encounter: Payer: Self-pay | Admitting: Family Medicine

## 2021-01-18 NOTE — Telephone Encounter (Signed)
Spoke with pt scheduling OV with Dr. Silvio Pate on 01/26/21 at 8:30 for the rash.

## 2021-01-19 ENCOUNTER — Ambulatory Visit (INDEPENDENT_AMBULATORY_CARE_PROVIDER_SITE_OTHER): Payer: Medicare Other | Admitting: Family Medicine

## 2021-01-19 ENCOUNTER — Encounter: Payer: Self-pay | Admitting: Family Medicine

## 2021-01-19 ENCOUNTER — Other Ambulatory Visit: Payer: Self-pay

## 2021-01-19 VITALS — BP 132/68 | HR 54 | Temp 98.0°F | Ht 71.0 in | Wt 182.1 lb

## 2021-01-19 DIAGNOSIS — R21 Rash and other nonspecific skin eruption: Secondary | ICD-10-CM | POA: Diagnosis not present

## 2021-01-19 DIAGNOSIS — I10 Essential (primary) hypertension: Secondary | ICD-10-CM

## 2021-01-19 LAB — POC URINALSYSI DIPSTICK (AUTOMATED)
Bilirubin, UA: NEGATIVE
Blood, UA: NEGATIVE
Glucose, UA: NEGATIVE
Ketones, UA: NEGATIVE
Leukocytes, UA: NEGATIVE
Nitrite, UA: NEGATIVE
Protein, UA: NEGATIVE
Spec Grav, UA: 1.02 (ref 1.010–1.025)
Urobilinogen, UA: 0.2 E.U./dL
pH, UA: 5.5 (ref 5.0–8.0)

## 2021-01-19 MED ORDER — NYSTATIN 100000 UNIT/GM EX CREA: 1.0000 "application " | TOPICAL_CREAM | Freq: Two times a day (BID) | CUTANEOUS | 0 refills | Status: DC

## 2021-01-19 NOTE — Telephone Encounter (Signed)
Seen today. 

## 2021-01-19 NOTE — Assessment & Plan Note (Signed)
Chronic, uncontrolled in office however brings meticulous home log showing average readings over the past 2 weeks 132/68. Anticipate component of white coat hypertension based on this. No med changes, advised to continue monitoring BP at home and let us know if consistently elevated.

## 2021-01-19 NOTE — Progress Notes (Signed)
Patient ID: Michael Decker, male    DOB: 08-08-1933, 87 y.o.   MRN: [id number]  This visit was conducted in person.  BP 132/68 Comment: home average  Pulse (!) 54   Temp 98 F (36.7 C) (Temporal)   Ht 5\' 11"  (1.803 m)   Wt 182 lb 2 oz (82.6 kg)   SpO2 97%   BMI 25.40 kg/m   BP Readings from Last 3 Encounters:  01/19/21 132/68  01/04/21 (!) 160/80  10/29/20 140/72  BP 162/84 on repeat testing Home readings averaging 132/68, HR 54  CC: rash to penis Subjective:   HPI: Michael Decker is a 87 y.o. male presenting on 01/19/2021 for Rash (C/o rash on penis.  Noticed 4-5 wks ago.  Also, pt brought in home BP monitor to compare.  Reading in office today, 160 84.)   4-5 wks ago noticed red rash to tip penis without pain or itching. Treated at home with hand lotion without benefit.  No urethral discharge, no swollen glands, no new sexual contacts. No dysuria.   HTN - continues carvedilol 3.125mg  bid as well as telmisartan 80mg  daily. Home readings over the past 2 weeks 116-146/62-85, averaging 132/68, pulse 54. Home BP cuff accurate when compared to ours in office today. Likely a component of white coat hypertension.      Relevant past medical, surgical, family and social history reviewed and updated as indicated. Interim medical history since our last visit reviewed. Allergies and medications reviewed and updated. Outpatient Medications Prior to Visit  Medication Sig Dispense Refill   calcium carbonate (TUMS - DOSED IN MG ELEMENTAL CALCIUM) 500 MG chewable tablet Chew 1 tablet by mouth as needed for indigestion or heartburn.     carvedilol (COREG) 3.125 MG tablet Take 1 tablet (3.125 mg total) by mouth 2 (two) times daily. 180 tablet 3   Cyanocobalamin (B-12) 5000 MCG SUBL Place 5,000 mcg under the tongue as needed.     dutasteride (AVODART) 0.5 MG capsule TAKE 1 CAPSULE BY MOUTH EVERY OTHER DAY 45 capsule 1   ELIQUIS 5 MG TABS tablet TAKE 1 TABLET BY MOUTH TWICE DAILY 180 tablet 1    Glucosamine-Chondroitin (OSTEO BI-FLEX REGULAR STRENGTH PO) Take 1 tablet by mouth at bedtime.     Krill Oil 500 MG CAPS Take 1 capsule by mouth daily.      magnesium gluconate (MAGONATE) 500 MG tablet Take 500 mg by mouth daily.     metroNIDAZOLE (METROCREAM) 0.75 % cream SMARTSIG:Sparingly Topical Twice Daily     MISC NATURAL PRODUCTS EX Apply topically. Cannabis cream     Multiple Vitamins-Minerals (MULTIVITAMIN WITH MINERALS) tablet Take 1 tablet by mouth daily.     potassium gluconate 595 (99 K) MG TABS tablet Take 595 mg by mouth at bedtime.     telmisartan (MICARDIS) 80 MG tablet TAKE 1 TABLET BY MOUTH EVERY DAY 90 tablet 1   No facility-administered medications prior to visit.     Per HPI unless specifically indicated in ROS section below Review of Systems  Objective:  BP 132/68 Comment: home average  Pulse (!) 54   Temp 98 F (36.7 C) (Temporal)   Ht 5\' 11"  (1.803 m)   Wt 182 lb 2 oz (82.6 kg)   SpO2 97%   BMI 25.40 kg/m   Wt Readings from Last 3 Encounters:  01/19/21 182 lb 2 oz (82.6 kg)  01/04/21 182 lb (82.6 kg)  10/29/20 184 lb 4 oz (83.6 kg)  Physical Exam Vitals and nursing note reviewed.  Constitutional:      Appearance: Normal appearance. He is not ill-appearing.  Abdominal:     Hernia: There is no hernia in the left inguinal area or right inguinal area.  Genitourinary:    Pubic Area: No rash.      Penis: Normal and circumcised.      Testes: Normal.        Right: Mass, tenderness or swelling not present.        Left: Mass, tenderness or swelling not present.  Lymphadenopathy:     Lower Body: No right inguinal adenopathy. No left inguinal adenopathy.  Skin:    General: Skin is warm and dry.     Findings: Erythema and rash present.     Comments: Erythematous dry macular rash on glans penis L>R, no itching or tenderness  Neurological:     Mental Status: He is alert.      Results for orders placed or performed in visit on 01/19/21  POCT  Urinalysis Dipstick (Automated)  Result Value Ref Range   Color, UA light yellow    Clarity, UA clear    Glucose, UA Negative Negative   Bilirubin, UA negative    Ketones, UA negative    Spec Grav, UA 1.020 1.010 - 1.025   Blood, UA negative    pH, UA 5.5 5.0 - 8.0   Protein, UA Negative Negative   Urobilinogen, UA 0.2 0.2 or 1.0 E.U./dL   Nitrite, UA negative    Leukocytes, UA Negative Negative    Assessment & Plan:  This visit occurred during the SARS-CoV-2 public health emergency.  Safety protocols were in place, including screening questions prior to the visit, additional usage of staff PPE, and extensive cleaning of exam room while observing appropriate contact time as indicated for disinfecting solutions.   Problem List Items Addressed This Visit     HTN (hypertension)    Chronic, uncontrolled in office however brings meticulous home log showing average readings over the past 2 weeks 132/68. Anticipate component of white coat hypertension based on this. No med changes, advised to continue monitoring BP at home and let us know if consistently elevated.       Penile rash - Primary    Unclear etiology. Not consistent with balanitis given no discharge, skin of penis is dry. Will regardless trial nystatin to cover possible yeast infection.  Prudent to refer to dermatology to r/o Bowen disease of penis.        Relevant Orders   POCT Urinalysis Dipstick (Automated) (Completed)   Ambulatory referral to Dermatology     Meds ordered this encounter  Medications   nystatin cream (MYCOSTATIN)    Sig: Apply 1 application topically 2 (two) times daily.    Dispense:  30 g    Refill:  0   Orders Placed This Encounter  Procedures   Ambulatory referral to Dermatology    Referral Priority:   Routine    Referral Type:   Consultation    Referral Reason:   Specialty Services Required    Requested Specialty:   Dermatology    Number of Visits Requested:   1   POCT Urinalysis Dipstick  (Automated)     Patient Instructions  Treat as possible yeast infection with nystatin cream sent to pharmacy.  I do recommend seeing dermatologist for further evaluation to rule out other cause.  We will refer you to dermatologist.  Let us know if spreading or worsening rash.  Continue monitoring blood pressures, let us know if running high at home.   Follow up plan: Return if symptoms worsen or fail to improve.  Ria Bush, MD

## 2021-01-19 NOTE — Assessment & Plan Note (Signed)
Unclear etiology. Not consistent with balanitis given no discharge, skin of penis is dry. Will regardless trial nystatin to cover possible yeast infection.  Prudent to refer to dermatology to r/o Bowen disease of penis.

## 2021-01-19 NOTE — Patient Instructions (Addendum)
Treat as possible yeast infection with nystatin cream sent to pharmacy.  I do recommend seeing dermatologist for further evaluation to rule out other cause.  We will refer you to dermatologist.  Let us know if spreading or worsening rash.  Continue monitoring blood pressures, let us know if running high at home.

## 2021-01-22 ENCOUNTER — Emergency Department
Admission: EM | Admit: 2021-01-22 | Discharge: 2021-01-22 | Disposition: A | Discharge status: Home / Self Care | Payer: Medicare Other | Attending: Emergency Medicine | Admitting: Emergency Medicine

## 2021-01-22 ENCOUNTER — Other Ambulatory Visit: Payer: Self-pay

## 2021-01-22 DIAGNOSIS — W268XXA Contact with other sharp object(s), not elsewhere classified, initial encounter: Secondary | ICD-10-CM | POA: Diagnosis not present

## 2021-01-22 DIAGNOSIS — I10 Essential (primary) hypertension: Secondary | ICD-10-CM | POA: Diagnosis not present

## 2021-01-22 DIAGNOSIS — Z87891 Personal history of nicotine dependence: Secondary | ICD-10-CM | POA: Diagnosis not present

## 2021-01-22 DIAGNOSIS — Y93J2 Activity, drum and other percussion instrument playing: Secondary | ICD-10-CM | POA: Diagnosis not present

## 2021-01-22 DIAGNOSIS — Z23 Encounter for immunization: Secondary | ICD-10-CM | POA: Insufficient documentation

## 2021-01-22 DIAGNOSIS — I4819 Other persistent atrial fibrillation: Secondary | ICD-10-CM | POA: Diagnosis not present

## 2021-01-22 DIAGNOSIS — S61210A Laceration without foreign body of right index finger without damage to nail, initial encounter: Secondary | ICD-10-CM | POA: Insufficient documentation

## 2021-01-22 DIAGNOSIS — Z7901 Long term (current) use of anticoagulants: Secondary | ICD-10-CM | POA: Insufficient documentation

## 2021-01-22 DIAGNOSIS — Z79899 Other long term (current) drug therapy: Secondary | ICD-10-CM | POA: Insufficient documentation

## 2021-01-22 DIAGNOSIS — S6991XA Unspecified injury of right wrist, hand and finger(s), initial encounter: Secondary | ICD-10-CM | POA: Diagnosis present

## 2021-01-22 MED ORDER — TETANUS-DIPHTH-ACELL PERTUSSIS 5-2.5-18.5 LF-MCG/0.5 IM SUSY
0.5000 mL | PREFILLED_SYRINGE | Freq: Once | INTRAMUSCULAR | Status: AC
  Administered 2021-01-22: 0.5 mL via INTRAMUSCULAR
  Filled 2021-01-22: qty 0.5

## 2021-01-22 NOTE — ED Triage Notes (Signed)
Pt presents via POV c/o laceration to right index finger. Pt currently on Eliquis for a fib. Tourniquet applied to finger base by Juliette Mangle. Bleeding controlled at this time.

## 2021-01-22 NOTE — ED Provider Notes (Signed)
[facility] Emergency Department Provider Note  ____________________________________________  Time seen: Approximately 6:45 PM  I have reviewed the triage vital signs and the nursing notes.   HISTORY  Chief Complaint Laceration    HPI Michael Decker is a 87 y.o. male who presents to the emergency department complaining of a bleeding laceration to the index finger of the right hand.  Patient was using a mandolin cutter when he accidentally caught his index finger.  He states that he has a small avulsion type laceration but is on blood thinners due to A. fib and cannot get the bleeding to be controlled.  Patient arrived with active heavy bleeding and had a finger tourniquet applied.  Patient was able to control bleeding with direct pressure and finger tourniquet.  Patient currently has no bleeding at my time of assessment.  Patient is unsure of his last tetanus shot but states that has been years ago.  No other injury or complaint at this time.       Past Medical History:  Diagnosis Date   Arthritis    knee   BPH (benign prostatic hypertrophy)    on avodart, released from urologist care   Colon polyps 2013   TA 2013 Henrene Pastor)   Diverticulosis    GERD (gastroesophageal reflux disease)    Hematospermia    s/p uro eval   HLD (hyperlipidemia)    mild   HTN (hypertension)    Wears hearing aid in left ear     Patient Active Problem List   Diagnosis Date Noted   Penile rash 01/19/2021   Forearm mass, left 10/29/2020   Dizziness 05/25/2020   Persistent atrial fibrillation ([place]) 05/25/2020   Iron excess 06/28/2017   Peripheral neuropathy 06/08/2017   Arthritis of left knee 04/16/2017   Health maintenance examination 07/06/2016   Leg cramping 07/06/2016   Low back pain 02/23/2015   Medicare annual wellness visit, subsequent 06/23/2014   Advanced care planning/counseling discussion 06/23/2014   Rhinorrhea 06/23/2014   HTN (hypertension)    HLD  (hyperlipidemia)    GERD (gastroesophageal reflux disease)    Benign prostatic hyperplasia    Colon polyps     Past Surgical History:  Procedure Laterality Date   BUNIONECTOMY Right 2014   CARDIOVERSION N/A 07/09/2020   Procedure: CARDIOVERSION;  Surgeon: Wellington Hampshire, MD;  Location: [facility];  Service: Cardiovascular;  Laterality: N/A;   CATARACT EXTRACTION W/PHACO Right 08/14/2018   Procedure: CATARACT EXTRACTION PHACO AND INTRAOCULAR LENS PLACEMENT ([place])  RIGHT;  Surgeon: Leandrew Koyanagi, MD;  Location: [place];  Service: Ophthalmology;  Laterality: Right;  requests arrival between 830 & 1030   CATARACT EXTRACTION W/PHACO Left 09/11/2018   Procedure: CATARACT EXTRACTION PHACO AND INTRAOCULAR LENS PLACEMENT ([place])  LEFT;  Surgeon: Leandrew Koyanagi, MD;  Location: [place];  Service: Ophthalmology;  Laterality: Left;  Doesn't want to be early   COLONOSCOPY  2008   COLONOSCOPY  01/2012   TA x1, diverticulosis, rpt 5 yrs Henrene Pastor)   COLONOSCOPY  02/2017   TA, diverticulosis, int hem no f/u needed Henrene Pastor)   [facility]      Prior to Admission medications   Medication Sig Start Date End Date Taking? Authorizing Provider  calcium carbonate (TUMS - DOSED IN MG ELEMENTAL CALCIUM) 500 MG chewable tablet Chew 1 tablet by mouth as needed for indigestion or heartburn.    [provider]  carvedilol (COREG)  3.125 MG tablet Take 1 tablet (3.125 mg total) by mouth 2 (two) times daily. 12/13/20 03/13/21  Vickie Epley, MD  Cyanocobalamin (B-12) 5000 MCG SUBL Place 5,000 mcg under the tongue as needed.    [provider]  dutasteride (AVODART) 0.5 MG capsule TAKE 1 CAPSULE BY MOUTH EVERY OTHER DAY 08/23/20   Ria Bush, MD  ELIQUIS 5 MG TABS tablet TAKE 1 TABLET BY MOUTH TWICE DAILY 11/10/20   Vickie Epley, MD  Glucosamine-Chondroitin (OSTEO BI-FLEX REGULAR STRENGTH PO) Take 1 tablet by mouth  at bedtime.    [provider]  Astrid Drafts 500 MG CAPS Take 1 capsule by mouth daily.     [provider]  magnesium gluconate (MAGONATE) 500 MG tablet Take 500 mg by mouth daily.    [provider]  metroNIDAZOLE (METROCREAM) 0.75 % cream SMARTSIG:Sparingly Topical Twice Daily 12/28/20   [provider]  MISC NATURAL PRODUCTS EX Apply topically. Cannabis cream    [provider]  Multiple Vitamins-Minerals (MULTIVITAMIN WITH MINERALS) tablet Take 1 tablet by mouth daily.    [provider]  nystatin cream (MYCOSTATIN) Apply 1 application topically 2 (two) times daily. 01/19/21   Ria Bush, MD  potassium gluconate 595 (99 K) MG TABS tablet Take 595 mg by mouth at bedtime.    [provider]  telmisartan (MICARDIS) 80 MG tablet TAKE 1 TABLET BY MOUTH EVERY DAY 08/24/20   Ria Bush, MD    Allergies Metoprolol  Family History  Problem Relation Age of Onset   Sudden death Father 39       ?MI   Heart disease Brother 61       ?afib   Cancer Neg Hx    Diabetes Neg Hx    Stroke Neg Hx    Colon cancer Neg Hx    Esophageal cancer Neg Hx    Stomach cancer Neg Hx     Social History Social History   Tobacco Use   Smoking status: Former    Pack years: 0.00   Smokeless tobacco: Never   Tobacco comments:    cigars back in the 60's  Vaping Use   Vaping Use: Never used  Substance Use Topics   Alcohol use: Yes    Comment: occassionally-beer or wine   Drug use: No     Review of Systems  Constitutional: No fever/chills Eyes: No visual changes. No discharge ENT: No upper respiratory complaints. Cardiovascular: no chest pain. Respiratory: no cough. No SOB. Gastrointestinal: No abdominal pain.  No nausea, no vomiting.  No diarrhea.  No constipation. Musculoskeletal: Laceration to the index finger of the right hand Skin: Negative for rash, abrasions, lacerations, ecchymosis. Neurological: Negative for headaches,  focal weakness or numbness.  10 System ROS otherwise negative.  ____________________________________________   PHYSICAL EXAM:  VITAL SIGNS: ED Triage Vitals  Enc Vitals Group     BP 01/22/21 1654 (!) 148/82     Pulse Rate 01/22/21 1654 (!) 50     Resp 01/22/21 1654 16     Temp 01/22/21 1654 98.2 F (36.8 C)     Temp Source 01/22/21 1654 Oral     SpO2 01/22/21 1654 100 %     Weight --      Height --      Head Circumference --      Peak Flow --      Pain Score 01/22/21 1655 2     Pain Loc --      Pain  Edu? --      Excl. in [place]? --      Constitutional: Alert and oriented. Well appearing and in no acute distress. Eyes: Conjunctivae are normal. PERRL. EOMI. Head: Atraumatic. ENT:      Ears:       Nose: No congestion/rhinnorhea.      Mouth/Throat: Mucous membranes are moist.  Neck: No stridor.    Cardiovascular: Normal rate, regular rhythm. Normal S1 and S2.  Good peripheral circulation. Respiratory: Normal respiratory effort without tachypnea or retractions. Lungs CTAB. Good air entry to the bases with no decreased or absent breath sounds. Musculoskeletal: Full range of motion to all extremities. No gross deformities appreciated.  Patient has an avulsion type laceration to the distal aspect of the index finger.  This is relatively small in nature.  There was a resumption of bleeding when I removed the dressing however this was minimal at this time.  No pulsatile bleeding.  Easily controlled with direct pressure.  Full range of motion to the digit.  Capillary refill intact.  Sensation intact to the digit. Neurologic:  Normal speech and language. No gross focal neurologic deficits are appreciated.  Skin:  Skin is warm, dry and intact. No rash noted. Psychiatric: Mood and affect are normal. Speech and behavior are normal. Patient exhibits appropriate insight and judgement.   ____________________________________________   LABS (all labs ordered are listed, but only abnormal  results are displayed)  Labs Reviewed - No data to display ____________________________________________  EKG   ____________________________________________  RADIOLOGY   No results found.  ____________________________________________    PROCEDURES  Procedure(s) performed:    Marland KitchenMarland KitchenLaceration Repair  Date/Time: 01/22/2021 7:10 PM Performed by: Darletta Moll, PA-C Authorized by: Darletta Moll, PA-C   Consent:    Consent obtained:  Verbal   Consent given by:  Patient   Risks discussed:  Infection, pain and vascular damage Universal protocol:    Procedure explained and questions answered to patient or proxy's satisfaction: yes     Immediately prior to procedure, a time out was called: yes     Patient identity confirmed:  Verbally with patient Anesthesia:    Anesthesia method:  None Laceration details:    Location:  Finger   Finger location:  R index finger Exploration:    Hemostasis achieved with:  Tourniquet   Imaging outcome: foreign body not noted     Wound exploration: wound explored through full range of motion and entire depth of wound visualized     Wound extent: no foreign bodies/material noted and no underlying fracture noted   Treatment:    Area cleansed with:  Povidone-iodine and Shur-Clens   Amount of cleaning:  Standard Approximation:    Approximation:  Loose Repair type:    Repair type:  Simple Post-procedure details:    Dressing:  Non-adherent dressing and tube gauze   Procedure completion:  Tolerated well, no immediate complications Comments:     Patient has an avulsion type laceration to the index finger. Patient arrived with uncontrolled bleeding.  This required a finger tourniquet to control bleeding and patient currently has good bleeding cessation.  Given the nature of injury Surgicel dressing was applied with nonadherent over top.  Patient has ongoing good control of bleeding with no bleedthrough..      Medications  Tdap  (BOOSTRIX) injection 0.5 mL (has no administration in time range)     ____________________________________________   INITIAL IMPRESSION / ASSESSMENT AND PLAN / ED COURSE  Pertinent labs & imaging results  that were available during my care of the patient were reviewed by me and considered in my medical decision making (see chart for details).  Review of the [place] CSRS was performed in accordance of the [place] prior to dispensing any controlled drugs.           Patient's diagnosis is consistent with Avulsion type laceration of the index finger of the right hand.  Patient presents to the ED after sustaining an avulsion laceration to the index finger.  This occurred while using a mandolin cutter.  Initially patient did not have bleeding controlled.  Finger tourniquet was applied in the emergency department and bleeding was successfully controlled.  Area was thoroughly cleansed and dressed as described above.  Wound care instructions discussed with the patient and his wife.  Tetanus shot is updated today.  Follow-up primary care as needed.  Again wound care instructions discussed thoroughly with the patient. Patient is given ED precautions to return to the ED for any worsening or new symptoms.     ____________________________________________  FINAL CLINICAL IMPRESSION(S) / ED DIAGNOSES  Final diagnoses:  Laceration of right index finger without foreign body without damage to nail, initial encounter      NEW MEDICATIONS STARTED DURING THIS VISIT:  ED Discharge Orders     None           This chart was dictated using voice recognition software/Dragon. Despite best efforts to proofread, errors can occur which can change the meaning. Any change was purely unintentional.    Darletta Moll, PA-C 01/22/21 1915    Harvest Dark, MD 01/22/21 2337

## 2021-01-22 NOTE — ED Notes (Signed)
Tourniquet released at this time.  Bleeding remains controlled.  PMS intact.

## 2021-01-25 ENCOUNTER — Other Ambulatory Visit: Payer: Self-pay

## 2021-01-25 ENCOUNTER — Encounter: Payer: Self-pay | Admitting: Family Medicine

## 2021-01-25 ENCOUNTER — Ambulatory Visit (INDEPENDENT_AMBULATORY_CARE_PROVIDER_SITE_OTHER): Payer: Medicare Other | Admitting: Family Medicine

## 2021-01-25 DIAGNOSIS — S61210A Laceration without foreign body of right index finger without damage to nail, initial encounter: Secondary | ICD-10-CM | POA: Diagnosis not present

## 2021-01-25 NOTE — Patient Instructions (Signed)
Return to clinic Friday 12:30pm for wound check

## 2021-01-25 NOTE — Progress Notes (Signed)
Patient ID: Michael Decker, male    DOB: 10-Nov-1933, 87 y.o.   MRN: [id number]  This visit was conducted in person.  BP 140/82   Pulse 62   Temp 97.8 F (36.6 C) (Temporal)   Ht 5\' 11"  (1.803 m)   Wt 182 lb 9 oz (82.8 kg)   SpO2 97%   BMI 25.46 kg/m    CC: ER f/u visit  Subjective:   HPI: Michael Decker is a 87 y.o. male presenting on 01/25/2021 for Hospitalization Follow-up (Seen on 01/22/21 at [facility] ED, dx laceration of right index finger.  Pt accompanied by wife, [name]- temp 97.8.)   DOI: 01/22/2021  Seen at ER 01/22/2021 after sustained R index finger laceration, had difficulty controlling bleeding due to eliquis use. Tourniquet used to control bleeding. Surgical dressing was placed.  He cut finger while cutting cucumber with mandolin cutter.  Tdap updated.  No pain.      Relevant past medical, surgical, family and social history reviewed and updated as indicated. Interim medical history since our last visit reviewed. Allergies and medications reviewed and updated. Outpatient Medications Prior to Visit  Medication Sig Dispense Refill   calcium carbonate (TUMS - DOSED IN MG ELEMENTAL CALCIUM) 500 MG chewable tablet Chew 1 tablet by mouth as needed for indigestion or heartburn.     carvedilol (COREG) 3.125 MG tablet Take 1 tablet (3.125 mg total) by mouth 2 (two) times daily. 180 tablet 3   Cyanocobalamin (B-12) 5000 MCG SUBL Place 5,000 mcg under the tongue as needed.     dutasteride (AVODART) 0.5 MG capsule TAKE 1 CAPSULE BY MOUTH EVERY OTHER DAY 45 capsule 1   ELIQUIS 5 MG TABS tablet TAKE 1 TABLET BY MOUTH TWICE DAILY 180 tablet 1   Glucosamine-Chondroitin (OSTEO BI-FLEX REGULAR STRENGTH PO) Take 1 tablet by mouth at bedtime.     Krill Oil 500 MG CAPS Take 1 capsule by mouth daily.      magnesium gluconate (MAGONATE) 500 MG tablet Take 500 mg by mouth daily.     metroNIDAZOLE (METROCREAM) 0.75 % cream SMARTSIG:Sparingly Topical Twice Daily     MISC NATURAL PRODUCTS EX  Apply topically. Cannabis cream     Multiple Vitamins-Minerals (MULTIVITAMIN WITH MINERALS) tablet Take 1 tablet by mouth daily.     nystatin cream (MYCOSTATIN) Apply 1 application topically 2 (two) times daily. 30 g 0   potassium gluconate 595 (99 K) MG TABS tablet Take 595 mg by mouth at bedtime.     telmisartan (MICARDIS) 80 MG tablet TAKE 1 TABLET BY MOUTH EVERY DAY 90 tablet 1   No facility-administered medications prior to visit.     Per HPI unless specifically indicated in ROS section below Review of Systems  Objective:  BP 140/82   Pulse 62   Temp 97.8 F (36.6 C) (Temporal)   Ht 5\' 11"  (1.803 m)   Wt 182 lb 9 oz (82.8 kg)   SpO2 97%   BMI 25.46 kg/m   Wt Readings from Last 3 Encounters:  01/25/21 182 lb 9 oz (82.8 kg)  01/19/21 182 lb 2 oz (82.6 kg)  01/04/21 182 lb (82.6 kg)      Physical Exam Vitals and nursing note reviewed.  Constitutional:      Appearance: Normal appearance. He is not ill-appearing.  Musculoskeletal:        General: No swelling or tenderness. Normal range of motion.     Comments: R index finger dressing removed, area c/d/i without  surrounding erythema or drainage  Neurological:     Mental Status: He is alert.    R index finger wound:  Redressed with nonadherent dressing, tube gauze, and coban wrap. Pt tolerated well. Wound care instructions provided.      Assessment & Plan:  This visit occurred during the SARS-CoV-2 public health emergency.  Safety protocols were in place, including screening questions prior to the visit, additional usage of staff PPE, and extensive cleaning of exam room while observing appropriate contact time as indicated for disinfecting solutions.   Problem List Items Addressed This Visit     Laceration of right index finger    Dressing reapplied. Wound seems to be healing well. Return Friday for wound check.          No orders of the defined types were placed in this encounter.  No orders of the defined  types were placed in this encounter.    Patient Instructions  Return to clinic Friday 12:30pm for wound check  Follow up plan: Return if symptoms worsen or fail to improve.  Ria Bush, MD

## 2021-01-25 NOTE — Assessment & Plan Note (Signed)
Dressing reapplied. Wound seems to be healing well. Return Friday for wound check.

## 2021-01-26 ENCOUNTER — Ambulatory Visit: Payer: Medicare Other | Admitting: Internal Medicine

## 2021-01-27 DIAGNOSIS — L309 Dermatitis, unspecified: Secondary | ICD-10-CM | POA: Diagnosis not present

## 2021-01-28 ENCOUNTER — Encounter: Payer: Self-pay | Admitting: Family Medicine

## 2021-01-28 ENCOUNTER — Other Ambulatory Visit: Payer: Self-pay

## 2021-01-28 ENCOUNTER — Ambulatory Visit (INDEPENDENT_AMBULATORY_CARE_PROVIDER_SITE_OTHER): Payer: Medicare Other | Admitting: Family Medicine

## 2021-01-28 VITALS — BP 140/82 | HR 60 | Temp 97.8°F | Ht 71.0 in | Wt 182.1 lb

## 2021-01-28 DIAGNOSIS — S61210D Laceration without foreign body of right index finger without damage to nail, subsequent encounter: Secondary | ICD-10-CM

## 2021-01-28 NOTE — Assessment & Plan Note (Addendum)
Dressing reapplied. Home wound care reviewed Update if not healing as expected, or streaking redness down finger.  F/u left open ended

## 2021-01-28 NOTE — Patient Instructions (Signed)
Wound is looking good.  Continue daily dressing changes as done today, ensure no streaking redness or draining pus.  May wash with soapy water, pat dry.

## 2021-01-28 NOTE — Progress Notes (Signed)
Patient ID: Michael Decker, male    DOB: 1933-08-19, 87 y.o.   MRN: [id number]  This visit was conducted in person.  BP 140/82   Pulse 60   Temp 97.8 F (36.6 C) (Temporal)   Ht 5\' 11"  (1.803 m)   Wt 182 lb 1 oz (82.6 kg)   SpO2 97%   BMI 25.39 kg/m    CC: finger laceration wound check Subjective:   HPI: [name] is a 87 y.o. male presenting on 01/28/2021 for Wound Check (Here for 2 day f/u.)   See prior note for details.  Wound remains stable without increased pain or streaking redness.  Did not change dressing at home yet.      Relevant past medical, surgical, family and social history reviewed and updated as indicated. Interim medical history since our last visit reviewed. Allergies and medications reviewed and updated. Outpatient Medications Prior to Visit  Medication Sig Dispense Refill   calcium carbonate (TUMS - DOSED IN MG ELEMENTAL CALCIUM) 500 MG chewable tablet Chew 1 tablet by mouth as needed for indigestion or heartburn.     carvedilol (COREG) 3.125 MG tablet Take 1 tablet (3.125 mg total) by mouth 2 (two) times daily. 180 tablet 3   Cyanocobalamin (B-12) 5000 MCG SUBL Place 5,000 mcg under the tongue as needed.     dutasteride (AVODART) 0.5 MG capsule TAKE 1 CAPSULE BY MOUTH EVERY OTHER DAY 45 capsule 1   ELIQUIS 5 MG TABS tablet TAKE 1 TABLET BY MOUTH TWICE DAILY 180 tablet 1   Glucosamine-Chondroitin (OSTEO BI-FLEX REGULAR STRENGTH PO) Take 1 tablet by mouth at bedtime.     Krill Oil 500 MG CAPS Take 1 capsule by mouth daily.      magnesium gluconate (MAGONATE) 500 MG tablet Take 500 mg by mouth daily.     metroNIDAZOLE (METROCREAM) 0.75 % cream SMARTSIG:Sparingly Topical Twice Daily     MISC NATURAL PRODUCTS EX Apply topically. Cannabis cream     Multiple Vitamins-Minerals (MULTIVITAMIN WITH MINERALS) tablet Take 1 tablet by mouth daily.     nystatin cream (MYCOSTATIN) Apply 1 application topically 2 (two) times daily. 30 g 0   potassium gluconate 595  (99 K) MG TABS tablet Take 595 mg by mouth at bedtime.     telmisartan (MICARDIS) 80 MG tablet TAKE 1 TABLET BY MOUTH EVERY DAY 90 tablet 1   No facility-administered medications prior to visit.     Per HPI unless specifically indicated in ROS section below Review of Systems  Objective:  BP 140/82   Pulse 60   Temp 97.8 F (36.6 C) (Temporal)   Ht 5\' 11"  (1.803 m)   Wt 182 lb 1 oz (82.6 kg)   SpO2 97%   BMI 25.39 kg/m   Wt Readings from Last 3 Encounters:  01/28/21 182 lb 1 oz (82.6 kg)  01/25/21 182 lb 9 oz (82.8 kg)  01/19/21 182 lb 2 oz (82.6 kg)      Physical Exam Vitals and nursing note reviewed.  Constitutional:      Appearance: Normal appearance. He is not ill-appearing.  Musculoskeletal:        General: No swelling or tenderness. Normal range of motion.     Comments:  R index finger dressing removed, area c/d/i without surrounding erythema or drainage  Patient washed hand with soapy water, patting dry.  Impregnated gauze first layer removed, smaller piece of gauze overlying tip of index finger remains.  Area dressed with non adherent  dressing and tube gauze.   Skin:    General: Skin is warm and dry.     Findings: No erythema.  Neurological:     Mental Status: He is alert.      Assessment & Plan:  This visit occurred during the SARS-CoV-2 public health emergency.  Safety protocols were in place, including screening questions prior to the visit, additional usage of staff PPE, and extensive cleaning of exam room while observing appropriate contact time as indicated for disinfecting solutions.   Problem List Items Addressed This Visit     Laceration of right index finger - Primary    Dressing reapplied. Home wound care reviewed Update if not healing as expected, or streaking redness down finger.  F/u left open ended         No orders of the defined types were placed in this encounter.  No orders of the defined types were placed in this  encounter.    Patient Instructions  Wound is looking good.  Continue daily dressing changes as done today, ensure no streaking redness or draining pus.  May wash with soapy water, pat dry.   Follow up plan: No follow-ups on file.  Ria Bush, MD

## 2021-02-08 ENCOUNTER — Ambulatory Visit (INDEPENDENT_AMBULATORY_CARE_PROVIDER_SITE_OTHER): Payer: Medicare Other | Admitting: Family Medicine

## 2021-02-08 ENCOUNTER — Encounter: Payer: Self-pay | Admitting: Family Medicine

## 2021-02-08 ENCOUNTER — Other Ambulatory Visit: Payer: Self-pay

## 2021-02-08 VITALS — BP 124/66 | HR 53 | Temp 97.9°F | Ht 71.0 in | Wt 179.5 lb

## 2021-02-08 DIAGNOSIS — I4819 Other persistent atrial fibrillation: Secondary | ICD-10-CM

## 2021-02-08 DIAGNOSIS — I10 Essential (primary) hypertension: Secondary | ICD-10-CM

## 2021-02-08 DIAGNOSIS — S61210D Laceration without foreign body of right index finger without damage to nail, subsequent encounter: Secondary | ICD-10-CM

## 2021-02-08 NOTE — Progress Notes (Signed)
Patient ID: Michael Decker, male    DOB: 1933/10/19, 87 y.o.   MRN: [id number]  This visit was conducted in person.  BP 124/66   Pulse (!) 53   Temp 97.9 F (36.6 C) (Temporal)   Ht 5\' 11"  (1.803 m)   Wt 179 lb 8 oz (81.4 kg)   SpO2 97%   BMI 25.04 kg/m    CC: HTN f/u visit  Subjective:   HPI: Michael Decker is a 87 y.o. male presenting on 02/08/2021 for Follow-up (F/u HTN.  Average BP 136/74)   HTN - Compliant with current antihypertensive regimen of carvedilol 3.125mg  bid, telmisartan 80mg  daily. Does check blood pressures at home and brings log: averaging 136/74, HR 55. Denies low blood pressure readings or symptoms of dizziness/syncope. Denies HA, vision changes, CP/tightness, SOB, leg swelling.   R index finger laceration continues healing well.   Penile rash - saw derm, continues improving with topical steroid (fluticasone ointment 0.005%).  Continues using metrocream for rosacea.   Considering moving to [facility] over the next few years - wife wants to move.      Relevant past medical, surgical, family and social history reviewed and updated as indicated. Interim medical history since our last visit reviewed. Allergies and medications reviewed and updated. Outpatient Medications Prior to Visit  Medication Sig Dispense Refill   acetaminophen (TYLENOL) 650 MG CR tablet Take 1,300 mg by mouth at bedtime.     calcium carbonate (TUMS - DOSED IN MG ELEMENTAL CALCIUM) 500 MG chewable tablet Chew 1 tablet by mouth as needed for indigestion or heartburn.     carvedilol (COREG) 3.125 MG tablet Take 1 tablet (3.125 mg total) by mouth 2 (two) times daily. 180 tablet 3   Cyanocobalamin (B-12) 5000 MCG SUBL Place 5,000 mcg under the tongue as needed.     dutasteride (AVODART) 0.5 MG capsule TAKE 1 CAPSULE BY MOUTH EVERY OTHER DAY 45 capsule 1   ELIQUIS 5 MG TABS tablet TAKE 1 TABLET BY MOUTH TWICE DAILY 180 tablet 1   fluticasone (CUTIVATE) 0.005 % ointment Apply topically 2  (two) times daily as needed.     Glucosamine-Chondroitin (OSTEO BI-FLEX REGULAR STRENGTH PO) Take 1 tablet by mouth at bedtime.     Krill Oil 500 MG CAPS Take 1 capsule by mouth daily.      magnesium gluconate (MAGONATE) 500 MG tablet Take 500 mg by mouth daily.     metroNIDAZOLE (METROCREAM) 0.75 % cream SMARTSIG:Sparingly Topical Twice Daily     MISC NATURAL PRODUCTS EX Apply topically. Cannabis cream     Multiple Vitamins-Minerals (MULTIVITAMIN WITH MINERALS) tablet Take 1 tablet by mouth daily.     potassium gluconate 595 (99 K) MG TABS tablet Take 595 mg by mouth at bedtime.     telmisartan (MICARDIS) 80 MG tablet TAKE 1 TABLET BY MOUTH EVERY DAY 90 tablet 1   nystatin cream (MYCOSTATIN) Apply 1 application topically 2 (two) times daily. 30 g 0   No facility-administered medications prior to visit.     Per HPI unless specifically indicated in ROS section below Review of Systems  Objective:  BP 124/66   Pulse (!) 53   Temp 97.9 F (36.6 C) (Temporal)   Ht 5\' 11"  (1.803 m)   Wt 179 lb 8 oz (81.4 kg)   SpO2 97%   BMI 25.04 kg/m   Wt Readings from Last 3 Encounters:  02/08/21 179 lb 8 oz (81.4 kg)  01/28/21 182 lb 1  oz (82.6 kg)  01/25/21 182 lb 9 oz (82.8 kg)      Physical Exam Vitals and nursing note reviewed.  Constitutional:      Appearance: Normal appearance. He is not ill-appearing.  Cardiovascular:     Rate and Rhythm: Normal rate. Rhythm irregularly irregular.     Pulses: Normal pulses.     Heart sounds: Normal heart sounds. No murmur heard. Pulmonary:     Effort: Pulmonary effort is normal. No respiratory distress.     Breath sounds: Normal breath sounds. No wheezing, rhonchi or rales.  Musculoskeletal:     Right lower leg: No edema.     Left lower leg: No edema.  Skin:    Findings: No laceration.     Comments: Laceration to tip of R index finger has healed well, dry wound present  Neurological:     Mental Status: He is alert.       Assessment & Plan:   This visit occurred during the SARS-CoV-2 public health emergency.  Safety protocols were in place, including screening questions prior to the visit, additional usage of staff PPE, and extensive cleaning of exam room while observing appropriate contact time as indicated for disinfecting solutions.   Problem List Items Addressed This Visit     HTN (hypertension) - Primary    Chronic, stable on current regimen - continue        Persistent atrial fibrillation (HCC)   Laceration of right index finger    This continues healing well without signs of infection.          No orders of the defined types were placed in this encounter.  No orders of the defined types were placed in this encounter.   Patient Instructions  You are doing well today Blood pressures are doing well. Continue current medicines.   Follow up plan: Return if symptoms worsen or fail to improve.  Ria Bush, MD

## 2021-02-08 NOTE — Patient Instructions (Addendum)
You are doing well today Blood pressures are doing well. Continue current medicines.

## 2021-02-08 NOTE — Assessment & Plan Note (Signed)
Chronic, stable on current regimen - continue. 

## 2021-02-08 NOTE — Assessment & Plan Note (Signed)
This continues healing well without signs of infection.

## 2021-02-09 ENCOUNTER — Encounter: Payer: Self-pay | Admitting: Cardiology

## 2021-02-09 ENCOUNTER — Ambulatory Visit: Payer: Medicare Other | Admitting: Cardiology

## 2021-02-09 VITALS — BP 136/70 | HR 55 | Ht 71.0 in | Wt 181.0 lb

## 2021-02-09 DIAGNOSIS — I5022 Chronic systolic (congestive) heart failure: Secondary | ICD-10-CM | POA: Insufficient documentation

## 2021-02-09 DIAGNOSIS — I4821 Permanent atrial fibrillation: Secondary | ICD-10-CM | POA: Diagnosis not present

## 2021-02-09 NOTE — Progress Notes (Signed)
Electrophysiology Office Follow up Visit Note:    Date:  02/09/2021   ID:  Michael Decker, DOB [birth year], MRN [id number]  PCP:  Ria Bush, MD  [facility] Cardiologist:  None  [facility] Electrophysiologist:  Vickie Epley, MD    Interval History:    Michael Decker is a 87 y.o. male who presents for a follow up visit for his permanent atrial fibrillation.  I last saw the patient October 27, 2020.  He is here today with his wife who I have previously met.  They remain very active.  He has no new symptoms.  He is tolerating his medical therapy without off target effects.     Past Medical History:  Diagnosis Date   Arthritis    knee   BPH (benign prostatic hypertrophy)    on avodart, released from urologist care   Colon polyps 2013   TA 2013 Henrene Pastor)   Diverticulosis    GERD (gastroesophageal reflux disease)    Hematospermia    s/p uro eval   HLD (hyperlipidemia)    mild   HTN (hypertension)    Wears hearing aid in left ear     Past Surgical History:  Procedure Laterality Date   BUNIONECTOMY Right 2014   CARDIOVERSION N/A 07/09/2020   Procedure: CARDIOVERSION;  Surgeon: Wellington Hampshire, MD;  Location: [facility];  Service: Cardiovascular;  Laterality: N/A;   CATARACT EXTRACTION W/PHACO Right 08/14/2018   Procedure: CATARACT EXTRACTION PHACO AND INTRAOCULAR LENS PLACEMENT ([place])  RIGHT;  Surgeon: Leandrew Koyanagi, MD;  Location: [place];  Service: Ophthalmology;  Laterality: Right;  requests arrival between 830 & 1030   CATARACT EXTRACTION W/PHACO Left 09/11/2018   Procedure: CATARACT EXTRACTION PHACO AND INTRAOCULAR LENS PLACEMENT ([place])  LEFT;  Surgeon: Leandrew Koyanagi, MD;  Location: [place];  Service: Ophthalmology;  Laterality: Left;  Doesn't want to be early   COLONOSCOPY  2008   COLONOSCOPY  01/2012   TA x1, diverticulosis, rpt 5 yrs Henrene Pastor)   COLONOSCOPY  02/2017   TA, diverticulosis, int hem no f/u needed Henrene Pastor)    CYSTECTOMY     TONSILLECTOMY  1944   UPPER GI ENDOSCOPY      Current Medications: Current Meds  Medication Sig   acetaminophen (TYLENOL) 650 MG CR tablet Take 1,300 mg by mouth at bedtime.   calcium carbonate (TUMS - DOSED IN MG ELEMENTAL CALCIUM) 500 MG chewable tablet Chew 1 tablet by mouth as needed for indigestion or heartburn.   carvedilol (COREG) 3.125 MG tablet Take 1 tablet (3.125 mg total) by mouth 2 (two) times daily.   Cyanocobalamin (B-12) 5000 MCG SUBL Place 5,000 mcg under the tongue as needed.   dutasteride (AVODART) 0.5 MG capsule TAKE 1 CAPSULE BY MOUTH EVERY OTHER DAY   ELIQUIS 5 MG TABS tablet TAKE 1 TABLET BY MOUTH TWICE DAILY   fluticasone (CUTIVATE) 0.005 % ointment Apply topically 2 (two) times daily as needed.   Glucosamine-Chondroitin (OSTEO BI-FLEX REGULAR STRENGTH PO) Take 1 tablet by mouth at bedtime.   Krill Oil 500 MG CAPS Take 1 capsule by mouth daily.    magnesium gluconate (MAGONATE) 500 MG tablet Take 500 mg by mouth daily.   MISC NATURAL PRODUCTS EX Apply topically. Cannabis cream   Multiple Vitamins-Minerals (MULTIVITAMIN WITH MINERALS) tablet Take 1 tablet by mouth daily.   potassium gluconate 595 (99 K) MG TABS tablet Take 595 mg by mouth at bedtime.   telmisartan (MICARDIS) 80 MG tablet TAKE 1 TABLET  BY MOUTH EVERY DAY     Allergies:   Metoprolol   Social History   Socioeconomic History   Marital status: Married    Spouse name: Not on file   Number of children: Not on file   Years of education: Not on file   Highest education level: Not on file  Occupational History   Not on file  Tobacco Use   Smoking status: Former   Smokeless tobacco: Never   Tobacco comments:    cigars back in the 60's  Vaping Use   Vaping Use: Never used  Substance and Sexual Activity   Alcohol use: Yes    Comment: occassionally-beer or wine   Drug use: No   Sexual activity: Yes    Partners: Female  Other Topics Concern   Not on file  Social History  Narrative   Lives with wife   Grown children   Occupation: prior worked with Clinical cytogeneticist as Recruitment consultant; volunteers at [employer] in [place]   Edu: BS   Activity: works out at [employer] 3x/wk, on recumbent bike   Diet: some water, fruits/vegetables daily         Social Determinants of Radio broadcast assistant Strain: Not on file  Food Insecurity: No Food Insecurity   Worried About Charity fundraiser in the Last Year: Never true   Arboriculturist in the Last Year: Never true  Transportation Needs: Not on file  Physical Activity: Not on file  Stress: Not on file  Social Connections: Not on file     Family History: The patient's family history includes Heart disease (age of onset: 76) in his brother; Sudden death (age of onset: 5) in his father. There is no history of Cancer, Diabetes, Stroke, Colon cancer, Esophageal cancer, or Stomach cancer.  ROS:   Please see the history of present illness.    All other systems reviewed and are negative.  EKGs/Labs/Other Studies Reviewed:    The following studies were reviewed today:   EKG:  The ekg ordered today demonstrates atrial fibrillation with a slow ventricular response  Recent Labs: 05/25/2020: ALT 19; TSH 1.05 07/07/2020: BUN 22; Creatinine, Ser 1.14; Hemoglobin 15.2; Platelets 209; Potassium 4.5; Sodium 137  Recent Lipid Panel    Component Value Date/Time   CHOL 159 05/25/2020 1245   TRIG 90.0 05/25/2020 1245   TRIG 205 06/17/2013 0000   HDL 65.90 05/25/2020 1245   CHOLHDL 2 05/25/2020 1245   VLDL 18.0 05/25/2020 1245   LDLCALC 76 05/25/2020 1245   LDLCALC 82 06/17/2013 0000    Physical Exam:    VS:  BP 136/70 (BP Location: Left Arm, Patient Position: Sitting, Cuff Size: Normal)   Pulse (!) 55   Ht 5' 11"  (1.803 m)   Wt 181 lb (82.1 kg)   SpO2 97%   BMI 25.24 kg/m     Wt Readings from Last 3 Encounters:  02/09/21 181 lb (82.1 kg)  02/08/21 179 lb 8 oz (81.4 kg)  01/28/21 182 lb 1 oz (82.6 kg)      GEN:  Well nourished, well developed in no acute distress.  Appears younger than stated age 18: Normal NECK: No JVD; No carotid bruits LYMPHATICS: No lymphadenopathy CARDIAC: Irregularly irregular, no murmurs, rubs, gallops RESPIRATORY:  Clear to auscultation without rales, wheezing or rhonchi  ABDOMEN: Soft, non-tender, non-distended MUSCULOSKELETAL:  No edema; No deformity.  Orthopedic brace on left knee SKIN: Warm and dry NEUROLOGIC:  Alert and oriented  x 3 PSYCHIATRIC:  Normal affect   ASSESSMENT:    1. Permanent atrial fibrillation ([place])   2. Chronic systolic heart failure (HCC)    PLAN:    In order of problems listed above:  1.  Permanent atrial fibrillation (HCC) Rate controlled.  On Eliquis for stroke prophylaxis.  2. Chronic systolic heart failure (HCC) NYHA class I.  Warm and dry.  Very mild left ventricular dysfunction on December 2021 echo at 45%.  Continue telmisartan and carvedilol.  Follow-up 1 year or sooner as needed.  Okay to follow-up with NP/PA.   Medication Adjustments/Labs and Tests Ordered: Current medicines are reviewed at length with the patient today.  Concerns regarding medicines are outlined above.  Orders Placed This Encounter  Procedures   EKG 12-Lead   No orders of the defined types were placed in this encounter.    Signed, Lars Mage, MD, [facility], [facility] 02/09/2021 5:08 PM    Electrophysiology [facility]

## 2021-02-09 NOTE — Patient Instructions (Addendum)
Medication Instructions:  Your physician recommends that you continue on your current medications as directed. Please refer to the Current Medication list given to you today. *If you need a refill on your cardiac medications before your next appointment, please call your pharmacy*  Lab Work: None ordered. If you have labs (blood work) drawn today and your tests are completely normal, you will receive your results only by: [place] (if you have MyChart) OR A paper copy in the mail If you have any lab test that is abnormal or we need to change your treatment, we will call you to review the results.  Testing/Procedures: None ordered.  Follow-Up: At [facility], you and your health needs are our priority.  As part of our continuing mission to provide you with exceptional heart care, we have created designated Provider Care Teams.  These Care Teams include your primary Cardiologist (physician) and Advanced Practice Providers (APPs -  Physician Assistants and Nurse Practitioners) who all work together to provide you with the care you need, when you need it.  Your next appointment:   Your physician wants you to follow-up in: one year with [place] [facility].   You will receive a reminder letter in the mail two months in advance. If you don't receive a letter, please call our office to schedule the follow-up appointment.

## 2021-02-16 DIAGNOSIS — L570 Actinic keratosis: Secondary | ICD-10-CM | POA: Diagnosis not present

## 2021-02-16 DIAGNOSIS — L249 Irritant contact dermatitis, unspecified cause: Secondary | ICD-10-CM | POA: Diagnosis not present

## 2021-02-22 IMAGING — MR MR LUMBAR SPINE W/O CM
4 of 5 series · 18 of 48 positions shown · non-contrast
Comparison: 12/19/2019 lumbar spine radiographs.

CLINICAL DATA: Low back pain for 4 months.

EXAM:
MRI LUMBAR SPINE WITHOUT CONTRAST
TECHNIQUE: Multiplanar, multisequence MR imaging of the lumbar spine was
performed. No intravenous contrast was administered.

[Series 6: T2 · sagittal · 4.0mm · 0.73mm/px · 6 of 15 slices shown (1 of 2)]
[im 1/15]
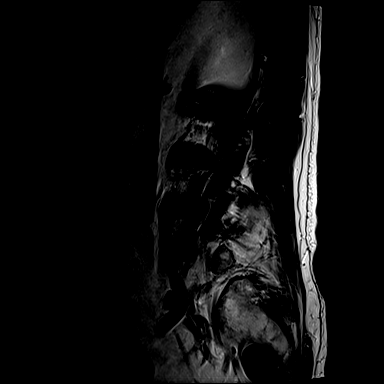
[im 3/15]
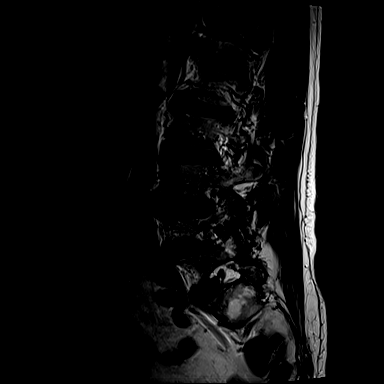
[im 6/15]
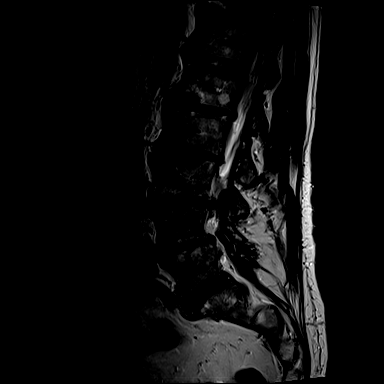
[im 9/15]
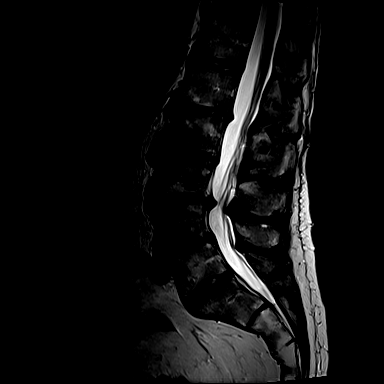
[im 12/15]
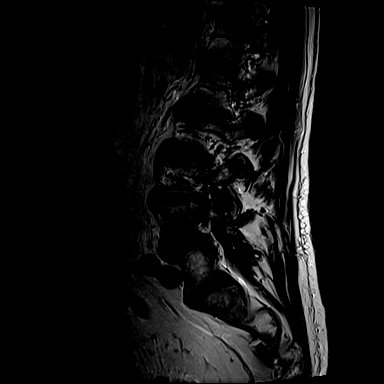
[im 15/15]
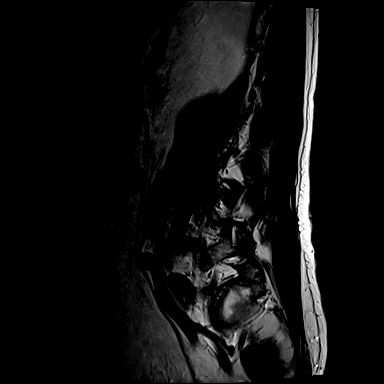

[Series 7: T1 · sagittal · 4.0mm · 0.73mm/px · 3 of 15 slices shown (1 of 2)]
[im 3/15]
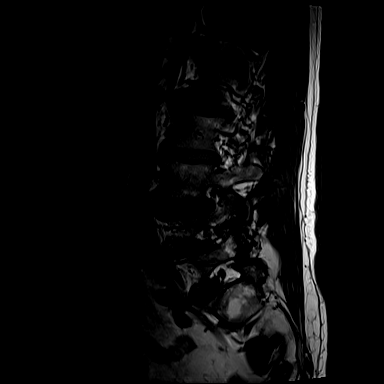
[im 9/15]
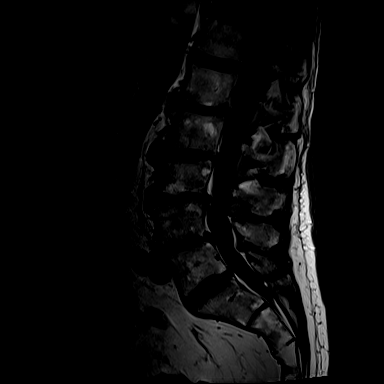
[im 15/15]
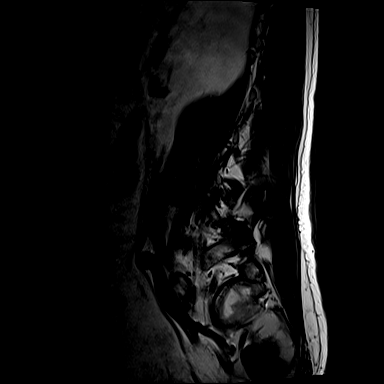

[Series 11: T1 · axial · 4.0mm · 0.28mm/px · z∈[+5,+170]mm · 3 of 40 slices shown (2 of 2)]
[im 6/40]
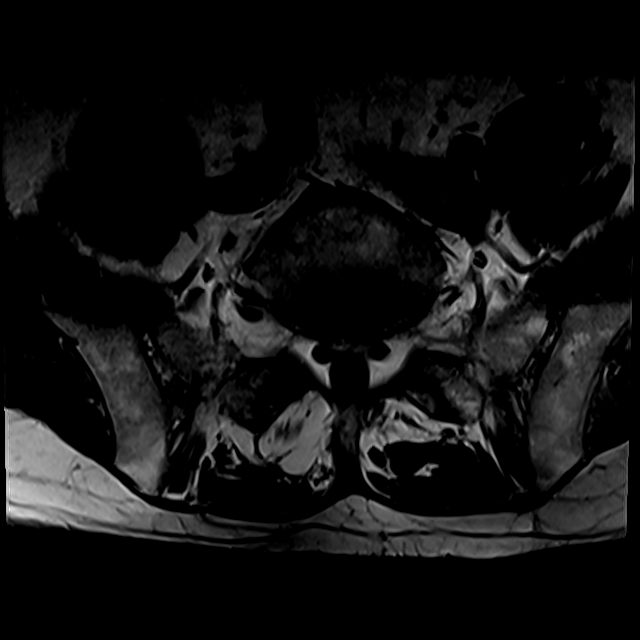
[im 20/40]
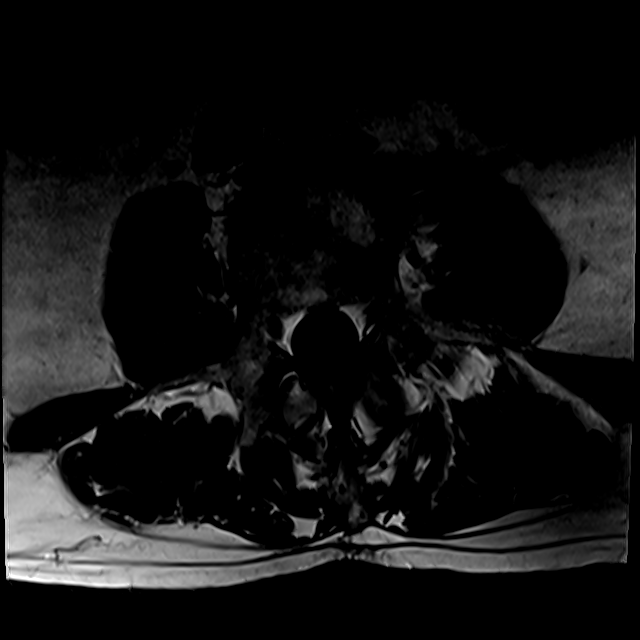
[im 34/40]
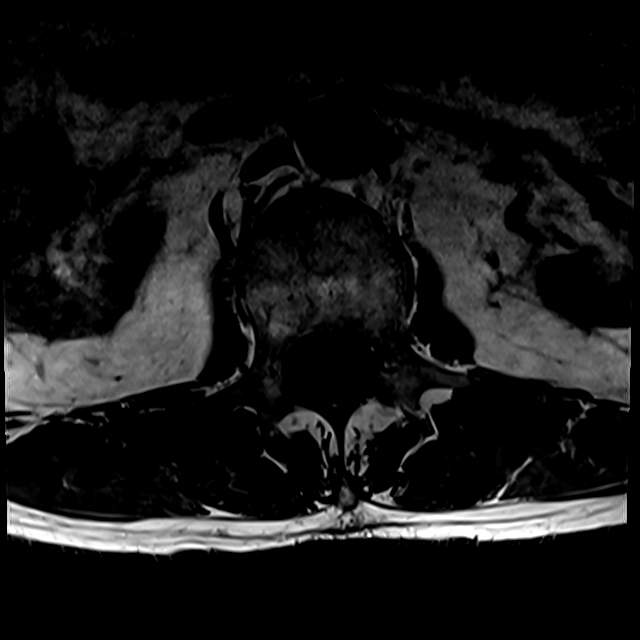

[Series 14: T2 · axial · 4.0mm · 0.28mm/px · z∈[-20,+170]mm · 6 of 40 slices shown (2 of 2)]
[im 1/40]
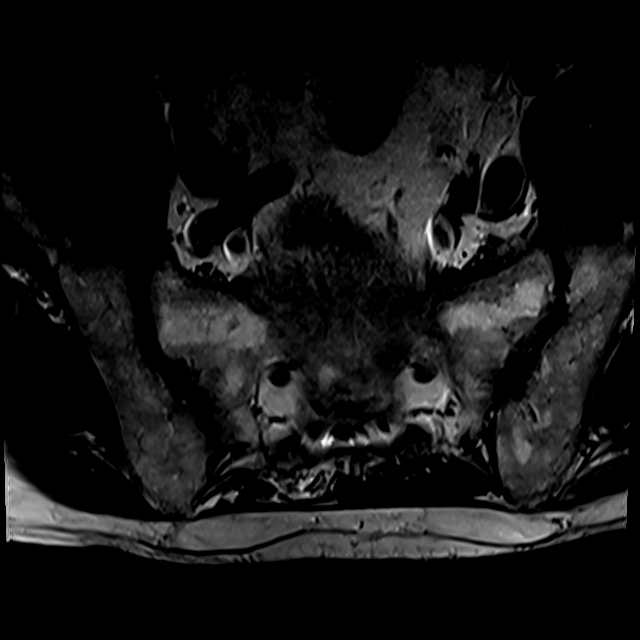
[im 6/40]
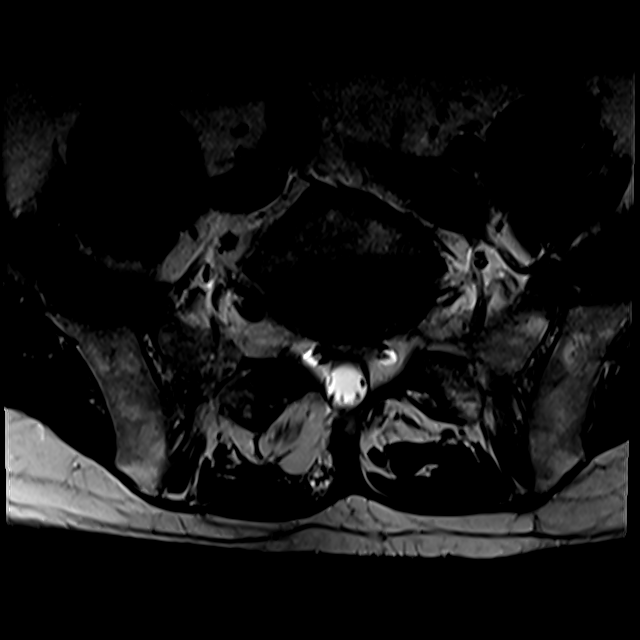
[im 12/40]
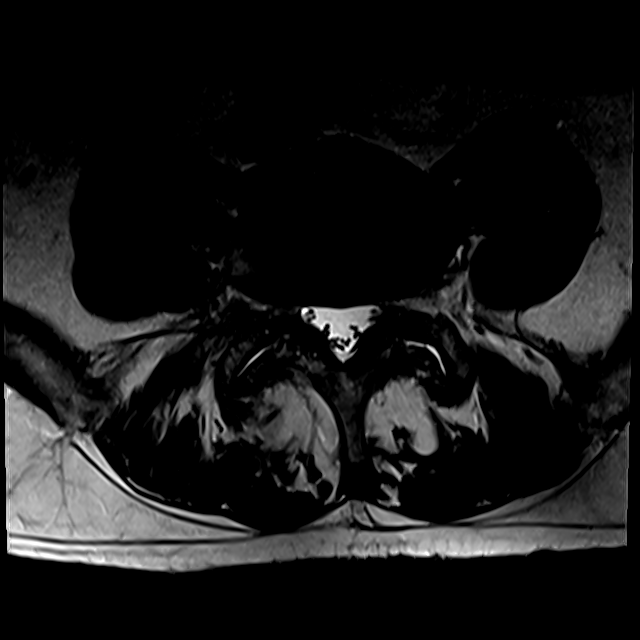
[im 17/40]
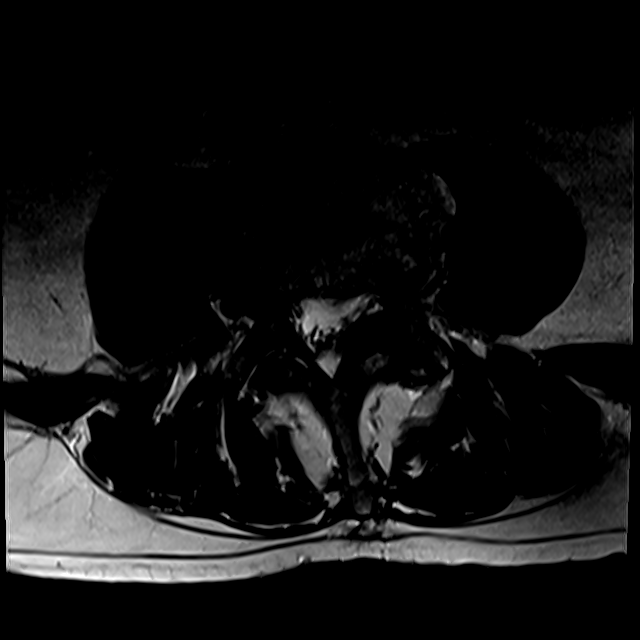
[im 20/40]
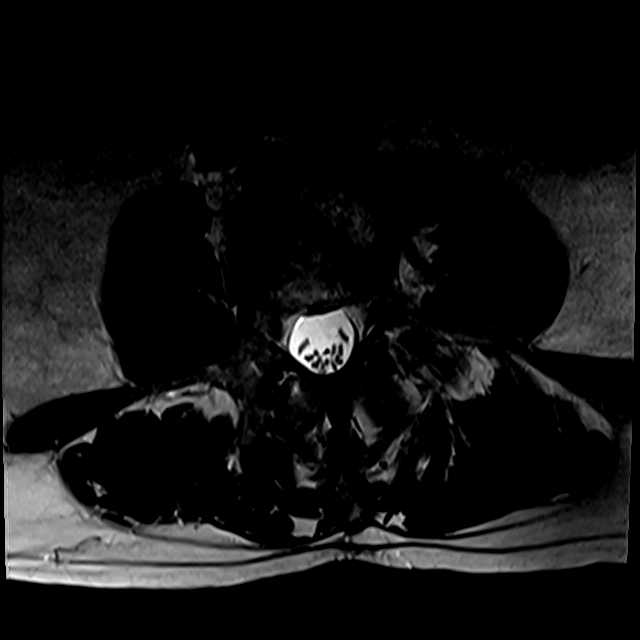
[im 34/40]
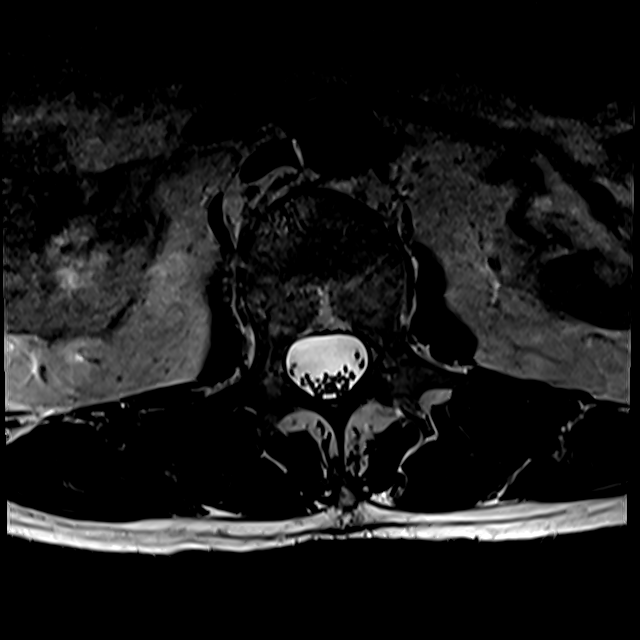

[18 of 48 positions shown; findings below may reference images not displayed]

FINDINGS: Segmentation:  Standard.

Alignment:  Minimal grade 1 L4-5 anterolisthesis.

Vertebrae: Multifocal hemangiomata. Multilevel Modic type 1 and 2
endplate degenerative changes with osteophytosis and Schmorl's node
formation. Vertebral body heights are preserved.

Conus medullaris and cauda equina: Conus extends to the L1 level.
Conus and cauda equina appear normal.

Disc levels: Multilevel desiccation with moderate L1-2, L3-4 and
L4-5 disc space loss.

L1-2: Disc bulge with superimposed left greater than right foraminal
protrusions and bilateral facet hypertrophy. Mild right and moderate
left neural foraminal narrowing.

L2-3: Disc bulge with superimposed left greater than right foraminal
protrusions and bilateral facet hypertrophy. Mild right and moderate
left neural foraminal narrowing.

L3-4: Disc bulge with superimposed left paracentral and left
extraforaminal protrusions ([DATE]) with effacement of the left
lateral recess. Anteriorly projecting 0.8 cm synovial cyst arising
from the left facet joint ([DATE]). Prominent dorsal epidural fat.
Moderate spinal canal, severe left and moderate right neural
foraminal narrowing.

L4-5: Disc bulge with superimposed central protrusion, ligamentum
flavum and bilateral facet hypertrophy. Mild spinal canal and
bilateral neural foraminal narrowing.

L5-S1: Disc bulge with superimposed central protrusion and bilateral
facet hypertrophy. No significant spinal canal or neural foraminal
narrowing.

Paraspinal and other soft tissues: Within normal limits.
IMPRESSION: Multilevel spondylosis. Moderate spinal canal and moderate to severe
neural foraminal narrowing at the L3-4 level, further detailed
above.

Moderate left L1-2 and L2-3 neural foraminal narrowing.

## 2021-03-19 ENCOUNTER — Other Ambulatory Visit: Payer: Self-pay | Admitting: Family Medicine

## 2021-03-22 NOTE — Telephone Encounter (Signed)
E-scribed refill.  Plz schedule wellness, lab and cpe visits.  

## 2021-03-29 ENCOUNTER — Telehealth: Payer: Self-pay

## 2021-03-29 NOTE — Chronic Care Management (AMB) (Addendum)
    Chronic Care Management Pharmacy Assistant   Name: Michael Decker  MRN: [id number] DOB: 1933-10-16   Reason for Encounter: Reminder Call   Conditions to be addressed/monitored: HTN and HLD   Medications: Outpatient Encounter Medications as of 03/29/2021  Medication Sig   acetaminophen (TYLENOL) 650 MG CR tablet Take 1,300 mg by mouth at bedtime.   calcium carbonate (TUMS - DOSED IN MG ELEMENTAL CALCIUM) 500 MG chewable tablet Chew 1 tablet by mouth as needed for indigestion or heartburn.   carvedilol (COREG) 3.125 MG tablet Take 1 tablet (3.125 mg total) by mouth 2 (two) times daily.   Cyanocobalamin (B-12) 5000 MCG SUBL Place 5,000 mcg under the tongue as needed.   dutasteride (AVODART) 0.5 MG capsule TAKE 1 CAPSULE BY MOUTH EVERY OTHER DAY   ELIQUIS 5 MG TABS tablet TAKE 1 TABLET BY MOUTH TWICE DAILY   fluticasone (CUTIVATE) 0.005 % ointment Apply topically 2 (two) times daily as needed.   Glucosamine-Chondroitin (OSTEO BI-FLEX REGULAR STRENGTH PO) Take 1 tablet by mouth at bedtime.   Krill Oil 500 MG CAPS Take 1 capsule by mouth daily.    magnesium gluconate (MAGONATE) 500 MG tablet Take 500 mg by mouth daily.   MISC NATURAL PRODUCTS EX Apply topically. Cannabis cream   Multiple Vitamins-Minerals (MULTIVITAMIN WITH MINERALS) tablet Take 1 tablet by mouth daily.   potassium gluconate 595 (99 K) MG TABS tablet Take 595 mg by mouth at bedtime.   telmisartan (MICARDIS) 80 MG tablet TAKE 1 TABLET BY MOUTH EVERY DAY   No facility-administered encounter medications on file as of 03/29/2021.   [name]  did not answer the telephone to remind him of his upcoming telephone visit with Debbora Dus on 04/04/21 at 1:00pm. Patient was reminded to have all medications, supplements and any blood glucose and blood pressure readings available for review at appointment. Left voicemail.    Star Rating Drugs: Medication:  Last Fill: Day Supply Telmisartan '80mg'$  02/3021  90  Debbora Dus,  CPP notified  Avel Sensor, [place] Assistant [phone]  I have reviewed the care management and care coordination activities outlined in this encounter and I am certifying that I agree with the content of this note. No further action required.  Debbora Dus, PharmD Clinical Pharmacist [facility] [phone]

## 2021-04-04 ENCOUNTER — Telehealth: Payer: PPO

## 2021-04-04 ENCOUNTER — Other Ambulatory Visit: Payer: Self-pay

## 2021-04-04 ENCOUNTER — Ambulatory Visit (INDEPENDENT_AMBULATORY_CARE_PROVIDER_SITE_OTHER): Payer: Medicare Other

## 2021-04-04 DIAGNOSIS — I10 Essential (primary) hypertension: Secondary | ICD-10-CM

## 2021-04-04 DIAGNOSIS — I4819 Other persistent atrial fibrillation: Secondary | ICD-10-CM

## 2021-04-04 NOTE — Progress Notes (Signed)
Chronic Care Management Pharmacy Note  04/04/2021 Name:  Michael Decker MRN:  [id number] DOB:  1933-11-22  Summary: No medications concerns identified. Patient is adherent. Meds affordable. HTN stable.  Recommendations/Changes made from today's visit: None  Plan: CCM follow up 12 months or PRN   Subjective: Michael Decker is an 87 y.o. year old male who is a primary patient of Ria Bush, MD.  The CCM team was consulted for assistance with disease management and care coordination needs.    Engaged with patient by telephone for follow up visit in response to provider referral for pharmacy case management and/or care coordination services.   Consent to Services:  The patient was given information about Chronic Care Management services, agreed to services, and gave verbal consent prior to initiation of services.  Please see initial visit note for detailed documentation.   Patient Care Team: Ria Bush, MD as PCP - General (Family Medicine) Vickie Epley, MD as PCP - Electrophysiology (Cardiology) Druscilla Brownie, MD as Consulting Physician (Dermatology) Raynelle Bring, MD as Consulting Physician (Urology) Elvina Mattes, Adele Schilder as Attending Physician (Podiatry) Leandrew Koyanagi, MD as Referring Physician (Ophthalmology) Irene Shipper, MD as Consulting Physician (Gastroenterology) Burnice Logan, [facility] as Pharmacist (Pharmacist)  Recent office visits: 02/08/21 - PCP - HTN, stable home BP averaging < 140/90. Continue current medications.   Recent consult visits: 02/09/21 - Cardiology - CHF, AFIB, stable on current therapy. Follow up 1 year.  Hospital visits: 01/22/21 - ED, Laceration of hand    Objective:  Lab Results  Component Value Date   CREATININE 1.14 07/07/2020   BUN 22 07/07/2020   GFR 55.68 (L) 05/25/2020   GFRNONAA >60 07/07/2020   NA 137 07/07/2020   K 4.5 07/07/2020   CALCIUM 9.3 07/07/2020   CO2 29 07/07/2020   GLUCOSE 88 07/07/2020     Lab Results  Component Value Date/Time   GFR 55.68 (L) 05/25/2020 12:45 PM   GFR 58.92 (L) 07/07/2019 09:09 AM     Lab Results  Component Value Date   CHOL 159 05/25/2020   HDL 65.90 05/25/2020   LDLCALC 76 05/25/2020   TRIG 90.0 05/25/2020   CHOLHDL 2 05/25/2020    Hepatic Function Latest Ref Rng & Units 05/25/2020 07/07/2019 07/02/2018  Total Protein 6.0 - 8.3 g/dL 6.4 6.0 6.0  Albumin 3.5 - 5.2 g/dL 3.8 3.7 3.8  AST 0 - 37 U/L _0 ALT 0 - 53 U/L _1 Alk Phosphatase 39 - 117 U/L 62 63 61  Total Bilirubin 0.2 - 1.2 mg/dL 0.9 0.8 0.7    Lab Results  Component Value Date/Time   TSH 1.05 05/25/2020 12:45 PM   TSH 1.28 04/20/2017 10:45 AM    CBC Latest Ref Rng & Units 07/07/2020 05/25/2020 04/20/2017  WBC 4.0 - 10.5 K/uL 7.6 6.6 7.5  Hemoglobin 13.0 - 17.0 g/dL 15.2 15.2 14.4  Hematocrit 39.0 - 52.0 % 45.1 44.5 43.1  Platelets 150 - 400 K/uL 209 200.0 209.0    No results found for: VD25OH  Clinical ASCVD: No  The ASCVD Risk score (Arnett DK, et al., 2019) failed to calculate for the following reasons:   The 2019 ASCVD risk score is only valid for ages 65 to 42    Depression screen PHQ 2/9 07/14/2020 07/14/2019 07/10/2018  Decreased Interest 0 0 0  Down, Depressed, Hopeless 0 0 0  PHQ - 2 Score 0 0 0     Social History  Tobacco Use  Smoking Status Former  Smokeless Tobacco Never  Tobacco Comments   cigars back in the 60's   BP Readings from Last 3 Encounters:  02/09/21 136/70  02/08/21 124/66  01/28/21 140/82   Pulse Readings from Last 3 Encounters:  02/09/21 (!) 55  02/08/21 (!) 53  01/28/21 60   Wt Readings from Last 3 Encounters:  02/09/21 181 lb (82.1 kg)  02/08/21 179 lb 8 oz (81.4 kg)  01/28/21 182 lb 1 oz (82.6 kg)   BMI Readings from Last 3 Encounters:  02/09/21 25.24 kg/m  02/08/21 25.04 kg/m  01/28/21 25.39 kg/m    Assessment/Interventions: Review of patient past medical history, allergies, medications, health  status, including review of consultants reports, laboratory and other test data, was performed as part of comprehensive evaluation and provision of chronic care management services.   SDOH:  (Social Determinants of Health) assessments and interventions performed: Yes SDOH Interventions    Flowsheet Row Most Recent Value  SDOH Interventions   Financial Strain Interventions Intervention Not Indicated      SDOH Screenings   Alcohol Screen: Not on file  Depression (PHQ2-9): Low Risk    PHQ-2 Score: 0  Financial Resource Strain: Low Risk    Difficulty of Paying Living Expenses: Not very hard  Food Insecurity: No Food Insecurity   Worried About Charity fundraiser in the Last Year: Never true   Ran Out of Food in the Last Year: Never true  Housing: [place] Risk Score: 0  Physical Activity: Not on file  Social Connections: Not on file  Stress: Not on file  Tobacco Use: Medium Risk   Smoking Tobacco Use: Former   Smokeless Tobacco Use: Never  Transportation Needs: Not on file    [place]  Allergies  Allergen Reactions   Metoprolol Rash    Medications Reviewed Today     Reviewed by Debbora Dus, [facility] (Pharmacist) on 04/04/21 at [place] List Status: <None>   Medication Order Taking? Sig Documenting Provider Last Dose Status Informant  acetaminophen (TYLENOL) 650 MG CR tablet [id number] Yes Take 1,300 mg by mouth at bedtime. [provider] Taking Active   calcium carbonate (TUMS - DOSED IN MG ELEMENTAL CALCIUM) 500 MG chewable tablet [id number] Yes Chew 1 tablet by mouth as needed for indigestion or heartburn. [provider] Taking Active Self  carvedilol (COREG) 3.125 MG tablet [id number] Yes Take 1 tablet (3.125 mg total) by mouth 2 (two) times daily. Vickie Epley, MD Taking Active   Cyanocobalamin (B-12) 5000 MCG SUBL [id number] Yes Place 5,000 mcg under the tongue as needed. [provider] Taking Active Self   dutasteride (AVODART) 0.5 MG capsule [id number] Yes TAKE 1 CAPSULE BY MOUTH EVERY OTHER DAY Ria Bush, MD Taking Active   ELIQUIS 5 MG TABS tablet [id number] Yes TAKE 1 TABLET BY MOUTH TWICE DAILY Vickie Epley, MD Taking Active   Glucosamine-Chondroitin (OSTEO BI-FLEX REGULAR STRENGTH PO) [id number] Yes Take 1 tablet by mouth at bedtime. [provider] Taking Active Self  Krill Oil 500 MG CAPS [id number] Yes Take 1 capsule by mouth daily.  [provider] Taking Active Self  magnesium gluconate (MAGONATE) 500 MG tablet [id number] Yes Take 500 mg by mouth daily. [provider] Taking Active Self  MISC NATURAL PRODUCTS EX [id number] Yes Apply topically. Cannabis cream [provider] Taking Active Self  Multiple Vitamins-Minerals (MULTIVITAMIN WITH MINERALS) tablet [id number] Yes Take 1 tablet by  mouth daily. [provider] Taking Active Self  potassium gluconate 595 (99 K) MG TABS tablet [id number] Yes Take 595 mg by mouth at bedtime. [provider] Taking Active Self  telmisartan (MICARDIS) 80 MG tablet [id number] Yes TAKE 1 TABLET BY MOUTH EVERY DAY Ria Bush, MD Taking Active             Patient Active Problem List   Diagnosis Date Noted   Chronic systolic heart failure ([place]) 02/09/2021   Laceration of right index finger 01/25/2021   Penile rash 01/19/2021   Forearm mass, left 10/29/2020   Dizziness 05/25/2020   Persistent atrial fibrillation ([place]) 05/25/2020   Iron excess 06/28/2017   Peripheral neuropathy 06/08/2017   Arthritis of left knee 04/16/2017   Health maintenance examination 07/06/2016   Leg cramping 07/06/2016   Low back pain 02/23/2015   Medicare annual wellness visit, subsequent 06/23/2014   Advanced care planning/counseling discussion 06/23/2014   Rhinorrhea 06/23/2014   HTN (hypertension)    HLD (hyperlipidemia)    GERD (gastroesophageal reflux disease)    Benign prostatic hyperplasia     Colon polyps     Immunization History  Administered Date(s) Administered   Fluad Quad(high Dose 65+) 04/08/2019   Influenza Inj Mdck Quad Pf 05/02/2018   Influenza, High Dose Seasonal PF 05/04/2017, 05/14/2020   Influenza,inj,Quad PF,6+ Mos 04/29/2014, 05/14/2015, 05/02/2016   Influenza-Unspecified 05/09/2013   Moderna Sars-Covid-2 Vaccination 09/09/2019, 10/07/2019, 05/22/2020   Pneumococcal Conjugate-13 06/23/2014   Pneumococcal Polysaccharide-23 03/24/2001, 06/28/2016   Tdap 05/11/2009, 01/22/2021   Zoster, Live 06/12/2012    Conditions to be addressed/monitored:  Hypertension and Atrial Fibrillation  Care Plan : [place]  Updates made by Debbora Dus, [facility] since 04/04/2021 12:00 AM     Problem: Disease Management   Priority: High  Onset Date: 04/04/2021  Note:   Current Barriers:  None identified  Pharmacist Clinical Goal(s):  Patient will contact provider office for questions/concerns as evidenced notation of same in electronic health record through collaboration with PharmD and provider.   Interventions: 1:1 collaboration with Ria Bush, MD regarding development and update of comprehensive plan of care as evidenced by provider attestation and co-signature Inter-disciplinary care team collaboration (see longitudinal plan of care) Comprehensive medication review performed; medication list updated in electronic medical record  Hypertension (BP goal <140/90) -Controlled - per clinic readings  -Current treatment: Telmisartan 80 mg - 1 tablet daily Carvedilol 3.125 mg - 1 tablet twice daily -Medications previously tried: none   -Current home readings: none reported, recently reported home readings to Dr. Darnell Level average around 135/70 -Current dietary habits: did not discuss -Current exercise habits: goes to [facility] 3 days per week, bike riding, weight lifting  -Denies hypotensive/hypertensive symptoms -Educated on BP goals and benefits of medications for  prevention of heart attack, stroke and kidney damage; -Counseled to monitor BP at home with abnormal symptoms, document, and provide log at future appointments -Recommended to continue current medication  Atrial Fibrillation (Goal: prevent stroke and major bleeding) -Controlled - denies any symptoms -CHADSVASC: 3-4 -Current treatment: Rate control: Carvedilol 3.125 mg - 1 tablet twice daily Anticoagulation: Eliquis 5 mg - 1 tablet twice daily  -Medications previously tried: none -Home BP and HR readings: controlled  -Counseled on increased risk of stroke due to Afib and benefits of anticoagulation for stroke prevention; importance of adherence to anticoagulant exactly as prescribed; avoidance of NSAIDs due to increased bleeding risk with anticoagulants; -Recommended to continue current medication  Patient Goals/Self-Care Activities Patient will:  -  focus on medication adherence by continuing to use a pill box   Follow Up Plan: Telephone follow up appointment with care management team member scheduled for: 12 months or PRN     Medication Assistance: None required.  Patient affirms current coverage meets needs.  Compliance/Adherence/Medication fill history: Care Gaps: None   Star-Rating Drugs: Telmisartan 80 mg - 03/22/21 90 DS Eliquis 5 mg - 01/31/21 90 DS  Patient's preferred pharmacy is: [id number] [id number]: [phone]: [phone] [facility]   Uses pill box? Yes Pt endorses 95% compliance - occasionally misses evening meds  Care Plan and Follow Up Patient Decision:  Patient agrees to Care Plan and Follow-up.  MDebbora Dus PharmD Clinical Pharmacist [facility]ail[facility]Primary [facility] at [phone]

## 2021-04-04 NOTE — Patient Instructions (Signed)
Dear Michael Decker,  Below is a summary of the goals we discussed during our follow up appointment on April 04, 2021. Please contact me anytime with questions or concerns.   Visit Information  Patient Care Plan: CCM Pharmacy Care Plan     Problem Identified: Disease Management   Priority: High  Onset Date: 04/04/2021  Note:   Current Barriers:  None identified  Pharmacist Clinical Goal(s):  Patient will contact provider office for questions/concerns as evidenced notation of same in electronic health record through collaboration with PharmD and provider.   Interventions: 1:1 collaboration with Ria Bush, MD regarding development and update of comprehensive plan of care as evidenced by provider attestation and co-signature Inter-disciplinary care team collaboration (see longitudinal plan of care) Comprehensive medication review performed; medication list updated in electronic medical record  Hypertension (BP goal <140/90) -Controlled - per clinic readings  -Current treatment: Telmisartan 80 mg - 1 tablet daily Carvedilol 3.125 mg - 1 tablet twice daily -Medications previously tried: none   -Current home readings: none reported, recently reported home readings to Dr. Darnell Level average around 135/70 -Current dietary habits: did not discuss -Current exercise habits: goes to [facility] 3 days per week, bike riding, weight lifting  -Denies hypotensive/hypertensive symptoms -Educated on BP goals and benefits of medications for prevention of heart attack, stroke and kidney damage; -Counseled to monitor BP at home with abnormal symptoms, document, and provide log at future appointments -Recommended to continue current medication  Atrial Fibrillation (Goal: prevent stroke and major bleeding) -Controlled - denies any symptoms -CHADSVASC: 3-4 -Current treatment: Rate control: Carvedilol 3.125 mg - 1 tablet twice daily Anticoagulation: Eliquis 5 mg - 1 tablet twice daily  -Medications  previously tried: none -Home BP and HR readings: controlled  -Counseled on increased risk of stroke due to Afib and benefits of anticoagulation for stroke prevention; importance of adherence to anticoagulant exactly as prescribed; avoidance of NSAIDs due to increased bleeding risk with anticoagulants; -Recommended to continue current medication  Patient Goals/Self-Care Activities Patient will:  - focus on medication adherence by continuing to use a pill box   Follow Up Plan: Telephone follow up appointment with care management team member scheduled for: 12 months or PRN      Patient verbalizes understanding of instructions provided today and agrees to view in [place].   Debbora Dus, PharmD Clinical Pharmacist [facility] [phone]

## 2021-04-06 ENCOUNTER — Telehealth: Payer: PPO

## 2021-04-13 ENCOUNTER — Ambulatory Visit: Payer: PPO | Admitting: Urology

## 2021-04-21 ENCOUNTER — Encounter: Payer: Self-pay | Admitting: Urology

## 2021-04-21 ENCOUNTER — Other Ambulatory Visit: Payer: Self-pay

## 2021-04-21 ENCOUNTER — Ambulatory Visit: Payer: Medicare Other | Admitting: Urology

## 2021-04-21 VITALS — BP 164/78 | HR 51 | Ht 71.0 in | Wt 173.0 lb

## 2021-04-21 DIAGNOSIS — R310 Gross hematuria: Secondary | ICD-10-CM

## 2021-04-21 DIAGNOSIS — N401 Enlarged prostate with lower urinary tract symptoms: Secondary | ICD-10-CM | POA: Diagnosis not present

## 2021-04-21 DIAGNOSIS — R361 Hematospermia: Secondary | ICD-10-CM

## 2021-04-21 NOTE — Progress Notes (Signed)
04/21/2021 10:10 AM   [name] Jun 10, 1934 [id number]  Referring provider: Ria Bush, MD [address] [place],  [place] [address]  Chief Complaint  Patient presents with   Other    HPI: Michael Decker is an 87 y.o. male who presents for evaluation of prostate problems.  Not sexually active but does masturbate and within the last month on 2 occasions has noted rust colored streaks in his semen Long history of BPH on Avodart and I saw him at my former practice in [place].  Last seen in 2014 for BPH, gross hematuria and hematospermia.  Cystoscopy at that time remarkable for BPH and he was started on Avodart which he continues to take every other day No bothersome LUTS Last PSA 06/2019 0.56 Saw Dr. Pilar Jarvis 2019 for urinary frequency Does relate to an episode of total gross painless hematuria ~ 1 year ago.  Urine described as bright red.  He states on his next void the urine was clear and a subsequent void later that day was also bright red.  No other recurrent episodes On Eliquis   PMH: Past Medical History:  Diagnosis Date   Arthritis    knee   BPH (benign prostatic hypertrophy)    on avodart, released from urologist care   Colon polyps 2013   TA 2013 Henrene Pastor)   Diverticulosis    GERD (gastroesophageal reflux disease)    Hematospermia    s/p uro eval   HLD (hyperlipidemia)    mild   HTN (hypertension)    Wears hearing aid in left ear     Surgical History: Past Surgical History:  Procedure Laterality Date   BUNIONECTOMY Right 2014   CARDIOVERSION N/A 07/09/2020   Procedure: CARDIOVERSION;  Surgeon: Wellington Hampshire, MD;  Location: [facility];  Service: Cardiovascular;  Laterality: N/A;   CATARACT EXTRACTION W/PHACO Right 08/14/2018   Procedure: CATARACT EXTRACTION PHACO AND INTRAOCULAR LENS PLACEMENT ([place])  RIGHT;  Surgeon: Leandrew Koyanagi, MD;  Location: [place];  Service: Ophthalmology;  Laterality: Right;  requests arrival between  830 & 1030   CATARACT EXTRACTION W/PHACO Left 09/11/2018   Procedure: CATARACT EXTRACTION PHACO AND INTRAOCULAR LENS PLACEMENT ([place])  LEFT;  Surgeon: Leandrew Koyanagi, MD;  Location: [facility];  Service: Ophthalmology;  Laterality: Left;  Doesn't want to be early   COLONOSCOPY  2008   COLONOSCOPY  01/2012   TA x1, diverticulosis, rpt 5 yrs Henrene Pastor)   COLONOSCOPY  02/2017   TA, diverticulosis, int hem no f/u needed Henrene Pastor)   CYSTECTOMY     TONSILLECTOMY  1944   UPPER GI ENDOSCOPY      Home Medications:  Allergies as of 04/21/2021       Reactions   Metoprolol Rash        Medication List        Accurate as of April 21, 2021 10:10 AM. If you have any questions, ask your nurse or doctor.          acetaminophen 650 MG CR tablet Commonly known as: TYLENOL Take 1,300 mg by mouth at bedtime.   B-12 5000 MCG Subl Place 5,000 mcg under the tongue as needed.   calcium carbonate 500 MG chewable tablet Commonly known as: TUMS - dosed in mg elemental calcium Chew 1 tablet by mouth as needed for indigestion or heartburn.   carvedilol 3.125 MG tablet Commonly known as: COREG Take 1 tablet (3.125 mg total) by mouth 2 (two) times daily.   dutasteride 0.5  MG capsule Commonly known as: AVODART TAKE 1 CAPSULE BY MOUTH EVERY OTHER DAY   Eliquis 5 MG Tabs tablet Generic drug: apixaban TAKE 1 TABLET BY MOUTH TWICE DAILY   Krill Oil 500 MG Caps Take 1 capsule by mouth daily.   magnesium gluconate 500 MG tablet Commonly known as: MAGONATE Take 500 mg by mouth daily.   MISC NATURAL PRODUCTS EX Apply topically. Cannabis cream   multivitamin with minerals tablet Take 1 tablet by mouth daily.   OSTEO BI-FLEX REGULAR STRENGTH PO Take 1 tablet by mouth at bedtime.   potassium gluconate 595 (99 K) MG Tabs tablet Take 595 mg by mouth at bedtime.   telmisartan 80 MG tablet Commonly known as: MICARDIS TAKE 1 TABLET BY MOUTH EVERY DAY        Allergies:   Allergies  Allergen Reactions   Metoprolol Rash    Family History: Family History  Problem Relation Age of Onset   Sudden death Father 81       ?MI   Heart disease Brother 31       ?afib   Cancer Neg Hx    Diabetes Neg Hx    Stroke Neg Hx    Colon cancer Neg Hx    Esophageal cancer Neg Hx    Stomach cancer Neg Hx     Social History:  reports that he has quit smoking. He has never used smokeless tobacco. He reports current alcohol use. He reports that he does not use drugs.   Physical Exam: BP (!) 164/78   Pulse (!) 51   Ht 5\' 11"  (1.803 m)   Wt 173 lb (78.5 kg)   BMI 24.13 kg/m   Constitutional:  Alert and oriented, No acute distress. HEENT: [place] AT, moist mucus membranes.  Trachea midline, no masses. Cardiovascular: No clubbing, cyanosis, or edema. Respiratory: Normal respiratory effort, no increased work of breathing. GU: Prostate 50 g, smooth without nodules Lymph: No cervical or inguinal lymphadenopathy. Skin: No rashes, bruises or suspicious lesions. Neurologic: Grossly intact, no focal deficits, moving all 4 extremities. Psychiatric: Normal mood and affect.   Assessment & Plan:    1.  Total gross painless hematuria Most likely secondary to BPH Since several years since his last evaluation I did recommend repeat hematuria eval to include CT urogram and cystoscopy.  Both procedures were discussed and he has elected to schedule  2.  Hematospermia Most likely secondary to BPH Benign DRE  3.  BPH with LUTS Stable on [place], MD  [place] [address], [place], [place] [address] [phone]

## 2021-04-22 DIAGNOSIS — I4819 Other persistent atrial fibrillation: Secondary | ICD-10-CM

## 2021-04-22 DIAGNOSIS — I10 Essential (primary) hypertension: Secondary | ICD-10-CM | POA: Diagnosis not present

## 2021-04-24 ENCOUNTER — Encounter: Payer: Self-pay | Admitting: Urology

## 2021-04-25 ENCOUNTER — Other Ambulatory Visit: Payer: Self-pay | Admitting: Cardiology

## 2021-04-25 DIAGNOSIS — I4821 Permanent atrial fibrillation: Secondary | ICD-10-CM

## 2021-04-25 NOTE — Telephone Encounter (Signed)
Prescription refill request for Eliquis received. Indication: afib  Last office visit: Michael Decker 02/09/2021 Scr: 1.14, 07/07/2020 Age: 87 yo  Weight:78.5 kg   Refill sent.

## 2021-05-17 ENCOUNTER — Encounter: Payer: Self-pay | Admitting: Urology

## 2021-05-23 ENCOUNTER — Ambulatory Visit
Admission: RE | Admit: 2021-05-23 | Discharge: 2021-05-23 | Disposition: A | Discharge status: Home / Self Care | Payer: Medicare Other | Source: Ambulatory Visit | Attending: Urology | Admitting: Urology

## 2021-05-23 ENCOUNTER — Other Ambulatory Visit: Payer: Self-pay

## 2021-05-23 DIAGNOSIS — R310 Gross hematuria: Secondary | ICD-10-CM | POA: Diagnosis not present

## 2021-05-23 DIAGNOSIS — D3500 Benign neoplasm of unspecified adrenal gland: Secondary | ICD-10-CM | POA: Diagnosis not present

## 2021-05-23 DIAGNOSIS — R350 Frequency of micturition: Secondary | ICD-10-CM | POA: Diagnosis not present

## 2021-05-23 DIAGNOSIS — K5730 Diverticulosis of large intestine without perforation or abscess without bleeding: Secondary | ICD-10-CM | POA: Diagnosis not present

## 2021-05-23 LAB — POCT I-STAT CREATININE: Creatinine, Ser: 1.2 mg/dL (ref 0.61–1.24)

## 2021-05-23 MED ORDER — IOHEXOL 350 MG/ML SOLN
100.0000 mL | Freq: Once | INTRAVENOUS | Status: AC | PRN
  Administered 2021-05-23: 100 mL via INTRAVENOUS

## 2021-05-26 ENCOUNTER — Other Ambulatory Visit: Payer: Self-pay

## 2021-05-26 ENCOUNTER — Ambulatory Visit: Payer: Medicare Other | Admitting: Urology

## 2021-05-26 ENCOUNTER — Encounter: Payer: Self-pay | Admitting: Urology

## 2021-05-26 VITALS — BP 150/82 | HR 57 | Ht 71.0 in | Wt 172.0 lb

## 2021-05-26 DIAGNOSIS — R310 Gross hematuria: Secondary | ICD-10-CM

## 2021-05-26 MED ORDER — SULFAMETHOXAZOLE-TRIMETHOPRIM 800-160 MG PO TABS: 1.0000 | ORAL_TABLET | Freq: Two times a day (BID) | ORAL | 0 refills | Status: AC

## 2021-05-26 NOTE — Progress Notes (Signed)
   05/26/21  CC:  Chief Complaint  Patient presents with   Cysto    HPI: Refer to my prior note 04/21/2021.  No recurrent gross hematuria and no complaints today.  CTU with a small left adrenal adenoma and no upper tract abnormalities  Blood pressure (!) 150/82, pulse (!) 57, height 5\' 11"  (1.803 m), weight 172 lb (78 kg). NED. A&Ox3.   No respiratory distress   Abd soft, NT, ND Normal phallus with bilateral descended testicles  Cystoscopy Procedure Note  Patient identification was confirmed, informed consent was obtained, and patient was prepped using Betadine solution.  Lidocaine jelly was administered per urethral meatus.     Pre-Procedure: - Inspection reveals a normal caliber urethral meatus.  Procedure: The flexible cystoscope was introduced without difficulty - No urethral strictures/lesions are present. -  Prominent lateral lobe enlargement with hypervascularity  prostate  - Elevated bladder neck, moderate - Bilateral ureteral orifices identified - Bladder mucosa  reveals no ulcers, tumors, or lesions - No bladder stones -Trabeculated bladder with scattered cellules and diverticula  Retroflexion shows no intravesical median lobe   Post-Procedure: - Patient tolerated the procedure well  Assessment/ Plan: No significant upper tract abnormalities on CTU or bladder mucosal abnormalities on cystoscopy Combination of BPH and Eliquis most likely source of his hematuria Urine cytology ordered 56-month follow-up   Abbie Sons, MD

## 2021-05-27 LAB — URINALYSIS, COMPLETE
Bilirubin, UA: NEGATIVE
Glucose, UA: NEGATIVE
Ketones, UA: NEGATIVE
Nitrite, UA: POSITIVE — AB
Protein,UA: NEGATIVE
Specific Gravity, UA: 1.015 (ref 1.005–1.030)
Urobilinogen, Ur: 0.2 mg/dL (ref 0.2–1.0)
pH, UA: 6 (ref 5.0–7.5)

## 2021-05-27 LAB — CYTOLOGY - NON PAP

## 2021-05-27 LAB — MICROSCOPIC EXAMINATION
Epithelial Cells (non renal): NONE SEEN /hpf (ref 0–10)
RBC, Urine: NONE SEEN /hpf (ref 0–2)
WBC, UA: >30 /hpf — ABNORMAL HIGH (ref 0–5)

## 2021-05-31 ENCOUNTER — Encounter: Payer: Self-pay | Admitting: *Deleted

## 2021-05-31 LAB — CULTURE, URINE COMPREHENSIVE

## 2021-06-02 ENCOUNTER — Encounter: Payer: Self-pay | Admitting: Urology

## 2021-06-07 DIAGNOSIS — M1712 Unilateral primary osteoarthritis, left knee: Secondary | ICD-10-CM | POA: Diagnosis not present

## 2021-06-14 ENCOUNTER — Encounter: Payer: Self-pay | Admitting: Family Medicine

## 2021-06-14 DIAGNOSIS — M1712 Unilateral primary osteoarthritis, left knee: Secondary | ICD-10-CM | POA: Diagnosis not present

## 2021-06-21 DIAGNOSIS — M1712 Unilateral primary osteoarthritis, left knee: Secondary | ICD-10-CM | POA: Diagnosis not present

## 2021-06-23 DIAGNOSIS — H47321 Drusen of optic disc, right eye: Secondary | ICD-10-CM | POA: Diagnosis not present

## 2021-07-06 DIAGNOSIS — D225 Melanocytic nevi of trunk: Secondary | ICD-10-CM | POA: Diagnosis not present

## 2021-07-06 DIAGNOSIS — L814 Other melanin hyperpigmentation: Secondary | ICD-10-CM | POA: Diagnosis not present

## 2021-07-06 DIAGNOSIS — L718 Other rosacea: Secondary | ICD-10-CM | POA: Diagnosis not present

## 2021-07-06 DIAGNOSIS — L821 Other seborrheic keratosis: Secondary | ICD-10-CM | POA: Diagnosis not present

## 2021-07-12 ENCOUNTER — Encounter: Payer: Self-pay | Admitting: Cardiology

## 2021-07-18 ENCOUNTER — Other Ambulatory Visit: Payer: Self-pay | Admitting: Family Medicine

## 2021-07-18 DIAGNOSIS — E785 Hyperlipidemia, unspecified: Secondary | ICD-10-CM

## 2021-07-18 DIAGNOSIS — I4819 Other persistent atrial fibrillation: Secondary | ICD-10-CM

## 2021-07-18 DIAGNOSIS — D7589 Other specified diseases of blood and blood-forming organs: Secondary | ICD-10-CM

## 2021-07-19 ENCOUNTER — Other Ambulatory Visit: Payer: Medicare Other

## 2021-07-26 ENCOUNTER — Encounter: Payer: Medicare Other | Admitting: Family Medicine

## 2021-08-03 IMAGING — CR DG RIBS W/ CHEST 3+V*R*
1 series · 3 of 3 positions shown · non-contrast
Comparison: None.

CLINICAL DATA: Right rib pain.  Fall at home.

EXAM:
RIGHT RIBS AND CHEST - 3+ VIEW

[Series 1: dg ribs unilateral w/chest right · 0.14mm/px · 3 of 3 slices shown]
[im 1/3]
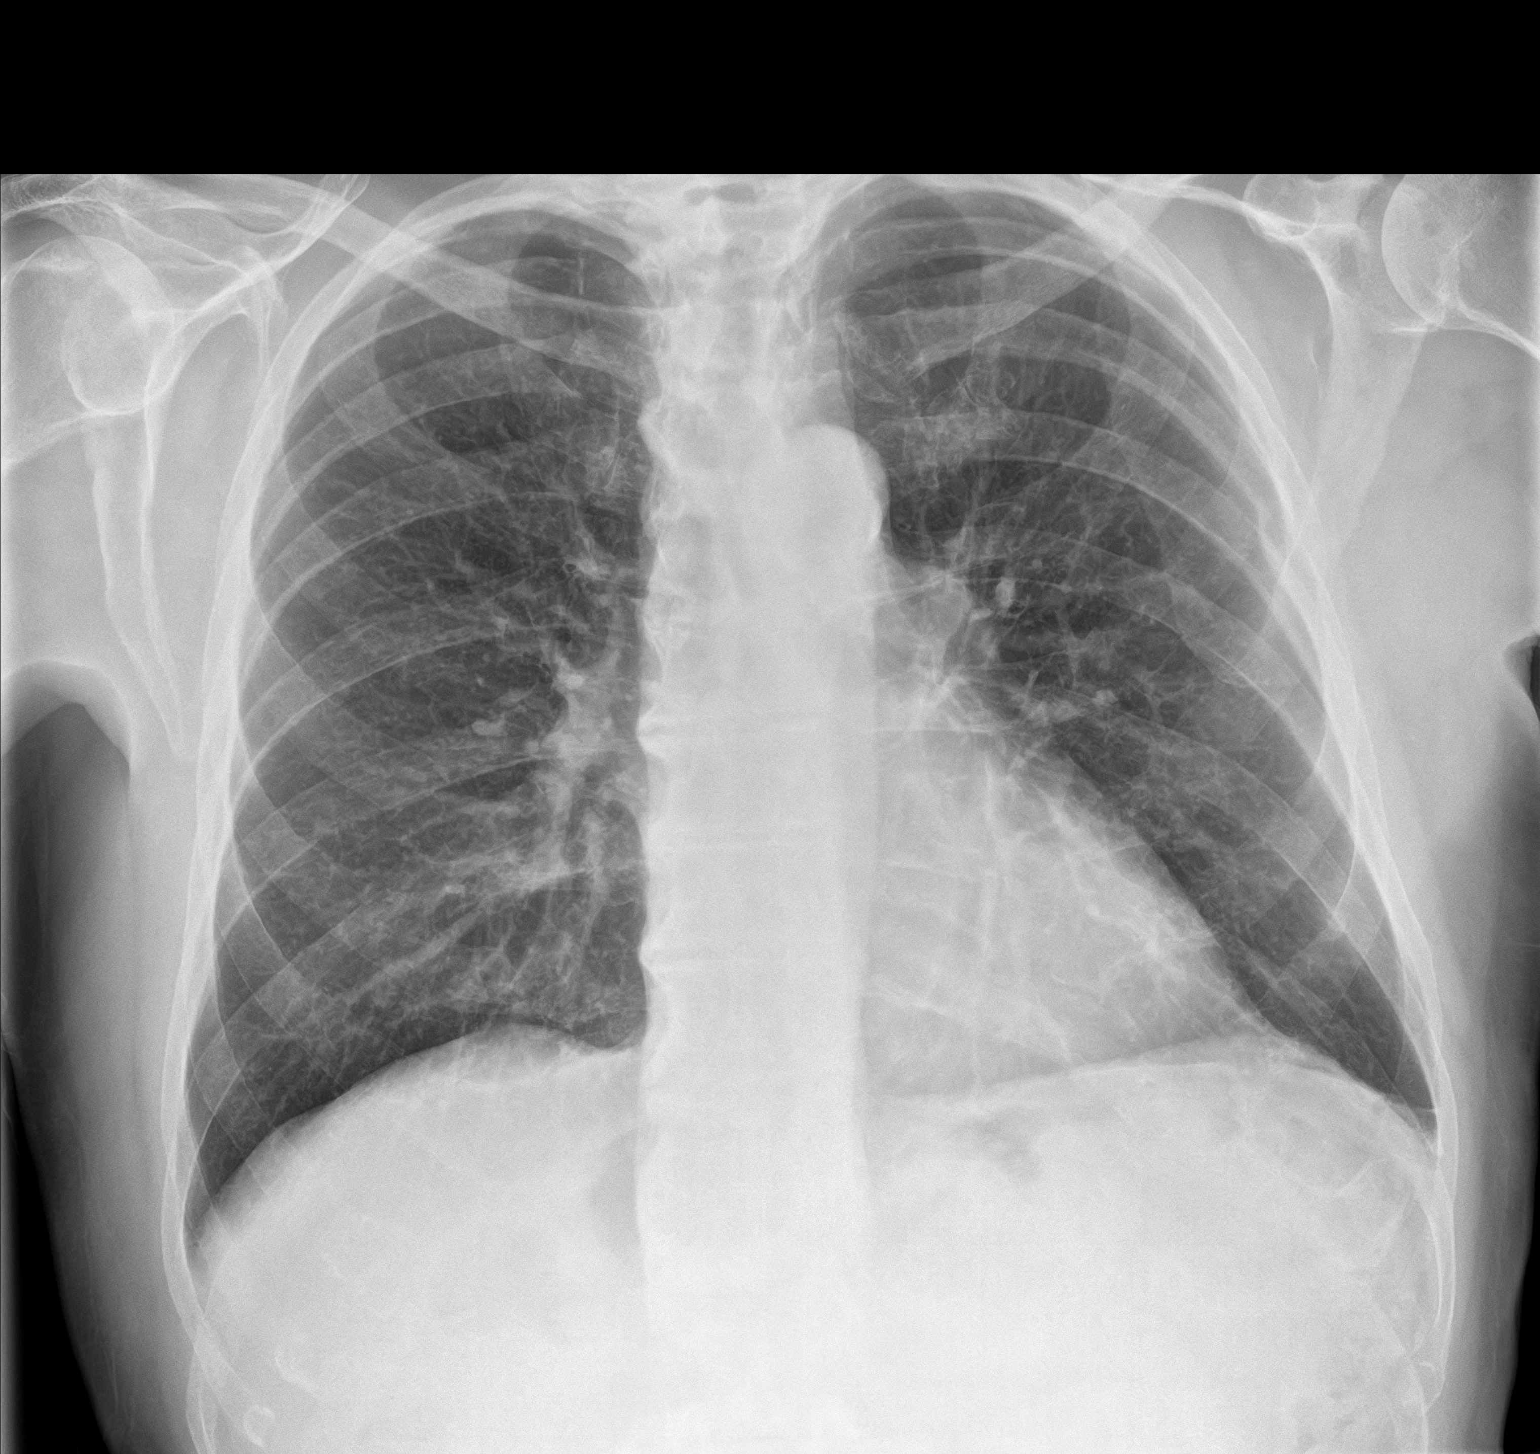
[im 2/3]
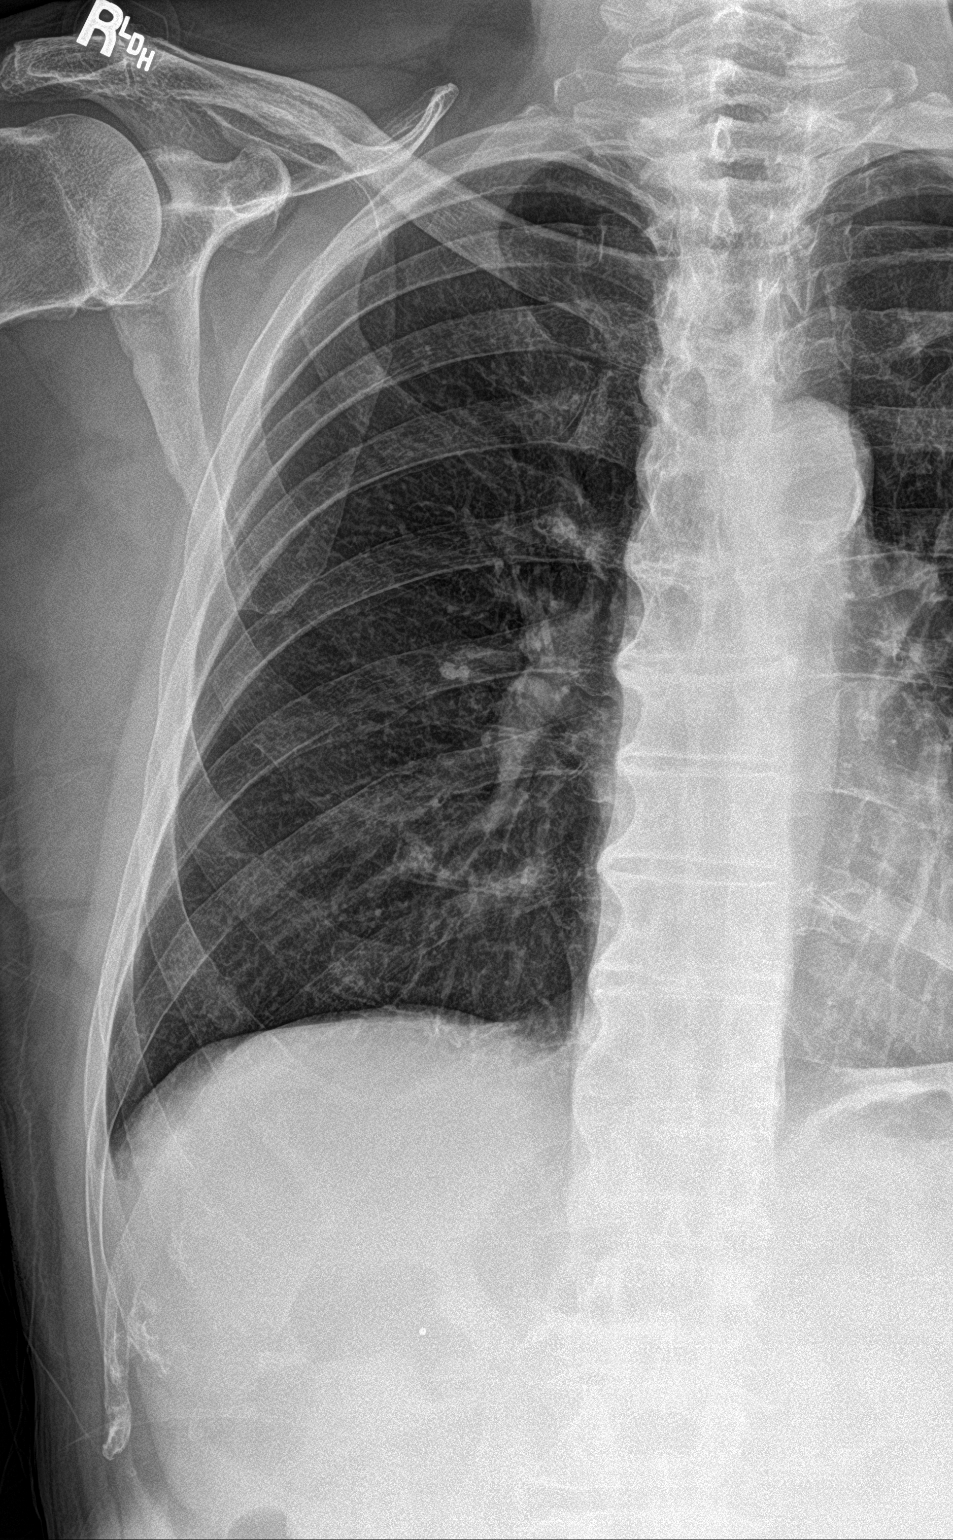
[im 3/3]
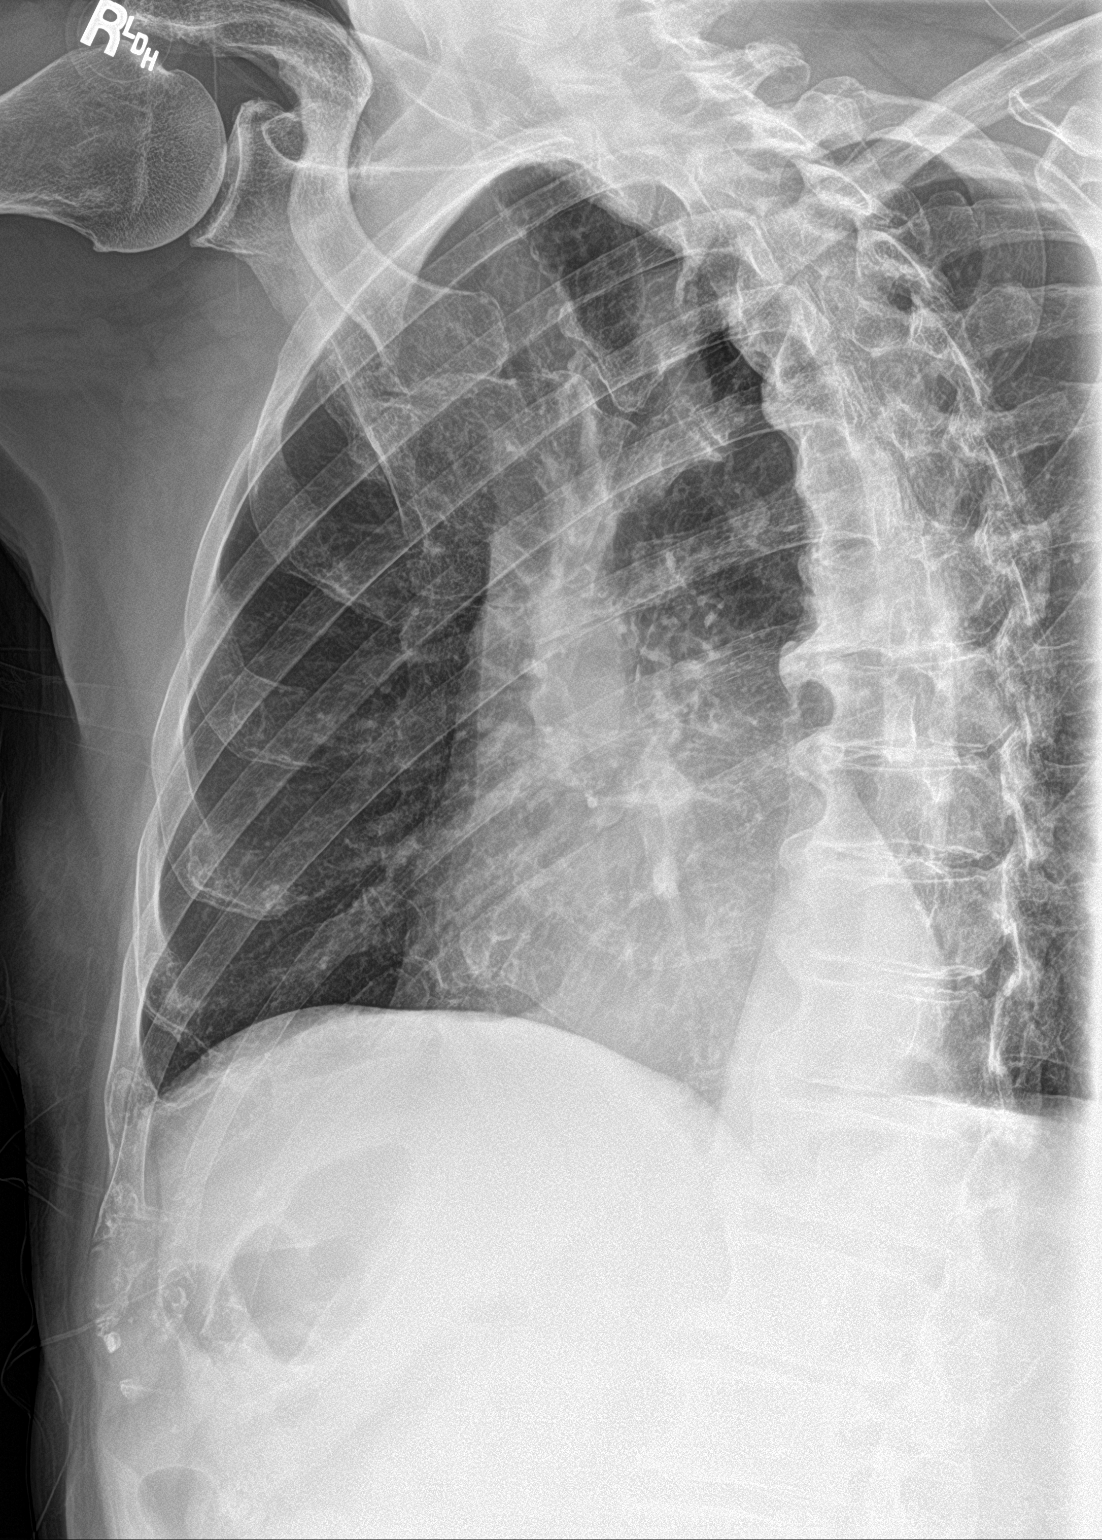

[3 of 3 positions shown; findings below may reference images not displayed]

FINDINGS: No fracture or other bone lesions are seen involving the ribs. There
is no evidence of pneumothorax or pleural effusion. Both lungs are
clear. Heart size and mediastinal contours are within normal limits.
IMPRESSION: Negative.

## 2021-08-04 ENCOUNTER — Other Ambulatory Visit: Payer: Self-pay | Admitting: Family Medicine

## 2021-09-12 ENCOUNTER — Other Ambulatory Visit: Payer: Self-pay | Admitting: Family Medicine

## 2021-09-21 ENCOUNTER — Telehealth: Payer: Self-pay

## 2021-09-21 ENCOUNTER — Telehealth: Payer: Self-pay | Admitting: Family Medicine

## 2021-09-21 DIAGNOSIS — Z66 Do not resuscitate: Secondary | ICD-10-CM

## 2021-09-21 NOTE — Telephone Encounter (Signed)
Forms are in your basket

## 2021-09-21 NOTE — Chronic Care Management (AMB) (Signed)
? ? ?Chronic Care Management ?Pharmacy Assistant  ? ?Name: Michael Decker  MRN: [id number] DOB: Mar 19, 1934 ? ? ?Reason for Encounter:Hypertension  Disease State ? ? ?Recent office visits:  ?None since last CCM contact ? ?Recent consult visits:  ?06/21/21-Orthopedic Surgery-James McGhee,PA-Patient presented for left knee osteoarthritis.Injection performed synvisc #3. ?05/26/21-Urology-Scott Stoioff,MD-Patient presented for Cystoscopy procedure.Labs ordered(Urine culture was positive and sensitive to the prescribed antibiotic) ?04/01/28-Urology-Scott Stoioff,MD-Patient presented for follow up prostate.recommend repeat hematuria eval to include CT urogram and cystoscopy.  ? ?Hospital visits:  ?None in previous 6 months ? ?Medications: ?Outpatient Encounter Medications as of 09/21/2021  ?Medication Sig  ? acetaminophen (TYLENOL) 650 MG CR tablet Take 1,300 mg by mouth at bedtime.  ? calcium carbonate (TUMS - DOSED IN MG ELEMENTAL CALCIUM) 500 MG chewable tablet Chew 1 tablet by mouth as needed for indigestion or heartburn.  ? carvedilol (COREG) 3.125 MG tablet Take 1 tablet (3.125 mg total) by mouth 2 (two) times daily.  ? Cyanocobalamin (B-12) 5000 MCG SUBL Place 5,000 mcg under the tongue as needed.  ? dutasteride (AVODART) 0.5 MG capsule TAKE 1 CAPSULE BY MOUTH EVERY OTHER DAY  ? ELIQUIS 5 MG TABS tablet TAKE 1 TABLET BY MOUTH TWICE DAILY  ? Glucosamine-Chondroitin (OSTEO BI-FLEX REGULAR STRENGTH PO) Take 1 tablet by mouth at bedtime.  ? Krill Oil 500 MG CAPS Take 1 capsule by mouth daily.   ? magnesium gluconate (MAGONATE) 500 MG tablet Take 500 mg by mouth daily.  ? MISC NATURAL PRODUCTS EX Apply topically. Cannabis cream  ? Multiple Vitamins-Minerals (MULTIVITAMIN WITH MINERALS) tablet Take 1 tablet by mouth daily.  ? potassium gluconate 595 (99 K) MG TABS tablet Take 595 mg by mouth at bedtime.  ? telmisartan (MICARDIS) 80 MG tablet TAKE 1 TABLET BY MOUTH EVERY DAY  ? ?No facility-administered encounter medications on  file as of 09/21/2021.  ? ? ?Recent Office Vitals: ?BP Readings from Last 3 Encounters:  ?05/26/21 (!) 150/82  ?04/21/21 (!) 164/78  ?02/09/21 136/70  ? ?Pulse Readings from Last 3 Encounters:  ?05/26/21 (!) 57  ?04/21/21 (!) 51  ?02/09/21 (!) 55  ?  ?Wt Readings from Last 3 Encounters:  ?05/26/21 172 lb (78 kg)  ?04/21/21 173 lb (78.5 kg)  ?02/09/21 181 lb (82.1 kg)  ?  ? ?Kidney Function ?Lab Results  ?Component Value Date/Time  ? CREATININE 1.20 05/23/2021 11:15 AM  ? CREATININE 1.14 07/07/2020 09:57 AM  ? CREATININE 1.24 06/17/2013 12:00 AM  ? GFR 55.68 (L) 05/25/2020 12:45 PM  ? GFRNONAA >60 07/07/2020 09:57 AM  ? ? ?BMP Latest Ref Rng & Units 05/23/2021 07/07/2020 05/25/2020  ?Glucose 70 - 99 mg/dL - 88 83  ?BUN 8 - 23 mg/dL - 22 23  ?Creatinine 0.61 - 1.24 mg/dL 1.20 1.14 1.20  ?Sodium 135 - 145 mmol/L - 137 135  ?Potassium 3.5 - 5.1 mmol/L - 4.5 4.3  ?Chloride 98 - 111 mmol/L - 101 99  ?CO2 22 - 32 mmol/L - 29 30  ?Calcium 8.9 - 10.3 mg/dL - 9.3 8.8  ? ? ? ?Contacted patient on 09/21/21 to discuss hypertension disease state ? ?Current antihypertensive regimen:  ?Telmisartan 80 mg - 1 tablet daily ?Carvedilol 3.125 mg - 1 tablet twice daily ? ? Patient verbally confirms he is taking the above medications as directed. Yes ? ?How often are you checking your Blood Pressure? 1-2x per week sometimes am , sometimes pm  ?Current home BP readings:  ? ?DATE:  BP               PULSE ?08/25/21   141/77  53 ?09/10/21  152/78  59 ?09/11/21  135/77  51 ?09/12/21  141/69  51 ? ? ? ?Wrist or arm cuff:arm cuff ?Caffeine intake: Decaf  only  ?Salt intake:limits  with adding to foods  ?Over the counter medications including pseudoephedrine or NSAIDs? tylenol ? ?Any readings above 180/120? No ? ? ? ?What recent interventions/DTPs have been made by any provider to improve Blood Pressure control since last CPP Visit: -Counseled to monitor BP at home with abnormal symptoms, document, and provide log at future appointments ? ?Any  recent hospitalizations or ED visits since last visit with CPP? No ? ?What diet changes have been made to improve Blood Pressure Control?  ? Limiting adding salt to foods, choosing grilled meats and vegetables.  ? ?What exercise is being done to improve your Blood Pressure Control?  ? Patient reports he works out at [employer]. ?Adherence Review: ?Is the patient currently on ACE/ARB medication? Yes ?Does the patient have >5 day gap between last estimated fill dates? No ? ? ?Star Rating Drugs:  ?Medication:  Last Fill: Day Supply ?Telmisartan 80mg  08/05/21 90 ? ? ?Care Gaps: ?Annual wellness visit in last year? No ?Most Recent BP reading:122/80  06/21/21 ? ? ? ?Upcoming appointments: ?PCP appointment on 10/25/21 ? ? ?Charlene Brooke, [facility]  CPP notified ? ?Stein Windhorst, CCMA ?Clincal Pharmacy Assistant ?[phone] ? ?Total time spent for month ?CPA: 30 min ?

## 2021-09-21 NOTE — Telephone Encounter (Signed)
Left a detailed message on home answering machine that paperwork is up front ready for pickup. ?

## 2021-09-21 NOTE — Telephone Encounter (Signed)
Pt dropped off paper work ? ? ?Type of forms received:DNR ? ?Routed LH:TDSK  ? ?Paperwork received by : terrill ? ? ?Individual made aware of 3-5 business day turn around (Y/N):y ? ?Form completed and patient made aware of charges(Y/N):y ? ? ?Faxed to :  ? ?Form location:  dr g box ?

## 2021-09-21 NOTE — Telephone Encounter (Signed)
Spoke with patient about DNR order request. He confirms he wants to be DNR. Order filled and placed in Lisa's box to scan copy into chart and give 2 originals back to patient.  ?

## 2021-10-10 ENCOUNTER — Telehealth: Payer: Self-pay

## 2021-10-10 NOTE — Progress Notes (Signed)
? ? ?  Chronic Care Management ?Pharmacy Assistant  ? ?Name: Michael Decker  MRN: [id number] DOB: [birth year] ? ?Reason for Encounter: CCM (Scheduled Appointment) ?  ?Patient has been scheduled for a follow-up face to face appointment with Charlene Brooke on 10/18/2021. ?  ?Charlene Brooke, CPP notified ?  ?Marijean Niemann, RMA ?Clinical Pharmacy Assistant ?[phone] ?

## 2021-10-10 NOTE — Progress Notes (Signed)
While on the phone with his wife patient asked me about his Hypertension.  ? ?Patient confirmed speaking with Avel Sensor on 09/21/2021 for his Hypertension call. He stated he needed to go over some things that he did not include.  ? ?Patient was concerned with this blood pressure readings. Patient was asking if CBD gummies would raise his blood pressure. I advised patient that Dr. Danise Mina or Charlene Brooke would be best to seek advice in regards to that. Patient was taking CBD Gummies - 300 mg - 2 in morning; 2 in evening - last dose was 10/03/2021. Patient also stated he was on a bunch of supplements that he is wondering if he really needs. Patient also is using Hemp Cream on his back for back pain. He also takes 1300 mg of Tylenol every night for the back pain.  ? ?Patient gave me some updated blood pressure readings: ? ?Date  Blood Pressure Pulse ?3/20  139/77   59 ?03/19  140/93   54 ?03/18  139/87   62 3:45 pm ?  140/82   50 10:00 pm ?03/11  131/74   58 ? ?Since patient has now stopped taking the gummies I asked him to take his blood pressure daily and keep a log. I advised patient I would call him back on 10/14/2021 for his numbers. Patient understood and agreed.  ? ?Charlene Brooke, CPP notified ? ?Marijean Niemann, RMA ?Clinical Pharmacy Assistant ?[phone] ? ? ?

## 2021-10-13 ENCOUNTER — Telehealth: Payer: Self-pay

## 2021-10-13 NOTE — Chronic Care Management (AMB) (Signed)
? ? ?  Chronic Care Management ?Pharmacy Assistant  ? ?Name: Michael Decker  MRN: [id number] DOB: 07-08-1934 ? ?Reason for Encounter: Reminder Call ?  ?Conditions to be addressed/monitored: ?HTN and HLD ? ?Medications: ?Outpatient Encounter Medications as of 10/13/2021  ?Medication Sig  ? acetaminophen (TYLENOL) 650 MG CR tablet Take 1,300 mg by mouth at bedtime.  ? calcium carbonate (TUMS - DOSED IN MG ELEMENTAL CALCIUM) 500 MG chewable tablet Chew 1 tablet by mouth as needed for indigestion or heartburn.  ? carvedilol (COREG) 3.125 MG tablet Take 1 tablet (3.125 mg total) by mouth 2 (two) times daily.  ? Cyanocobalamin (B-12) 5000 MCG SUBL Place 5,000 mcg under the tongue as needed.  ? dutasteride (AVODART) 0.5 MG capsule TAKE 1 CAPSULE BY MOUTH EVERY OTHER DAY  ? ELIQUIS 5 MG TABS tablet TAKE 1 TABLET BY MOUTH TWICE DAILY  ? Glucosamine-Chondroitin (OSTEO BI-FLEX REGULAR STRENGTH PO) Take 1 tablet by mouth at bedtime.  ? Krill Oil 500 MG CAPS Take 1 capsule by mouth daily.   ? magnesium gluconate (MAGONATE) 500 MG tablet Take 500 mg by mouth daily.  ? MISC NATURAL PRODUCTS EX Apply topically. Cannabis cream  ? Multiple Vitamins-Minerals (MULTIVITAMIN WITH MINERALS) tablet Take 1 tablet by mouth daily.  ? potassium gluconate 595 (99 K) MG TABS tablet Take 595 mg by mouth at bedtime.  ? telmisartan (MICARDIS) 80 MG tablet TAKE 1 TABLET BY MOUTH EVERY DAY  ? ?No facility-administered encounter medications on file as of 10/13/2021.  ? ?[name] was contacted to remind of upcoming office visit with Charlene Brooke on 10/18/21 at 9:00am. Patient was reminded to have any blood glucose and blood pressure readings available for review at appointment.  ? ?Patient confirmed appointment. ? ? ?Are you having any problems with your medications? No  ? ?Do you have any concerns you like to discuss with the pharmacist?  He has concern regarding BP and using CBD gummies ? ? ? ?Star Rating Drugs: ?Medication:  Last Fill: Day  Supply ?Telmisartan '80mg'$   08/05/21 90 ? ?Charlene Brooke, CPP notified ? ?Bron Snellings, CCMA ?Health concierge  ?[phone]  ?  ?

## 2021-10-18 ENCOUNTER — Ambulatory Visit (INDEPENDENT_AMBULATORY_CARE_PROVIDER_SITE_OTHER): Payer: Medicare Other | Admitting: Pharmacist

## 2021-10-18 ENCOUNTER — Other Ambulatory Visit: Payer: Self-pay | Admitting: *Deleted

## 2021-10-18 ENCOUNTER — Other Ambulatory Visit (INDEPENDENT_AMBULATORY_CARE_PROVIDER_SITE_OTHER): Payer: Medicare Other

## 2021-10-18 ENCOUNTER — Other Ambulatory Visit: Payer: Self-pay

## 2021-10-18 DIAGNOSIS — I10 Essential (primary) hypertension: Secondary | ICD-10-CM

## 2021-10-18 DIAGNOSIS — N401 Enlarged prostate with lower urinary tract symptoms: Secondary | ICD-10-CM | POA: Diagnosis not present

## 2021-10-18 DIAGNOSIS — I4819 Other persistent atrial fibrillation: Secondary | ICD-10-CM

## 2021-10-18 DIAGNOSIS — E785 Hyperlipidemia, unspecified: Secondary | ICD-10-CM

## 2021-10-18 DIAGNOSIS — I4821 Permanent atrial fibrillation: Secondary | ICD-10-CM

## 2021-10-18 DIAGNOSIS — D7589 Other specified diseases of blood and blood-forming organs: Secondary | ICD-10-CM

## 2021-10-18 LAB — COMPREHENSIVE METABOLIC PANEL
ALT: 28 U/L (ref 0–53)
AST: 28 U/L (ref 0–37)
Albumin: 4 g/dL (ref 3.5–5.2)
Alkaline Phosphatase: 60 U/L (ref 39–117)
BUN: 25 mg/dL — ABNORMAL HIGH (ref 6–23)
CO2: 31 mEq/L (ref 19–32)
Calcium: 9.3 mg/dL (ref 8.4–10.5)
Chloride: 104 mEq/L (ref 96–112)
Creatinine, Ser: 1.19 mg/dL (ref 0.40–1.50)
GFR: 55.69 mL/min — ABNORMAL LOW (ref >60.00)
Glucose, Bld: 91 mg/dL (ref 70–99)
Potassium: 4.5 mEq/L (ref 3.5–5.1)
Sodium: 139 mEq/L (ref 135–145)
Total Bilirubin: 0.8 mg/dL (ref 0.2–1.2)
Total Protein: 6.2 g/dL (ref 6.0–8.3)

## 2021-10-18 LAB — IBC PANEL
Iron: 150 ug/dL (ref 42–165)
Saturation Ratios: 46.8 % (ref 20.0–50.0)
TIBC: 320.6 ug/dL (ref 250.0–450.0)
Transferrin: 229 mg/dL (ref 212.0–360.0)

## 2021-10-18 LAB — LIPID PANEL
Cholesterol: 143 mg/dL (ref 0–200)
HDL: 56.9 mg/dL (ref >39.00)
LDL Cholesterol: 65 mg/dL (ref 0–99)
NonHDL: 86.52
Total CHOL/HDL Ratio: 3
Triglycerides: 106 mg/dL (ref 0.0–149.0)
VLDL: 21.2 mg/dL (ref 0.0–40.0)

## 2021-10-18 LAB — PSA, MEDICARE: PSA: 0.31 ng/ml (ref 0.10–4.00)

## 2021-10-18 MED ORDER — APIXABAN 5 MG PO TABS: 5.0000 mg | ORAL_TABLET | Freq: Two times a day (BID) | ORAL | 1 refills | Status: DC

## 2021-10-18 NOTE — Patient Instructions (Signed)
Visit Information ? ?Phone number for Pharmacist: [phone] ? ? Goals Addressed   ? ?  ?  ?  ?  ? This Visit's Progress  ?  Track and Manage My Blood Pressure-Hypertension     ?  Timeframe:  Long-Range Goal ?Priority:  Medium ?Start Date:     10/18/21                        ?Expected End Date:       10/19/22               ? ?Follow Up Date Sept 2023 ?  ?- check blood pressure daily ?- choose a place to take my blood pressure (home, clinic or office, retail store) ?- write blood pressure results in a log or diary  ?  ?Why is this important?   ?You won't feel high blood pressure, but it can still hurt your blood vessels.  ?High blood pressure can cause heart or kidney problems. It can also cause a stroke.  ?Making lifestyle changes like losing a little weight or eating less salt will help.  ?Checking your blood pressure at home and at different times of the day can help to control blood pressure.  ?If the doctor prescribes medicine remember to take it the way the doctor ordered.  ?Call the office if you cannot afford the medicine or if there are questions about it.   ?  ?Notes:  ?  ? ?  ? ? ?Care Plan : [place]  ?Updates made by Charlton Haws, [place] since 10/18/2021 12:00 AM  ?  ? ?Problem: Hypertension and Atrial Fibrillation   ?Priority: High  ?  ? ?Long-Range Goal: Disease mgmt   ?Start Date: 10/18/2021  ?Expected End Date: 10/19/2022  ?This Visit's Progress: On track  ?Priority: High  ?Note:   ?Current Barriers:  ?None identified ? ?Pharmacist Clinical Goal(s):  ?Patient will contact provider office for questions/concerns as evidenced notation of same in electronic health record through collaboration with PharmD and provider.  ? ?Interventions: ?1:1 collaboration with Ria Bush, MD regarding development and update of comprehensive plan of care as evidenced by provider attestation and co-signature ?Inter-disciplinary care team collaboration (see longitudinal plan of care) ?Comprehensive  medication review performed; medication list updated in electronic medical record ? ?Hypertension (BP goal <140/90) ?-Controlled - BP mostly at goal at home ?-Last ejection fraction: 45-50% (Date: 06/2020) ?-HF type: Systolic; NYHA Class: I (no actitivty limitation) ?-Current home BP readings:  ? Date AM  HS ?3/20 139/77  162/84 ? 3/21 132/88  140/81 ? 3/22 140/83  146/85 ? 3/23 140/86  128/68 ? 3/24 129/75  109/59 ? 3/25 138/73  124/71 ? 3/26 133/75  147/69 ? 3/27 147/78  128/80 ?-Current treatment: ?Carvedilol 3.125 mg BID - Appropriate, Effective, Safe, Accessible ?Telmisartan 80 mg daily - Appropriate, Effective, Safe, Accessible ?-Medications previously tried: diltiazem, metoprolol, losartan ?-Educated on BP goals and benefits of medications for prevention of heart attack, stroke and kidney damage; ?-Counseled to monitor BP at home daily, document, and provide log at future appointments ?-Recommended to continue current medication ? ?Atrial Fibrillation (Goal: prevent stroke and major bleeding) ?-Controlled ?-CHADSVASC: 4 ?-Current treatment: ?Carvedilol 3.125 mg BID - Appropriate, Effective, Safe, Accessible ?Eliquis 5 mg BID - Appropriate, Effective, Safe, Accessible ?-Medications previously tried: n/a ?-Counseled on increased risk of stroke due to Afib and benefits of anticoagulation for stroke prevention; ?-Recommended to continue current medication ? ?Patient Goals/Self-Care  Activities ?Patient will:  ?- take medications as prescribed as evidenced by patient report and record review ?focus on medication adherence by routine ?check blood pressure daily, document, and provide at future appointments ? ?  ?  ? ?Patient verbalizes understanding of instructions and care plan provided today and agrees to view in [place]. Active MyChart status confirmed with patient.   ?Telephone follow up appointment with pharmacy team member scheduled for: 6 months ? ?Charlene Brooke, PharmD, BCACP ?Clinical  Pharmacist ?[facility] ?[phone] ?  ?

## 2021-10-18 NOTE — Telephone Encounter (Signed)
Eliquis '5mg'$  paper refill request received. Patient is 88 years old, weight-78kg, Crea-1.20 on 05/23/2021, Diagnosis-Afib, and last seen by Dr. Quentin Ore on 02/09/2021. Dose is appropriate based on dosing criteria. Will send in refill to requested pharmacy.   ?

## 2021-10-18 NOTE — Addendum Note (Signed)
Addended by: Ellamae Sia on: 10/18/2021 08:13 AM ? ? Modules accepted: Orders ? ?

## 2021-10-18 NOTE — Progress Notes (Signed)
? ?Chronic Care Management ?Pharmacy Note ? ?10/18/2021 ?Name:  Michael Decker MRN:  [id number] DOB:  Mar 11, 1934 ? ?Summary: CCM F/U visit ?-Pt was concerned about BP, per home readings BP is mostly at goal < 140/90 ? ?Recommendations/Changes made from today's visit: ?-No med changes ? ?Plan: ?-PCP CPE 10/25/21 ?-[facility] Concierge will call patient 3 months for BP update ?-Pharmacist follow up televisit scheduled for 6 months ? ? ? ?Subjective: ?Michael Decker is an 88 y.o. year old male who is a primary patient of Ria Bush, MD.  The CCM team was consulted for assistance with disease management and care coordination needs.   ? ?Engaged with patient face to face for follow up visit in response to provider referral for pharmacy case management and/or care coordination services.  ? ?Consent to Services:  ?The patient was given information about Chronic Care Management services, agreed to services, and gave verbal consent prior to initiation of services.  Please see initial visit note for detailed documentation.  ? ?Patient Care Team: ?Ria Bush, MD as PCP - General (Family Medicine) ?Vickie Epley, MD as PCP - Electrophysiology (Cardiology) ?Druscilla Brownie, MD as Consulting Physician (Dermatology) ?Raynelle Bring, MD as Consulting Physician (Urology) ?Albertine Patricia, DPM (Inactive) as Attending Physician (Podiatry) ?Leandrew Koyanagi, MD as Referring Physician (Ophthalmology) ?Irene Shipper, MD as Consulting Physician (Gastroenterology) ?Brayant Dorr, Cleaster Corin, [facility] as Pharmacist (Pharmacist) ? ?Recent office visits: ?02/08/21 Dr Danise Mina OV: f/u HTN. BP 124/66. No changes. ? ?Recent consult visits: ?06/21/21 PA Damaris Hippo (Ortho): f/u L Knee OA. ?04/21/21 Dr Bernardo Heater (Urology): f/u gross hematuria. Likely 2/2 BPH. Ordred CT urogram and cystoscopy. ? ?Hospital visits: ?None in previous 6 months ? ? ?Objective: ? ?Lab Results  ?Component Value Date  ? CREATININE 1.20 05/23/2021  ? BUN 22  07/07/2020  ? GFR 55.68 (L) 05/25/2020  ? GFRNONAA >60 07/07/2020  ? NA 137 07/07/2020  ? K 4.5 07/07/2020  ? CALCIUM 9.3 07/07/2020  ? CO2 29 07/07/2020  ? GLUCOSE 88 07/07/2020  ? ? ?Lab Results  ?Component Value Date/Time  ? GFR 55.68 (L) 05/25/2020 12:45 PM  ? GFR 58.92 (L) 07/07/2019 09:09 AM  ?  ?Last diabetic Eye exam: No results found for: HMDIABEYEEXA  ?Last diabetic Foot exam: No results found for: HMDIABFOOTEX  ? ?Lab Results  ?Component Value Date  ? CHOL 159 05/25/2020  ? HDL 65.90 05/25/2020  ? [place] 76 05/25/2020  ? TRIG 90.0 05/25/2020  ? CHOLHDL 2 05/25/2020  ? ? ? ?  Latest Ref Rng & Units 05/25/2020  ? 12:45 PM 07/07/2019  ?  9:09 AM 07/02/2018  ?  9:16 AM  ?Hepatic Function  ?Total Protein 6.0 - 8.3 g/dL 6.4   6.0   6.0    ?Albumin 3.5 - 5.2 g/dL 3.8   3.7   3.8    ?AST 0 - 37 U/L 24   21   22     ?ALT 0 - 53 U/L 19   16   15     ?Alk Phosphatase 39 - 117 U/L 62   63   61    ?Total Bilirubin 0.2 - 1.2 mg/dL 0.9   0.8   0.7    ? ? ?Lab Results  ?Component Value Date/Time  ? TSH 1.05 05/25/2020 12:45 PM  ? TSH 1.28 04/20/2017 10:45 AM  ? ? ? ?  Latest Ref Rng & Units 07/07/2020  ?  9:57 AM 05/25/2020  ? 12:45 PM 04/20/2017  ? 10:45  AM  ?CBC  ?WBC 4.0 - 10.5 K/uL 7.6   6.6   7.5    ?Hemoglobin 13.0 - 17.0 g/dL 15.2   15.2   14.4    ?Hematocrit 39.0 - 52.0 % 45.1   44.5   43.1    ?Platelets 150 - 400 K/uL 209   200.0   209.0    ? ? ?No results found for: VD25OH ? ?Clinical ASCVD: No  ?The ASCVD Risk score (Arnett DK, et al., 2019) failed to calculate for the following reasons: ?  The 2019 ASCVD risk score is only valid for ages 49 to 92   ? ? ?  07/14/2020  ?  3:34 PM 07/14/2019  ?  2:18 PM 07/10/2018  ? 11:35 AM  ?Depression screen PHQ 2/9  ?Decreased Interest 0 0 0  ?Down, Depressed, Hopeless 0 0 0  ?PHQ - 2 Score 0 0 0  ?  ? ?CHA2DS2/VAS Stroke Risk Points  Current as of a minute ago  ?   4 >= 2 Points: High Risk  ?1 - 1.99 Points: Medium Risk  ?0 Points: Low Risk  ? ?  ? Points Metrics  ?1 Has  Congestive Heart Failure:  Yes   ? Current as of a minute ago  ?0 Has Vascular Disease:  No   ? Current as of a minute ago  ?1 Has Hypertension:  Yes   ? Current as of a minute ago  ?2 Age:  89   ? Current as of a minute ago  ?0 Has Diabetes:  No   ? Current as of a minute ago  ?0 Had Stroke:  No  Had TIA:  No  Had Thromboembolism:  No   ? Current as of a minute ago  ?0 Male:  No   ? Current as of a minute ago  ?  ? ? ?Social History  ? ?Tobacco Use  ?Smoking Status Former  ?Smokeless Tobacco Never  ?Tobacco Comments  ? cigars back in the 60's  ? ?BP Readings from Last 3 Encounters:  ?05/26/21 (!) 150/82  ?04/21/21 (!) 164/78  ?02/09/21 136/70  ? ?Pulse Readings from Last 3 Encounters:  ?05/26/21 (!) 57  ?04/21/21 (!) 51  ?02/09/21 (!) 55  ? ?Wt Readings from Last 3 Encounters:  ?05/26/21 172 lb (78 kg)  ?04/21/21 173 lb (78.5 kg)  ?02/09/21 181 lb (82.1 kg)  ? ?BMI Readings from Last 3 Encounters:  ?05/26/21 23.99 kg/m?  ?04/21/21 24.13 kg/m?  ?02/09/21 25.24 kg/m?  ? ? ?Assessment/Interventions: Review of patient past medical history, allergies, medications, health status, including review of consultants reports, laboratory and other test data, was performed as part of comprehensive evaluation and provision of chronic care management services.  ? ?SDOH:  (Social Determinants of Health) assessments and interventions performed: Yes ?SDOH Interventions   ? ?Flowsheet Row Most Recent Value  ?SDOH Interventions   ?Food Insecurity Interventions Intervention Not Indicated  ?Transportation Interventions Intervention Not Indicated  ? ?  ? ?SDOH Screenings  ? ?Alcohol Screen: Not on file  ?Depression (PHQ2-9): Not on file  ?Financial Resource Strain: Low Risk   ? Difficulty of Paying Living Expenses: Not very hard  ?Food Insecurity: No Food Insecurity  ? Worried About Charity fundraiser in the Last Year: Never true  ? Ran Out of Food in the Last Year: Never true  ?Housing: Not on file  ?Physical Activity: Not on file   ?Social Connections: Not on file  ?Stress: Not on  file  ?Tobacco Use: Medium Risk  ? Smoking Tobacco Use: Former  ? Smokeless Tobacco Use: Never  ? Passive Exposure: Not on file  ?Transportation Needs: No Transportation Needs  ? Lack of Transportation (Medical): No  ? Lack of Transportation (Non-Medical): No  ? ? ?CCM Care Plan ? ?Allergies  ?Allergen Reactions  ? Metoprolol Rash  ? ? ?Medications Reviewed Today   ? ? Reviewed by Charlton Haws, RPH (Pharmacist) on 10/18/21 at 1011  Med List Status: <None>  ? ?Medication Order Taking? Sig Documenting Provider Last Dose Status Informant  ?acetaminophen (TYLENOL) 650 MG CR tablet [id number] Yes Take 1,300 mg by mouth at bedtime. [provider] Taking Active   ?apixaban (ELIQUIS) 5 MG TABS tablet [id number] Yes Take 1 tablet (5 mg total) by mouth 2 (two) times daily. Vickie Epley, MD Taking Active   ?calcium carbonate (TUMS - DOSED IN MG ELEMENTAL CALCIUM) 500 MG chewable tablet [id number] Yes Chew 1 tablet by mouth as needed for indigestion or heartburn. [provider] Taking Active Self  ?carvedilol (COREG) 3.125 MG tablet [id number]  Take 1 tablet (3.125 mg total) by mouth 2 (two) times daily. Vickie Epley, MD  Expired 04/04/21 2359   ?Cyanocobalamin (B-12) 5000 MCG SUBL [id number] Yes Place 5,000 mcg under the tongue as needed. [provider] Taking Active Self  ?dutasteride (AVODART) 0.5 MG capsule [id number] Yes TAKE 1 CAPSULE BY MOUTH EVERY OTHER DAY Ria Bush, MD Taking Active   ?Glucosamine-Chondroitin (OSTEO BI-FLEX REGULAR STRENGTH PO) [id number] Yes Take 1 tablet by mouth at bedtime. [provider] Taking Active Self  ?Krill Oil 500 MG CAPS [id number] Yes Take 1 capsule by mouth daily.  [provider] Taking Active Self  ?magnesium gluconate (MAGONATE) 500 MG tablet [id number] Yes Take 500 mg by mouth daily. [provider] Taking Active Self  ?[place] NATURAL PRODUCTS EX [id number]  Yes Apply topically. Cannabis cream [provider] Taking Active Self  ?Multiple Vitamins-Minerals (MULTIVITAMIN WITH MINERALS) tablet [id number] Yes Take 1 tablet by mouth daily. [provider] Rich Number

## 2021-10-19 LAB — CBC WITH DIFFERENTIAL/PLATELET
Absolute Monocytes: 625 cells/uL (ref 200–950)
Basophils Absolute: 42 cells/uL (ref 0–200)
Basophils Relative: 0.8 %
Eosinophils Absolute: 191 cells/uL (ref 15–500)
Eosinophils Relative: 3.6 %
HCT: 45.2 % (ref 38.5–50.0)
Hemoglobin: 15.3 g/dL (ref 13.2–17.1)
Lymphs Abs: 1521 cells/uL (ref 850–3900)
MCH: 34.2 pg — ABNORMAL HIGH (ref 27.0–33.0)
MCHC: 33.8 g/dL (ref 32.0–36.0)
MCV: 100.9 fL — ABNORMAL HIGH (ref 80.0–100.0)
MPV: 11.1 fL (ref 7.5–12.5)
Monocytes Relative: 11.8 %
Neutro Abs: 2920 cells/uL (ref 1500–7800)
Neutrophils Relative %: 55.1 %
Platelets: 183 10*3/uL (ref 140–400)
RBC: 4.48 10*6/uL (ref 4.20–5.80)
RDW: 11.6 % (ref 11.0–15.0)
Total Lymphocyte: 28.7 %
WBC: 5.3 10*3/uL (ref 3.8–10.8)

## 2021-10-19 LAB — PATHOLOGIST SMEAR REVIEW

## 2021-10-21 DIAGNOSIS — I4891 Unspecified atrial fibrillation: Secondary | ICD-10-CM | POA: Diagnosis not present

## 2021-10-21 DIAGNOSIS — I10 Essential (primary) hypertension: Secondary | ICD-10-CM

## 2021-10-25 ENCOUNTER — Ambulatory Visit (INDEPENDENT_AMBULATORY_CARE_PROVIDER_SITE_OTHER)
Admission: RE | Admit: 2021-10-25 | Discharge: 2021-10-25 | Disposition: A | Discharge status: Home / Self Care | Payer: HMO | Source: Ambulatory Visit | Attending: Family Medicine | Admitting: Family Medicine

## 2021-10-25 ENCOUNTER — Encounter: Payer: Self-pay | Admitting: Family Medicine

## 2021-10-25 ENCOUNTER — Ambulatory Visit (INDEPENDENT_AMBULATORY_CARE_PROVIDER_SITE_OTHER): Payer: HMO | Admitting: Family Medicine

## 2021-10-25 VITALS — BP 134/76 | HR 71 | Temp 97.7°F | Ht 69.75 in | Wt 173.5 lb

## 2021-10-25 DIAGNOSIS — Z0000 Encounter for general adult medical examination without abnormal findings: Secondary | ICD-10-CM | POA: Diagnosis not present

## 2021-10-25 DIAGNOSIS — M545 Low back pain, unspecified: Secondary | ICD-10-CM

## 2021-10-25 DIAGNOSIS — Z66 Do not resuscitate: Secondary | ICD-10-CM

## 2021-10-25 DIAGNOSIS — G8929 Other chronic pain: Secondary | ICD-10-CM

## 2021-10-25 DIAGNOSIS — M47816 Spondylosis without myelopathy or radiculopathy, lumbar region: Secondary | ICD-10-CM | POA: Diagnosis not present

## 2021-10-25 DIAGNOSIS — N401 Enlarged prostate with lower urinary tract symptoms: Secondary | ICD-10-CM

## 2021-10-25 DIAGNOSIS — I4819 Other persistent atrial fibrillation: Secondary | ICD-10-CM

## 2021-10-25 DIAGNOSIS — E785 Hyperlipidemia, unspecified: Secondary | ICD-10-CM

## 2021-10-25 DIAGNOSIS — M1712 Unilateral primary osteoarthritis, left knee: Secondary | ICD-10-CM

## 2021-10-25 DIAGNOSIS — I10 Essential (primary) hypertension: Secondary | ICD-10-CM

## 2021-10-25 NOTE — Assessment & Plan Note (Addendum)
Chronic, stable on current regimen of carvedilol and telmisartan - continue this.  ?

## 2021-10-25 NOTE — Assessment & Plan Note (Addendum)
Remains irregular. Continue eliquis, carvedilol. Sees cardiology.  ?

## 2021-10-25 NOTE — Assessment & Plan Note (Signed)

## 2021-10-25 NOTE — Assessment & Plan Note (Signed)
Preventative protocols reviewed and updated unless pt declined. Discussed healthy diet and lifestyle.  

## 2021-10-25 NOTE — Assessment & Plan Note (Signed)
Seeing ortho.   

## 2021-10-25 NOTE — Assessment & Plan Note (Addendum)
Continues avodart QOD for BPH.  ?Last saw urology 2016.  ?

## 2021-10-25 NOTE — Assessment & Plan Note (Addendum)
Confirmed 09/2021.  ?They now live at [facility]. DNR [place] form filled and provided to patient.  ? ?

## 2021-10-25 NOTE — Assessment & Plan Note (Signed)
Chronic, worsening over the past year.  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  ?He is managing pain with hemp cream, tylenol.  ?Update lumbar films today.  ?

## 2021-10-25 NOTE — Assessment & Plan Note (Addendum)
Chronic, stable off prescription medication. He continues taking krill oil. ?The ASCVD Risk score (Arnett DK, et al., 2019) failed to calculate for the following reasons: ?  The 2019 ASCVD risk score is only valid for ages 69 to 69  ?

## 2021-10-25 NOTE — Progress Notes (Signed)
? ? Patient ID: Michael Decker, male    DOB: [birth year], 88 y.o.   MRN: [id number] ? ?This visit was conducted in person. ? ?BP 134/76   Pulse 71   Temp 97.7 ?F (36.5 ?C) (Temporal)   Ht 5' 9.75" (1.772 m)   Wt 173 lb 8 oz (78.7 kg)   SpO2 97%   BMI 25.07 kg/m?   ? ?CC: AMW ?Subjective:  ? ?HPI: ?Michael Decker is a 88 y.o. male presenting on 10/25/2021 for Medicare Wellness (Pt accompanied by wife, [name]. ) ? ? ?Did not see health advisor this year.  ?Finally moved to [facility] 08/24/2021 - very happy with move. Using gym at [facility].  ? ?Hearing Screening - Comments:: Wears bilateral hearing aids.  Not wearing at today's OV since old ones stopped working.  Getting new ones next week.  Pt declines hearing screening today.  ?Vision Screening - Comments:: Last eye exam, 06/2021.  ?[place] Office Visit from 10/25/2021 in [place] [facility] [place]  ?PHQ-2 Total Score 0  ? ?  ?  ? ?  10/25/2021  ?  3:01 PM 07/14/2020  ?  3:33 PM 07/14/2019  ?  2:17 PM 02/20/2019  ?  3:04 PM 07/10/2018  ? 11:35 AM  ?Fall Risk   ?Falls in the past year? 0 1 0 0 0  ?Comment    Emmi Telephone Survey: data to providers prior to load   ?Number falls in past yr:  1     ?Injury with Fall?  1     ? ?Seeing orthopedics for left knee osteoarthritis, s/p recent synvisc injections.  ? ?Atrial fibrillation diagnosis last year now on eliquis and carvedilol. Saw cardiology s/p unsuccessful cardioversion 2021- had really itchy rash to cardioversion pads (chest and back).  ?Notes decreased stamina since afib diagnosis.  ? ?Notes worsening arthritis and L>R lower back pain worse at night in h/o bulging discs. Using hemp cream with benefit. Also using tylenol. Back pain is waking him up at night time, sometimes uses cold back. Symptoms ongoing >1 yr. Saw Dr Annette Stable neurosurgery in [place] - rec conservative management, this did improve. Currently without shooting pain down legs or numbness/weakness of legs. Continues bicycling regularly  ? ?MRI  lumbar spine without contrast 12/2019:  ?Multilevel spondylosis. Moderate spinal canal and moderate R and severe L ?neural foraminal narrowing at the L3-4 level: L paracentral and extraforaminal protrusions with effacement of L lateral recess as well as anteriorly projecting 0.8cm synovial cyst arising from L facet joint.  ? ?Preventative: ?COLONOSCOPY 02/2017 TA, diverticulosis, int hem no f/u needed Henrene Pastor) ?Prostate - previously seen by Dr Alinda Money - h/o BPH. On avodart. Nocturia 4x, weak stream.  ?Lung cancer screen - not eligible ?Flu shot yearly ?COVID vaccine Moderna 08/2019, 09/2019, 04/2020 booster, rpt 1 yr ?Pneumovax 2002 and 2017, [id number]-13 2015 ?Tdap 2010  ?zostavax 2013 ?shingrix - discussed, declines ?Advanced planning - HCPOA and advanced directive scanned in chart 06/2015. Wife Pat then Merideth Abbey then Ellard Artis are [facility].  ?Seat belt use discussed  ?Sunscreen use discussed. No changing moles on skin.  ?Ex smoker (cigars)  ?Alcohol - one beer/week - has cut down  ?Dentist yearly  ?Eye exam yearly  ?Bowel - no constipation  ?Bladder - no incontinence  ?  ?Lives with wife ?Occupation: prior worked with Clinical cytogeneticist as Recruitment consultant; volunteers at [employer] for [place] in [place], ?Edu: BS  ?Activity: limited gym activity [facility]) due to pandemic  ?Diet:  some water, fruits/vegetables daily ?   ? ?Relevant past medical, surgical, family and social history reviewed and updated as indicated. Interim medical history since our last visit reviewed. ?Allergies and medications reviewed and updated. ?Outpatient Medications Prior to Visit  ?Medication Sig Dispense Refill  ? acetaminophen (TYLENOL) 650 MG CR tablet Take 1,300 mg by mouth at bedtime.    ? apixaban (ELIQUIS) 5 MG TABS tablet Take 1 tablet (5 mg total) by mouth 2 (two) times daily. 180 tablet 1  ? Apoaequorin (PREVAGEN PO) Take by mouth daily.    ? calcium carbonate (TUMS - DOSED IN MG ELEMENTAL CALCIUM) 500 MG chewable tablet Chew 1 tablet by  mouth as needed for indigestion or heartburn.    ? carvedilol (COREG) 3.125 MG tablet Take 1 tablet (3.125 mg total) by mouth 2 (two) times daily. 180 tablet 3  ? Cyanocobalamin (B-12) 5000 MCG SUBL Place 5,000 mcg under the tongue as needed.    ? dutasteride (AVODART) 0.5 MG capsule TAKE 1 CAPSULE BY MOUTH EVERY OTHER DAY 45 capsule 0  ? Glucosamine-Chondroitin (OSTEO BI-FLEX REGULAR STRENGTH PO) Take 1 tablet by mouth at bedtime.    ? Krill Oil 500 MG CAPS Take 1 capsule by mouth daily.     ? magnesium gluconate (MAGONATE) 500 MG tablet Take 500 mg by mouth daily.    ? MISC NATURAL PRODUCTS EX Apply topically. Cannabis cream    ? Multiple Vitamins-Minerals (MULTIVITAMIN WITH MINERALS) tablet Take 1 tablet by mouth daily.    ? potassium gluconate 595 (99 K) MG TABS tablet Take 595 mg by mouth at bedtime.    ? telmisartan (MICARDIS) 80 MG tablet TAKE 1 TABLET BY MOUTH EVERY DAY 90 tablet 0  ? ?No facility-administered medications prior to visit.  ?  ? ?Per HPI unless specifically indicated in ROS section below ?Review of Systems  ?Constitutional:  Negative for activity change, appetite change, chills, fatigue, fever and unexpected weight change.  ?HENT:  Negative for hearing loss.   ?Eyes:  Negative for visual disturbance.  ?Respiratory:  Negative for cough, chest tightness, shortness of breath and wheezing.   ?Cardiovascular:  Negative for chest pain, palpitations and leg swelling.  ?Gastrointestinal:  Negative for abdominal distention, abdominal pain, blood in stool, constipation, diarrhea, nausea and vomiting.  ?Genitourinary:  Negative for difficulty urinating and hematuria.  ?Musculoskeletal:  Negative for arthralgias, myalgias and neck pain.  ?Skin:  Negative for rash.  ?Neurological:  Negative for dizziness, seizures, syncope and headaches.  ?Hematological:  Negative for adenopathy. Does not bruise/bleed easily.  ?Psychiatric/Behavioral:  Negative for dysphoric mood. The patient is not nervous/anxious.    ? ?Objective:  ?BP 134/76   Pulse 71   Temp 97.7 ?F (36.5 ?C) (Temporal)   Ht 5' 9.75" (1.772 m)   Wt 173 lb 8 oz (78.7 kg)   SpO2 97%   BMI 25.07 kg/m?   ?Wt Readings from Last 3 Encounters:  ?10/25/21 173 lb 8 oz (78.7 kg)  ?05/26/21 172 lb (78 kg)  ?04/21/21 173 lb (78.5 kg)  ?  ?  ?Physical Exam ?Vitals and nursing note reviewed.  ?Constitutional:   ?   General: He is not in acute distress. ?   Appearance: Normal appearance. He is well-developed. He is not ill-appearing.  ?HENT:  ?   Head: Normocephalic and atraumatic.  ?   Right Ear: Hearing, tympanic membrane, ear canal and external ear normal.  ?   Left Ear: Hearing, tympanic membrane, ear canal and external ear  normal.  ?Eyes:  ?   General: No scleral icterus. ?   Extraocular Movements: Extraocular movements intact.  ?   Conjunctiva/sclera: Conjunctivae normal.  ?   Pupils: Pupils are equal, round, and reactive to light.  ?Neck:  ?   Thyroid: No thyroid mass or thyromegaly.  ?Cardiovascular:  ?   Rate and Rhythm: Normal rate. Rhythm irregularly irregular.  ?   Pulses: Normal pulses.     ?     Radial pulses are 2+ on the right side and 2+ on the left side.  ?   Heart sounds: Normal heart sounds. No murmur heard. ?Pulmonary:  ?   Effort: Pulmonary effort is normal. No respiratory distress.  ?   Breath sounds: Normal breath sounds. No wheezing, rhonchi or rales.  ?Abdominal:  ?   General: Bowel sounds are normal. There is no distension.  ?   Palpations: Abdomen is soft. There is no mass.  ?   Tenderness: There is no abdominal tenderness. There is no guarding or rebound.  ?   Hernia: No hernia is present.  ?Musculoskeletal:     ?   General: Normal range of motion.  ?   Cervical back: Normal range of motion and neck supple.  ?   Right lower leg: No edema.  ?   Left lower leg: No edema.  ?Lymphadenopathy:  ?   Cervical: No cervical adenopathy.  ?Skin: ?   General: Skin is warm and dry.  ?   Findings: No rash.  ?Neurological:  ?   General: No focal deficit  present.  ?   Mental Status: He is alert and oriented to person, place, and time.  ?   Comments: No cognitive concerns identified on exam today.   ?Psychiatric:     ?   Mood and Affect: Mood normal.     ?   Behavior: Beh

## 2021-10-25 NOTE — Patient Instructions (Addendum)
Send [place] dates of latest COVID vaccine.  ?Lower back xray today  ?Good to see you today ?Return in 1 year for next wellness visit/physical.  ? ?Health Maintenance After Age 86 ?After age 46, you are at a higher risk for certain long-term diseases and infections as well as injuries from falls. Falls are a major cause of broken bones and head injuries in people who are older than age 46. Getting regular preventive care can help to keep you healthy and well. Preventive care includes getting regular testing and making lifestyle changes as recommended by your health care provider. Talk with your health care provider about: ?Which screenings and tests you should have. A screening is a test that checks for a disease when you have no symptoms. ?A diet and exercise plan that is right for you. ?What should I know about screenings and tests to prevent falls? ?Screening and testing are the best ways to find a health problem early. Early diagnosis and treatment give you the best chance of managing medical conditions that are common after age 69. Certain conditions and lifestyle choices may make you more likely to have a fall. Your health care provider may recommend: ?Regular vision checks. Poor vision and conditions such as cataracts can make you more likely to have a fall. If you wear glasses, make sure to get your prescription updated if your vision changes. ?Medicine review. Work with your health care provider to regularly review all of the medicines you are taking, including over-the-counter medicines. Ask your health care provider about any side effects that may make you more likely to have a fall. Tell your health care provider if any medicines that you take make you feel dizzy or sleepy. ?Strength and balance checks. Your health care provider may recommend certain tests to check your strength and balance while standing, walking, or changing positions. ?Foot health exam. Foot pain and numbness, as well as not wearing proper  footwear, can make you more likely to have a fall. ?Screenings, including: ?Osteoporosis screening. Osteoporosis is a condition that causes the bones to get weaker and break more easily. ?Blood pressure screening. Blood pressure changes and medicines to control blood pressure can make you feel dizzy. ?Depression screening. You may be more likely to have a fall if you have a fear of falling, feel depressed, or feel unable to do activities that you used to do. ?Alcohol use screening. Using too much alcohol can affect your balance and may make you more likely to have a fall. ?Follow these instructions at home: ?Lifestyle ?Do not drink alcohol if: ?Your health care provider tells you not to drink. ?If you drink alcohol: ?Limit how much you have to: ?0-1 drink a day for women. ?0-2 drinks a day for men. ?Know how much alcohol is in your drink. In the [place] one drink equals one 12 oz bottle of beer (355 mL), one 5 oz glass of wine (148 mL), or one 1? oz glass of hard liquor (44 mL). ?Do not use any products that contain nicotine or tobacco. These products include cigarettes, chewing tobacco, and vaping devices, such as e-cigarettes. If you need help quitting, ask your health care provider. ?Activity ? ?Follow a regular exercise program to stay fit. This will help you maintain your balance. Ask your health care provider what types of exercise are appropriate for you. ?If you need a cane or walker, use it as recommended by your health care provider. ?Wear supportive shoes that have nonskid soles. ?Safety ? ?  Remove any tripping hazards, such as rugs, cords, and clutter. ?Install safety equipment such as grab bars in bathrooms and safety rails on stairs. ?Keep rooms and walkways well-lit. ?General instructions ?Talk with your health care provider about your risks for falling. Tell your health care provider if: ?You fall. Be sure to tell your health care provider about all falls, even ones that seem minor. ?You feel dizzy,  tiredness (fatigue), or off-balance. ?Take over-the-counter and prescription medicines only as told by your health care provider. These include supplements. ?Eat a healthy diet and maintain a healthy weight. A healthy diet includes low-fat dairy products, low-fat (lean) meats, and fiber from whole grains, beans, and lots of fruits and vegetables. ?Stay current with your vaccines. ?Schedule regular health, dental, and eye exams. ?Summary ?Having a healthy lifestyle and getting preventive care can help to protect your health and wellness after age 40. ?Screening and testing are the best way to find a health problem early and help you avoid having a fall. Early diagnosis and treatment give you the best chance for managing medical conditions that are more common for people who are older than age 94. ?Falls are a major cause of broken bones and head injuries in people who are older than age 14. Take precautions to prevent a fall at home. ?Work with your health care provider to learn what changes you can make to improve your health and wellness and to prevent falls. ?This information is not intended to replace advice given to you by your health care provider. Make sure you discuss any questions you have with your health care provider. ?Document Revised: 11/29/2020 Document Reviewed: 11/29/2020 ?Elsevier Patient Education ? [place]. ? ?

## 2021-10-26 ENCOUNTER — Other Ambulatory Visit (HOSPITAL_COMMUNITY): Payer: Self-pay

## 2021-10-27 DIAGNOSIS — M1712 Unilateral primary osteoarthritis, left knee: Secondary | ICD-10-CM | POA: Diagnosis not present

## 2021-10-31 ENCOUNTER — Encounter: Payer: Self-pay | Admitting: Family Medicine

## 2021-11-03 ENCOUNTER — Other Ambulatory Visit: Payer: Self-pay | Admitting: Family Medicine

## 2021-11-03 DIAGNOSIS — M1712 Unilateral primary osteoarthritis, left knee: Secondary | ICD-10-CM | POA: Diagnosis not present

## 2021-11-03 DIAGNOSIS — H903 Sensorineural hearing loss, bilateral: Secondary | ICD-10-CM | POA: Diagnosis not present

## 2021-11-10 DIAGNOSIS — M1712 Unilateral primary osteoarthritis, left knee: Secondary | ICD-10-CM | POA: Diagnosis not present

## 2021-11-23 ENCOUNTER — Ambulatory Visit: Payer: PPO | Admitting: Urology

## 2021-11-23 ENCOUNTER — Encounter: Payer: Self-pay | Admitting: Urology

## 2021-11-23 VITALS — BP 147/69 | HR 50 | Ht 69.75 in | Wt 170.0 lb

## 2021-11-23 DIAGNOSIS — R310 Gross hematuria: Secondary | ICD-10-CM | POA: Diagnosis not present

## 2021-11-23 DIAGNOSIS — N401 Enlarged prostate with lower urinary tract symptoms: Secondary | ICD-10-CM

## 2021-11-23 DIAGNOSIS — Z87898 Personal history of other specified conditions: Secondary | ICD-10-CM

## 2021-11-23 LAB — URINALYSIS, COMPLETE
Bilirubin, UA: NEGATIVE
Glucose, UA: NEGATIVE
Ketones, UA: NEGATIVE
Leukocytes,UA: NEGATIVE
Nitrite, UA: NEGATIVE
Protein,UA: NEGATIVE
RBC, UA: NEGATIVE
Specific Gravity, UA: 1.015 (ref 1.005–1.030)
Urobilinogen, Ur: 0.2 mg/dL (ref 0.2–1.0)
pH, UA: 7 (ref 5.0–7.5)

## 2021-11-23 LAB — MICROSCOPIC EXAMINATION
Bacteria, UA: NONE SEEN
Epithelial Cells (non renal): NONE SEEN /hpf (ref 0–10)

## 2021-11-23 NOTE — Progress Notes (Signed)
? ?11/23/2021 ?11:14 AM  ? ?Michael Decker ?1933-08-13 ?[id number] ? ?Referring provider: Ria Bush, MD ?[address] ?[place],  [place] [address] ? ?Chief Complaint  ?Patient presents with  ? Follow-up  ? ? ?Urologic history: ?1.  BPH with LUTS ?Dutasteride ? ?2.  Gross hematuria ?Initial episode 2014 ?No upper tract abnormalities and cystoscopy showing BPH ?Recurrent episode ~ 2021 ?CTU 04/2021 no upper tract abnormalities ?Cystoscopy 05/2021 with BPH/hypervascularity; no bladder mucosal abnormalities ? ?HPI: ?88 y.o. male presents for 6 month follow-up ? ?Doing well since last visit ?No bothersome LUTS ?Denies dysuria, gross hematuria ?Denies flank, abdominal or pelvic pain ?IPSS today 5/35 ? ? ? ?PMH: ?Past Medical History:  ?Diagnosis Date  ? Arthritis   ? knee  ? BPH (benign prostatic hypertrophy)   ? on avodart, released from urologist care  ? Colon polyps 2013  ? TA 2013 Henrene Pastor)  ? Diverticulosis   ? GERD (gastroesophageal reflux disease)   ? Hematospermia   ? s/p uro eval  ? HLD (hyperlipidemia)   ? mild  ? HTN (hypertension)   ? Wears hearing aid in left ear   ? ? ?Surgical History: ?Past Surgical History:  ?Procedure Laterality Date  ? BUNIONECTOMY Right 2014  ? CARDIOVERSION N/A 07/09/2020  ? Procedure: CARDIOVERSION;  Surgeon: Wellington Hampshire, MD;  Location: [facility];  Service: Cardiovascular;  Laterality: N/A;  ? CATARACT EXTRACTION W/PHACO Right 08/14/2018  ? Procedure: CATARACT EXTRACTION PHACO AND INTRAOCULAR LENS PLACEMENT ([place])  RIGHT;  Surgeon: Leandrew Koyanagi, MD;  Location: [place];  Service: Ophthalmology;  Laterality: Right;  requests arrival between 830 & 1030  ? CATARACT EXTRACTION W/PHACO Left 09/11/2018  ? Procedure: CATARACT EXTRACTION PHACO AND INTRAOCULAR LENS PLACEMENT ([place])  LEFT;  Surgeon: Leandrew Koyanagi, MD;  Location: [place];  Service: Ophthalmology;  Laterality: Left;  Doesn't want to be early  ? COLONOSCOPY  2008  ? COLONOSCOPY   01/2012  ? TA x1, diverticulosis, rpt 5 yrs Henrene Pastor)  ? COLONOSCOPY  02/2017  ? TA, diverticulosis, int hem no f/u needed Henrene Pastor)  ? CYSTECTOMY    ? TONSILLECTOMY  1944  ? UPPER GI ENDOSCOPY    ? ? ?Home Medications:  ?Allergies as of 11/23/2021   ? ?   Reactions  ? Metoprolol Rash  ? ?  ? ?  ?Medication List  ?  ? ?  ? Accurate as of Nov 23, 2021 11:14 AM. If you have any questions, ask your nurse or doctor.  ?  ?  ? ?  ? ?acetaminophen 650 MG CR tablet ?Commonly known as: TYLENOL ?Take 1,300 mg by mouth at bedtime. ?  ?apixaban 5 MG Tabs tablet ?Commonly known as: Eliquis ?Take 1 tablet (5 mg total) by mouth 2 (two) times daily. ?  ?B-12 5000 MCG Subl ?Place 5,000 mcg under the tongue as needed. ?  ?calcium carbonate 500 MG chewable tablet ?Commonly known as: TUMS - dosed in mg elemental calcium ?Chew 1 tablet by mouth as needed for indigestion or heartburn. ?  ?carvedilol 3.125 MG tablet ?Commonly known as: COREG ?Take 1 tablet (3.125 mg total) by mouth 2 (two) times daily. ?  ?dutasteride 0.5 MG capsule ?Commonly known as: AVODART ?TAKE 1 CAPSULE BY MOUTH EVERY OTHER DAY ?  ?Krill Oil 500 MG Caps ?Take 1 capsule by mouth daily. ?  ?magnesium gluconate 500 MG tablet ?Commonly known as: MAGONATE ?Take 500 mg by mouth daily. ?  ?MISC NATURAL PRODUCTS EX ?Apply topically.  Cannabis cream ?  ?multivitamin with minerals tablet ?Take 1 tablet by mouth daily. ?  ?OSTEO BI-FLEX REGULAR STRENGTH PO ?Take 1 tablet by mouth at bedtime. ?  ?potassium gluconate 595 (99 K) MG Tabs tablet ?Take 595 mg by mouth at bedtime. ?  ?PREVAGEN PO ?Take by mouth daily. ?  ?telmisartan 80 MG tablet ?Commonly known as: MICARDIS ?TAKE 1 TABLET BY MOUTH EVERY DAY ?  ? ?  ? ? ?Allergies:  ?Allergies  ?Allergen Reactions  ? Metoprolol Rash  ? ? ?Family History: ?Family History  ?Problem Relation Age of Onset  ? Sudden death Father 7  ?     ?MI  ? Heart disease Brother 79  ?     ?afib  ? Cancer Neg Hx   ? Diabetes Neg Hx   ? Stroke Neg Hx   ? Colon  cancer Neg Hx   ? Esophageal cancer Neg Hx   ? Stomach cancer Neg Hx   ? ? ?Social History:  reports that he has quit smoking. He has never used smokeless tobacco. He reports current alcohol use. He reports that he does not use drugs. ? ? ?Physical Exam: ?BP (!) 147/69   Pulse (!) 50   Ht 5' 9.75" (1.772 m)   Wt 170 lb (77.1 kg)   BMI 24.57 kg/m?   ?Constitutional:  Alert and oriented, No acute distress. ?HEENT: [place] AT, moist mucus membranes.  ?Respiratory: Normal respiratory effort, no increased work of breathing. ?Psychiatric: Normal mood and affect. ? ?Laboratory Data: ? ?Urinalysis ?Dipstick/microscopy negative ? ? ? ? ?Assessment & Plan:   ? ?1.  History gross hematuria ?UA today clear ? ?2.  BPH with LUTS ?Mild lower urinary tract symptoms ?Continue Avodart ? ?He would like to follow-up as needed for bothersome LUTS or recurrent gross hematuria and lieu of annual follow-up ? ? ? ?Abbie Sons, MD ? ?[facility] ?[address] ?[place], [place] [address] ?[phone] ? ?

## 2021-12-22 ENCOUNTER — Telehealth: Payer: Self-pay

## 2021-12-22 NOTE — Progress Notes (Signed)
Chronic Care Management Pharmacy Assistant   Name: Michael Decker  MRN: [id number] DOB: [birth year]  Reason for Encounter: CCM (Hypertension Disease State)  Recent office visits:  10/25/21 AWV  Recent consult visits:  11/23/2021 John Giovanni, MD Gross Hematuria  11/10/21 Milagros Evener, MD (Orthopedic Surgery) Left Knee Synvisc injection. Administered: injection of 2 cc's synvisc 11/03/21 Milagros Evener, MD (Orthopedic Surgery) Left Knee Synvisc injection. Administered: injection of 2 cc's synvisc 10/27/21 Milagros Evener, MD (Orthopedic Surgery) Left Knee Synvisc injection. Administered: injection of 2 cc's synvisc  Hospital visits:  None in previous 6 months  Medications: Outpatient Encounter Medications as of 12/22/2021  Medication Sig   acetaminophen (TYLENOL) 650 MG CR tablet Take 1,300 mg by mouth at bedtime.   apixaban (ELIQUIS) 5 MG TABS tablet Take 1 tablet (5 mg total) by mouth 2 (two) times daily.   Apoaequorin (PREVAGEN PO) Take by mouth daily.   calcium carbonate (TUMS - DOSED IN MG ELEMENTAL CALCIUM) 500 MG chewable tablet Chew 1 tablet by mouth as needed for indigestion or heartburn.   carvedilol (COREG) 3.125 MG tablet Take 1 tablet (3.125 mg total) by mouth 2 (two) times daily.   Cyanocobalamin (B-12) 5000 MCG SUBL Place 5,000 mcg under the tongue as needed.   dutasteride (AVODART) 0.5 MG capsule TAKE 1 CAPSULE BY MOUTH EVERY OTHER DAY   Glucosamine-Chondroitin (OSTEO BI-FLEX REGULAR STRENGTH PO) Take 1 tablet by mouth at bedtime.   Krill Oil 500 MG CAPS Take 1 capsule by mouth daily.    magnesium gluconate (MAGONATE) 500 MG tablet Take 500 mg by mouth daily.   MISC NATURAL PRODUCTS EX Apply topically. Cannabis cream   Multiple Vitamins-Minerals (MULTIVITAMIN WITH MINERALS) tablet Take 1 tablet by mouth daily.   potassium gluconate 595 (99 K) MG TABS tablet Take 595 mg by mouth at bedtime.   telmisartan (MICARDIS) 80 MG tablet TAKE 1 TABLET BY MOUTH EVERY DAY   No  facility-administered encounter medications on file as of 12/22/2021.    Recent Office Vitals: BP Readings from Last 3 Encounters:  11/23/21 (!) 147/69  10/25/21 134/76  05/26/21 (!) 150/82   Pulse Readings from Last 3 Encounters:  11/23/21 (!) 50  10/25/21 71  05/26/21 (!) 57    Wt Readings from Last 3 Encounters:  11/23/21 170 lb (77.1 kg)  10/25/21 173 lb 8 oz (78.7 kg)  05/26/21 172 lb (78 kg)     Kidney Function Lab Results  Component Value Date/Time   CREATININE 1.19 10/18/2021 08:07 AM   CREATININE 1.20 05/23/2021 11:15 AM   CREATININE 1.24 06/17/2013 12:00 AM   GFR 55.69 (L) 10/18/2021 08:07 AM   GFRNONAA >60 07/07/2020 09:57 AM       Latest Ref Rng & Units 10/18/2021    8:07 AM 05/23/2021   11:15 AM 07/07/2020    9:57 AM  BMP  Glucose 70 - 99 mg/dL 91    88    BUN 6 - 23 mg/dL 25    22    Creatinine 0.40 - 1.50 mg/dL 1.19   1.20   1.14    Sodium 135 - 145 mEq/L 139    137    Potassium 3.5 - 5.1 mEq/L 4.5    4.5    Chloride 96 - 112 mEq/L 104    101    CO2 19 - 32 mEq/L 31    29    Calcium 8.4 - 10.5 mg/dL 9.3    9.3     Contacted  patient on 12/22/2021 to discuss hypertension disease state  Current antihypertensive regimen:  Carvedilol 3.125 mg BID Telmisartan 80 mg daily   Patient verbally confirms he is taking the above medications as directed. Yes  How often are you checking your Blood Pressure? infrequently - I have asked patient to take his blood pressure at home and keep a log. I advised him I would call back on 06/09 for his log. Patient agreed.   Wrist or arm cuff: Arm cuff Caffeine intake: Patient drinks decaf coffee Salt intake: Patient watches his salt Over the counter medications including pseudoephedrine or NSAIDs? Patient takes Tylenol  Any readings above 180/120? No  What recent interventions/DTPs have been made by any provider to improve Blood Pressure control since last CPP Visit: Continue current medication  Any recent  hospitalizations or ED visits since last visit with CPP? No  What diet changes have been made to improve Blood Pressure Control?  Patient eats a diet of vegetables and grilled meats.   What exercise is being done to improve your Blood Pressure Control?  Patient works out at [employer]  Adherence Review: Is the patient currently on ACE/ARB medication? Yes Does the patient have >5 day gap between last estimated fill dates? No  Star Rating Drugs:  Medication:  Last Fill: Day Supply Telmisartan 80 mg 10/25/2021 90  Care Gaps: Annual wellness visit in last year? Yes 10/25/2021 Most Recent BP reading: 147/69 on 11/23/2021  Upcoming appointments: CCM appointment on 04/20/2022  Charlene Brooke, CPP notified  Marijean Niemann, [place] Assistant [phone]

## 2021-12-28 ENCOUNTER — Ambulatory Visit: Payer: PPO | Admitting: Urology

## 2021-12-28 ENCOUNTER — Encounter: Payer: Self-pay | Admitting: Urology

## 2021-12-28 VITALS — BP 143/61 | HR 50 | Ht 69.75 in | Wt 172.0 lb

## 2021-12-28 DIAGNOSIS — R361 Hematospermia: Secondary | ICD-10-CM | POA: Diagnosis not present

## 2021-12-28 NOTE — Progress Notes (Signed)
12/28/2021 2:06 PM   Michael Decker 1934-05-14 [id number]  Referring provider: Ria Bush, MD [place],  [place] [address]  No chief complaint on file.   Urologic history: 1.  BPH with LUTS Dutasteride  2.  Gross hematuria Initial episode 2014 No upper tract abnormalities and cystoscopy showing BPH Recurrent episode ~ 2021 CTU 04/2021 no upper tract abnormalities Cystoscopy 05/2021 with BPH/hypervascularity; no bladder mucosal abnormalities  HPI: 88 y.o. male presents for 6 month follow-up  Seen at last month for a semiannual visit and called for an acute visit due to recurrent hematospermia which has happened on 2-3 occasions over the last month Stable LUTS on dutasteride  Denies gross hematuria PSA 09/2021 was 0.31 UA performed last month was unremarkable    PMH: Past Medical History:  Diagnosis Date   Arthritis    knee   BPH (benign prostatic hypertrophy)    on avodart, released from urologist care   Colon polyps 2013   TA 2013 Henrene Pastor)   Diverticulosis    GERD (gastroesophageal reflux disease)    Hematospermia    s/p uro eval   HLD (hyperlipidemia)    mild   HTN (hypertension)    Wears hearing aid in left ear     Surgical History: Past Surgical History:  Procedure Laterality Date   BUNIONECTOMY Right 2014   CARDIOVERSION N/A 07/09/2020   Procedure: CARDIOVERSION;  Surgeon: Wellington Hampshire, MD;  Location: [facility];  Service: Cardiovascular;  Laterality: N/A;   CATARACT EXTRACTION W/PHACO Right 08/14/2018   Procedure: CATARACT EXTRACTION PHACO AND INTRAOCULAR LENS PLACEMENT ([place])  RIGHT;  Surgeon: Leandrew Koyanagi, MD;  Location: [facility];  Service: Ophthalmology;  Laterality: Right;  requests arrival between 830 & 1030   CATARACT EXTRACTION W/PHACO Left 09/11/2018   Procedure: CATARACT EXTRACTION PHACO AND INTRAOCULAR LENS PLACEMENT ([place])  LEFT;  Surgeon: Leandrew Koyanagi, MD;  Location: [facility];   Service: Ophthalmology;  Laterality: Left;  Doesn't want to be early   COLONOSCOPY  2008   COLONOSCOPY  01/2012   TA x1, diverticulosis, rpt 5 yrs Henrene Pastor)   COLONOSCOPY  02/2017   TA, diverticulosis, int hem no f/u needed Henrene Pastor)   CYSTECTOMY     TONSILLECTOMY  1944   UPPER GI ENDOSCOPY      Home Medications:  Allergies as of 12/28/2021       Reactions   Metoprolol Rash        Medication List        Accurate as of December 28, 2021  2:06 PM. If you have any questions, ask your nurse or doctor.          acetaminophen 650 MG CR tablet Commonly known as: TYLENOL Take 1,300 mg by mouth at bedtime.   apixaban 5 MG Tabs tablet Commonly known as: Eliquis Take 1 tablet (5 mg total) by mouth 2 (two) times daily.   B-12 5000 MCG Subl Place 5,000 mcg under the tongue as needed.   calcium carbonate 500 MG chewable tablet Commonly known as: TUMS - dosed in mg elemental calcium Chew 1 tablet by mouth as needed for indigestion or heartburn.   carvedilol 3.125 MG tablet Commonly known as: COREG Take 1 tablet (3.125 mg total) by mouth 2 (two) times daily.   dutasteride 0.5 MG capsule Commonly known as: AVODART TAKE 1 CAPSULE BY MOUTH EVERY OTHER DAY   Krill Oil 500 MG Caps Take 1 capsule by mouth daily.   magnesium gluconate  500 MG tablet Commonly known as: MAGONATE Take 500 mg by mouth daily.   MISC NATURAL PRODUCTS EX Apply topically. Cannabis cream   multivitamin with minerals tablet Take 1 tablet by mouth daily.   OSTEO BI-FLEX REGULAR STRENGTH PO Take 1 tablet by mouth at bedtime.   potassium gluconate 595 (99 K) MG Tabs tablet Take 595 mg by mouth at bedtime.   PREVAGEN PO Take by mouth daily.   telmisartan 80 MG tablet Commonly known as: MICARDIS TAKE 1 TABLET BY MOUTH EVERY DAY        Allergies:  Allergies  Allergen Reactions   Metoprolol Rash    Family History: Family History  Problem Relation Age of Onset   Sudden death Father 56        ?MI   Heart disease Brother 70       ?afib   Cancer Neg Hx    Diabetes Neg Hx    Stroke Neg Hx    Colon cancer Neg Hx    Esophageal cancer Neg Hx    Stomach cancer Neg Hx     Social History:  reports that he has quit smoking. He has never used smokeless tobacco. He reports current alcohol use. He reports that he does not use drugs.   Physical Exam: BP (!) 143/61   Pulse (!) 50   Ht 5' 9.75" (1.772 m)   Wt 172 lb (78 kg)   BMI 24.86 kg/m   Constitutional:  Alert and oriented, No acute distress. HEENT: [place] AT, moist mucus membranes.  Respiratory: Normal respiratory effort, no increased work of breathing. GU: 50 g, smooth without nodules or induration Psychiatric: Normal mood and affect.   Assessment & Plan:    1.  Hematospermia Asymptomatic Benign DRE PSA March 2023 0.31 Patient was reassured that the vast majority of hematospermia cases are of benign etiology and typically secondary to BPH/inflammation.  With benign DRE and normal PSA he was informed the possibility of significant pathology is very low He would like to return as needed for worsening voiding symptoms, hematospermia or recurrent gross hematuria   Abbie Sons, MD  [facility] [address] [place], [place] [address] [phone]

## 2021-12-29 ENCOUNTER — Other Ambulatory Visit: Payer: Self-pay

## 2021-12-29 MED ORDER — CARVEDILOL 3.125 MG PO TABS
3.1250 mg | ORAL_TABLET | Freq: Two times a day (BID) | ORAL | 0 refills | Status: DC
Start: 2021-12-29 — End: 2022-02-03

## 2021-12-31 ENCOUNTER — Encounter: Payer: Self-pay | Admitting: Urology

## 2022-01-02 ENCOUNTER — Telehealth: Payer: Self-pay

## 2022-01-02 NOTE — Telephone Encounter (Signed)
Patient called in stating that he was supposed to receive a call on Friday to check in on blood pressure readings, but never received the call. States he documented them and will print it and bring by the paper by tomorrow.

## 2022-01-03 ENCOUNTER — Telehealth: Payer: Self-pay | Admitting: Family Medicine

## 2022-01-03 NOTE — Telephone Encounter (Addendum)
BP readings reviewed - overall good control - 110-140s/50-80s.  Pulses run low to normal - 49-80.  Continue current BP meds as long as not having low pulse symptoms of dizziness or dyspnea.  BP log will be scanned into chart.

## 2022-01-03 NOTE — Telephone Encounter (Signed)
Pt came in office to drop off Blood pressure reading . It was place in PCP folder

## 2022-01-03 NOTE — Telephone Encounter (Signed)
Patient notified as instructed by telephone and verbalized understanding. Patient stated that he occasionally gets a little dizzy but it only last for about 4-5 seconds. Patient stated that the dizzy spells never makes him feel like he is going to fall.

## 2022-01-03 NOTE — Progress Notes (Signed)
Called patient for his blood pressure log as previously discussed. Per his wife patient brought readings in to office.   Charlene Brooke, CPP notified  Marijean Niemann, [place] Clinical Pharmacy Assistant [phone]

## 2022-01-03 NOTE — Progress Notes (Signed)
Called patient for blood pressure readings as previously discussed. Per patient's wife he brought them in to the office.  Charlene Brooke, CPP notified  Marijean Niemann, [place] Clinical Pharmacy Assistant [phone]

## 2022-01-03 NOTE — Telephone Encounter (Signed)
Placed in Dr. G's box.  

## 2022-01-05 ENCOUNTER — Encounter: Payer: Self-pay | Admitting: Family Medicine

## 2022-01-31 ENCOUNTER — Ambulatory Visit: Payer: HMO | Admitting: Physician Assistant

## 2022-02-01 ENCOUNTER — Other Ambulatory Visit: Payer: Self-pay | Admitting: Family Medicine

## 2022-02-02 ENCOUNTER — Encounter: Payer: Self-pay | Admitting: Cardiology

## 2022-02-03 MED ORDER — METOPROLOL SUCCINATE ER 25 MG PO TB24: 12.5000 mg | ORAL_TABLET | Freq: Every day | ORAL | 11 refills | Status: DC

## 2022-02-05 NOTE — Progress Notes (Deleted)
Cardiology Office Note    Date:  02/05/2022   ID:  Michael Decker, DOB Oct 27, 1933, MRN [id number]  PCP:  Ria Bush, MD  Cardiologist:  Vickie Epley, MD  Electrophysiologist:  Vickie Epley, MD   Chief Complaint: Follow up  History of Present Illness:   Michael Decker is a 88 y.o. male with history of permanent A-fib, HFrEF, HTN, HLD, GERD, BPH, and arthritis who presents for follow up of Afib and HFrEF.  He established care with Dr. Quentin Ore in 05/2020 for evaluation of A-fib and dizziness.  Plan was to initially anticoagulate and pursue DCCV.  He underwent briefly successful DCCV on 07/09/2020, though within 3 minutes of cardioversion he went back into A-fib.  Echo in 06/2020 showed an EF of 45 to 50%, indeterminate LV diastolic function parameters, normal RV systolic function with mildly enlarged ventricular cavity size, mildly dilated left atrium, moderately dilated right atrium, mild to moderate mitral regurgitation, mild to moderate aortic valve sclerosis without evidence of stenosis, mild dilatation of the ascending aorta measuring 40 mm, and an estimated right atrial pressure of 8 mmHg.  Metoprolol has previously been transitioned to carvedilol secondary to rash.  Medication escalation has been limited at times due to concerns for potential off target effects.  Rate control strategy for his A-fib has been pursued.  He was last seen in the office in 01/2021 and was without symptoms of angina or decompensation.  More recently, received a MyChart message on 02/02/2022 noting intermittent lightheadedness with patient noting heart rates rarely getting over the lower 70s bpm, even with exertion.  Dr. Quentin Ore subsequently recommended the patient stop carvedilol altogether and initiate metoprolol succinate 12.5 mg daily in the evening.  ***   Labs independently reviewed: 09/2021 - TC 143, TG 106, HDL 56, LDL 65, potassium 4.5, BUN 25, serum creatinine 1.19, albumin 4.0, AST/ALT  normal, Hgb 15.3, PLT 183 05/2020 - TSH normal  Past Medical History:  Diagnosis Date   Arthritis    knee   BPH (benign prostatic hypertrophy)    on avodart, released from urologist care   Colon polyps 2013   TA 2013 Henrene Pastor)   Diverticulosis    GERD (gastroesophageal reflux disease)    Hematospermia    s/p uro eval   HLD (hyperlipidemia)    mild   HTN (hypertension)    Wears hearing aid in left ear     Past Surgical History:  Procedure Laterality Date   BUNIONECTOMY Right 2014   CARDIOVERSION N/A 07/09/2020   Procedure: CARDIOVERSION;  Surgeon: Wellington Hampshire, MD;  Location: [facility];  Service: Cardiovascular;  Laterality: N/A;   CATARACT EXTRACTION W/PHACO Right 08/14/2018   Procedure: CATARACT EXTRACTION PHACO AND INTRAOCULAR LENS PLACEMENT ([place])  RIGHT;  Surgeon: Leandrew Koyanagi, MD;  Location: [facility];  Service: Ophthalmology;  Laterality: Right;  requests arrival between 830 & 1030   CATARACT EXTRACTION W/PHACO Left 09/11/2018   Procedure: CATARACT EXTRACTION PHACO AND INTRAOCULAR LENS PLACEMENT ([place])  LEFT;  Surgeon: Leandrew Koyanagi, MD;  Location: [facility];  Service: Ophthalmology;  Laterality: Left;  Doesn't want to be early   COLONOSCOPY  2008   COLONOSCOPY  01/2012   TA x1, diverticulosis, rpt 5 yrs Henrene Pastor)   COLONOSCOPY  02/2017   TA, diverticulosis, int hem no f/u needed Henrene Pastor)   CYSTECTOMY     TONSILLECTOMY  1944   UPPER GI ENDOSCOPY      Current Medications: No outpatient medications have been marked  as taking for the 02/06/22 encounter (Appointment) with Rise Mu, PA-C.    Allergies:   Metoprolol   Social History   Socioeconomic History   Marital status: Married    Spouse name: Not on file   Number of children: Not on file   Years of education: Not on file   Highest education level: Not on file  Occupational History   Not on file  Tobacco Use   Smoking status: Former   Smokeless tobacco: Never   Tobacco  comments:    cigars back in the 60's  Vaping Use   Vaping Use: Never used  Substance and Sexual Activity   Alcohol use: Yes    Comment: occassionally-beer or wine   Drug use: No   Sexual activity: Yes    Partners: Female  Other Topics Concern   Not on file  Social History Narrative   Lives with wife   Grown children   Occupation: prior worked with Clinical cytogeneticist as Recruitment consultant; volunteers at [employer] in [place]: BS   Activity: works out at [employer] 3x/wk, on recumbent bike   Diet: some water, fruits/vegetables daily         Social Determinants of Health   Financial Resource Strain: [place]  (04/04/2021)   Overall Financial Resource Strain (CARDIA)    Difficulty of Paying Living Expenses: Not very hard  Food Insecurity: No Food Insecurity (10/18/2021)   Hunger Vital Sign    Worried About Running Out of Food in the Last Year: Never true    [place] in the Last Year: Never true  Transportation Needs: No Transportation Needs (10/18/2021)   PRAPARE - Hydrologist (Medical): No    Lack of Transportation (Non-Medical): No  Physical Activity: Not on file  Stress: Not on file  Social Connections: Not on file     Family History:  The patient's family history includes Heart disease (age of onset: 24) in his brother; Sudden death (age of onset: 75) in his father. There is no history of Cancer, Diabetes, Stroke, Colon cancer, Esophageal cancer, or Stomach cancer.  ROS:   ROS   EKGs/Labs/Other Studies Reviewed:    Studies reviewed were summarized above. The additional studies were reviewed today:  2D echo 07/13/2020:  1. Left ventricular ejection fraction, by estimation, is 45 to 50%. The  left ventricle has mildly decreased function. Left ventricular endocardial  border not optimally defined to evaluate regional wall motion. Left  ventricular diastolic parameters are  indeterminate.   2. Right ventricular systolic function  is normal. The right ventricular  size is mildly enlarged. Tricuspid regurgitation signal is inadequate for  assessing PA pressure.   3. Left atrial size was mildly dilated.   4. Right atrial size was moderately dilated.   5. The mitral valve is normal in structure. Mild to moderate mitral valve  regurgitation. No evidence of mitral stenosis.   6. The aortic valve is normal in structure. Aortic valve regurgitation is  not visualized. Mild to moderate aortic valve sclerosis/calcification is  present, without any evidence of aortic stenosis.   7. Aortic dilatation noted. There is mild dilatation of the ascending  aorta, measuring 40 mm.   8. The inferior vena cava is dilated in size with >50% respiratory  variability, suggesting right atrial pressure of 8 mmHg.   EKG:  EKG is ordered today.  The EKG ordered today demonstrates ***  Recent Labs: 10/18/2021: ALT  28; BUN 25; Creatinine, Ser 1.19; Hemoglobin 15.3; Platelets 183; Potassium 4.5; Sodium 139  Recent Lipid Panel    Component Value Date/Time   CHOL 143 10/18/2021 0807   TRIG 106.0 10/18/2021 0807   TRIG 205 06/17/2013 0000   HDL 56.90 10/18/2021 0807   CHOLHDL 3 10/18/2021 0807   VLDL 21.2 10/18/2021 0807   LDLCALC 65 10/18/2021 0807   LDLCALC 82 06/17/2013 0000    PHYSICAL EXAM:    VS:  There were no vitals taken for this visit.  BMI: There is no height or weight on file to calculate BMI.  Physical Exam  Wt Readings from Last 3 Encounters:  12/28/21 172 lb (78 kg)  11/23/21 170 lb (77.1 kg)  10/25/21 173 lb 8 oz (78.7 kg)     ASSESSMENT & PLAN:   Permanent A-fib:  HFrEF:   {Are you ordering a CV Procedure (e.g. stress test, cath, DCCV, TEE, etc)?   Press F2        :[id number]}     Disposition: F/u with Dr. Quentin Ore or an APP in ***.   Medication Adjustments/Labs and Tests Ordered: Current medicines are reviewed at length with the patient today.  Concerns regarding medicines are outlined above. Medication  changes, Labs and Tests ordered today are summarized above and listed in the Patient Instructions accessible in Encounters.   Signed, Christell Faith, PA-C 02/05/2022 10:53 AM     [facility] [address] [place] [address] [place], [place] [address] [phone]

## 2022-02-06 ENCOUNTER — Other Ambulatory Visit: Payer: Self-pay | Admitting: Family Medicine

## 2022-02-06 ENCOUNTER — Ambulatory Visit: Payer: PPO | Admitting: Physician Assistant

## 2022-02-06 DIAGNOSIS — I4821 Permanent atrial fibrillation: Secondary | ICD-10-CM

## 2022-02-06 NOTE — Telephone Encounter (Signed)
Encourage patient to contact the pharmacy for refills or they can request refills through [facility]:  4.4.23  NEXT APPOINTMENT DATE: not scheduled  MEDICATION: apixaban (ELIQUIS) 5 MG TABS tablet  Is the patient out of medication? N  Patient has one to two weeks of eliquis left, takes that long to ship to him  PHARMACY:  [facility]) - [place], [place] Phone:  [phone]  Fax:  [phone]

## 2022-02-06 NOTE — Telephone Encounter (Signed)
Prescribed by [facility].  However, per Amy, pt wants Dr. Darnell Level to prescribe rx.   Eliquis Last rx:  10/18/21, #180 Last OV:  10/25/21, AWV Next OV:  none

## 2022-02-07 MED ORDER — APIXABAN 5 MG PO TABS: 5.0000 mg | ORAL_TABLET | Freq: Two times a day (BID) | ORAL | 1 refills | Status: DC

## 2022-02-07 NOTE — Telephone Encounter (Signed)
This was previously filled by cardiology  Pt requests PCP to fill - I've sent in. Will need to discuss who fills in future.

## 2022-02-19 NOTE — Progress Notes (Unsigned)
Cardiology Office Note    Date:  02/21/2022   ID:  CAYMEN DUBRAY, DOB 07/28/1933, MRN [id number]  PCP:  Ria Bush, MD  Cardiologist:  None  Electrophysiologist:  Vickie Epley, MD   Chief Complaint: Follow up  History of Present Illness:   ATHARVA MIRSKY is a 88 y.o. male with history of permanent A-fib, HFrEF, HTN, HLD, GERD, BPH, and arthritis who presents for follow up of Afib and HFrEF.  He established care with Dr. Quentin Ore in 05/2020 for evaluation of A-fib and dizziness.  Plan was to initially anticoagulate and pursue DCCV.  He underwent briefly successful DCCV on 07/09/2020, though within 3 minutes of cardioversion he went back into A-fib.  Echo in 06/2020 showed an EF of 45 to 50%, indeterminate LV diastolic function parameters, normal RV systolic function with mildly enlarged ventricular cavity size, mildly dilated left atrium, moderately dilated right atrium, mild to moderate mitral regurgitation, mild to moderate aortic valve sclerosis without evidence of stenosis, mild dilatation of the ascending aorta measuring 40 mm, and an estimated right atrial pressure of 8 mmHg.  Metoprolol has previously been transitioned to carvedilol secondary to rash.  Medication escalation has been limited at times due to concerns for potential off target effects.  Rate control strategy for his A-fib has been pursued.  He was last seen in the office in 01/2021 and was without symptoms of angina or decompensation.  More recently, received a MyChart message on 02/02/2022 noting intermittent lightheadedness with patient noting heart rates rarely higher than the lower 70s bpm, even with exertion.  Dr. Quentin Ore subsequently recommended the patient stop carvedilol altogether and initiate metoprolol succinate 12.5 mg daily in the evening.  He comes in accompanied by his wife and is doing reasonably well from a cardiac perspective, without symptoms of angina or decompensation.  He remains active at  baseline, riding a stationary bike multiple days per week without angina or unusual dyspnea.  He continues to note he cannot get his heart rate greater than 72 bpm with exercise.  He did not transition from carvedilol to metoprolol as outlined above given prior noted rash with metoprolol.  He has intermittently tried to take carvedilol once daily, though felt worse with this.  He continues to note lightheadedness as outlined above.  His weight is down 3 pounds by our scale.  No significant lower extremity swelling or orthopnea.  No falls or symptoms concerning for bleeding.  He does inquire about taking carvedilol daily in the morning and every other day in the evening.   Labs independently reviewed: 09/2021 - TC 143, TG 106, HDL 56, LDL 65, potassium 4.5, BUN 25, serum creatinine 1.19, albumin 4.0, AST/ALT normal, Hgb 15.3, PLT 183 05/2020 - TSH normal    Past Medical History:  Diagnosis Date   Arthritis    knee   BPH (benign prostatic hypertrophy)    on avodart, released from urologist care   Colon polyps 2013   TA 2013 Henrene Pastor)   Diverticulosis    GERD (gastroesophageal reflux disease)    Hematospermia    s/p uro eval   HLD (hyperlipidemia)    mild   HTN (hypertension)    Wears hearing aid in left ear     Past Surgical History:  Procedure Laterality Date   BUNIONECTOMY Right 2014   CARDIOVERSION N/A 07/09/2020   Procedure: CARDIOVERSION;  Surgeon: Wellington Hampshire, MD;  Location: [facility];  Service: Cardiovascular;  Laterality: N/A;   CATARACT EXTRACTION W/PHACO  Right 08/14/2018   Procedure: CATARACT EXTRACTION PHACO AND INTRAOCULAR LENS PLACEMENT ([place])  RIGHT;  Surgeon: Leandrew Koyanagi, MD;  Location: [place];  Service: Ophthalmology;  Laterality: Right;  requests arrival between 830 & 1030   CATARACT EXTRACTION W/PHACO Left 09/11/2018   Procedure: CATARACT EXTRACTION PHACO AND INTRAOCULAR LENS PLACEMENT ([place])  LEFT;  Surgeon: Leandrew Koyanagi, MD;  Location:  [place];  Service: Ophthalmology;  Laterality: Left;  Doesn't want to be early   COLONOSCOPY  2008   COLONOSCOPY  01/2012   TA x1, diverticulosis, rpt 5 yrs Henrene Pastor)   COLONOSCOPY  02/2017   TA, diverticulosis, int hem no f/u needed Henrene Pastor)   CYSTECTOMY     TONSILLECTOMY  1944   UPPER GI ENDOSCOPY      Current Medications: Current Meds  Medication Sig   acetaminophen (TYLENOL) 650 MG CR tablet Take 1,300 mg by mouth at bedtime.   apixaban (ELIQUIS) 5 MG TABS tablet Take 1 tablet (5 mg total) by mouth 2 (two) times daily.   calcium carbonate (TUMS - DOSED IN MG ELEMENTAL CALCIUM) 500 MG chewable tablet Chew 1 tablet by mouth as needed for indigestion or heartburn.   carvedilol (COREG CR) 10 MG 24 hr capsule Take 1 capsule (10 mg total) by mouth daily.   carvedilol (COREG CR) 10 MG 24 hr capsule Take 1 capsule (10 mg total) by mouth daily.   Cyanocobalamin (B-12) 5000 MCG SUBL Place 5,000 mcg under the tongue as needed.   dutasteride (AVODART) 0.5 MG capsule TAKE 1 CAPSULE BY MOUTH EVERY OTHER DAY   Glucosamine-Chondroitin (OSTEO BI-FLEX REGULAR STRENGTH PO) Take 1 tablet by mouth at bedtime.   Krill Oil 500 MG CAPS Take 1 capsule by mouth daily.    magnesium gluconate (MAGONATE) 500 MG tablet Take 500 mg by mouth daily.   MISC NATURAL PRODUCTS EX Apply topically. Cannabis cream   Multiple Vitamins-Minerals (MULTIVITAMIN WITH MINERALS) tablet Take 1 tablet by mouth daily.   potassium gluconate 595 (99 K) MG TABS tablet Take 595 mg by mouth at bedtime.   telmisartan (MICARDIS) 80 MG tablet TAKE 1 TABLET BY MOUTH EVERY DAY    Allergies:   Metoprolol   Social History   Socioeconomic History   Marital status: Married    Spouse name: Not on file   Number of children: Not on file   Years of education: Not on file   Highest education level: Not on file  Occupational History   Not on file  Tobacco Use   Smoking status: Former   Smokeless tobacco: Never   Tobacco comments:     cigars back in the 60's  Vaping Use   Vaping Use: Never used  Substance and Sexual Activity   Alcohol use: Yes    Comment: occassionally-beer or wine   Drug use: No   Sexual activity: Yes    Partners: Female  Other Topics Concern   Not on file  Social History Narrative   Lives with wife   Grown children   Occupation: prior worked with Clinical cytogeneticist as Recruitment consultant; volunteers at [employer] in [place]: BS   Activity: works out at [employer] 3x/wk, on recumbent bike   Diet: some water, fruits/vegetables daily         Social Determinants of Health   Financial Resource Strain: [place]  (04/04/2021)   Overall Financial Resource Strain (CARDIA)    Difficulty of Paying Living Expenses: Not very hard  Food Insecurity:  No Food Insecurity (10/18/2021)   Hunger Vital Sign    Worried About Running Out of Food in the Last Year: Never true    Ran Out of Food in the Last Year: Never true  Transportation Needs: No Transportation Needs (10/18/2021)   PRAPARE - Hydrologist (Medical): No    Lack of Transportation (Non-Medical): No  Physical Activity: Not on file  Stress: Not on file  Social Connections: Not on file     Family History:  The patient's family history includes Heart disease (age of onset: 35) in his brother; Sudden death (age of onset: 65) in his father. There is no history of Cancer, Diabetes, Stroke, Colon cancer, Esophageal cancer, or Stomach cancer.  ROS:   12-point review of systems is negative unless otherwise noted in the HPI.   EKGs/Labs/Other Studies Reviewed:    Studies reviewed were summarized above. The additional studies were reviewed today:  2D echo 07/13/2020:  1. Left ventricular ejection fraction, by estimation, is 45 to 50%. The  left ventricle has mildly decreased function. Left ventricular endocardial  border not optimally defined to evaluate regional wall motion. Left  ventricular diastolic parameters are   indeterminate.   2. Right ventricular systolic function is normal. The right ventricular  size is mildly enlarged. Tricuspid regurgitation signal is inadequate for  assessing PA pressure.   3. Left atrial size was mildly dilated.   4. Right atrial size was moderately dilated.   5. The mitral valve is normal in structure. Mild to moderate mitral valve  regurgitation. No evidence of mitral stenosis.   6. The aortic valve is normal in structure. Aortic valve regurgitation is  not visualized. Mild to moderate aortic valve sclerosis/calcification is  present, without any evidence of aortic stenosis.   7. Aortic dilatation noted. There is mild dilatation of the ascending  aorta, measuring 40 mm.   8. The inferior vena cava is dilated in size with >50% respiratory  variability, suggesting right atrial pressure of 8 mmHg.    EKG:  EKG is ordered today.  The EKG ordered today demonstrates A-fib with slow ventricular response, 57 bpm, no acute ST-T changes  Recent Labs: 10/18/2021: ALT 28; BUN 25; Creatinine, Ser 1.19; Hemoglobin 15.3; Platelets 183; Potassium 4.5; Sodium 139  Recent Lipid Panel    Component Value Date/Time   CHOL 143 10/18/2021 0807   TRIG 106.0 10/18/2021 0807   TRIG 205 06/17/2013 0000   HDL 56.90 10/18/2021 0807   CHOLHDL 3 10/18/2021 0807   VLDL 21.2 10/18/2021 0807   LDLCALC 65 10/18/2021 0807   LDLCALC 82 06/17/2013 0000    PHYSICAL EXAM:    VS:  BP (!) 140/70 (BP Location: Left Arm, Patient Position: Sitting, Cuff Size: Normal)   Pulse (!) 57   Ht '5\' 11"'$  (1.803 m)   Wt 178 lb 4 oz (80.9 kg)   SpO2 93%   BMI 24.86 kg/m   BMI: Body mass index is 24.86 kg/m.  Physical Exam Vitals reviewed.  Constitutional:      Appearance: He is well-developed.  HENT:     Head: Normocephalic and atraumatic.  Eyes:     General:        Right eye: No discharge.        Left eye: No discharge.  Neck:     Vascular: No JVD.  Cardiovascular:     Rate and Rhythm:  Bradycardia present. Rhythm irregularly irregular.     Pulses:  Posterior tibial pulses are 2+ on the right side and 2+ on the left side.     Heart sounds: Normal heart sounds, S1 normal and S2 normal. Heart sounds not distant. No midsystolic click and no opening snap. No murmur heard.    No friction rub.  Pulmonary:     Effort: Pulmonary effort is normal. No respiratory distress.     Breath sounds: Normal breath sounds. No decreased breath sounds, wheezing or rales.  Chest:     Chest wall: No tenderness.  Abdominal:     General: There is no distension.  Musculoskeletal:     Cervical back: Normal range of motion.     Right lower leg: No edema.     Left lower leg: No edema.     Comments: Left knee brace noted.  Skin:    General: Skin is warm and dry.     Nails: There is no clubbing.  Neurological:     Mental Status: He is alert and oriented to person, place, and time.  Psychiatric:        Speech: Speech normal.        Behavior: Behavior normal.        Thought Content: Thought content normal.        Judgment: Judgment normal.     Wt Readings from Last 3 Encounters:  02/21/22 178 lb 4 oz (80.9 kg)  12/28/21 172 lb (78 kg)  11/23/21 170 lb (77.1 kg)     ASSESSMENT & PLAN:   Permanent A-fib with slow ventricular response: He remains in A-fib and is largely doing well outside of some lightheadedness that have been attributed to AV nodal blocker therapy and heart rates.  We will transition him from carvedilol to Coreg CR 10 mg to see if this improves his dizziness.  If he continues to note lightheadedness with this, we will likely need to hold beta-blockers moving forward.  CHA2DS2-VASc at least 4 (CHF, HTN, age x2).  He remains on apixaban 5 mg twice daily and does not meet reduced dosing criteria.  No symptoms concerning for bleeding.  Recent hemoglobin normal.  HFrEF: He appears euvolemic, well compensated, and is not requiring a standing diuretic.  We will transition him  from Coreg to [place] in an effort to minimize dizziness.  He will otherwise remain on telmisartan.  Update echo.  Dilated aortic root: Follow-up echo as outlined above.  HTN: Blood pressure is reasonably controlled in the office.  Continue medical therapy as outlined above.    Disposition: F/u with Dr. Quentin Ore in 6 months.   Medication Adjustments/Labs and Tests Ordered: Current medicines are reviewed at length with the patient today.  Concerns regarding medicines are outlined above. Medication changes, Labs and Tests ordered today are summarized above and listed in the Patient Instructions accessible in Encounters.   Signed, Christell Faith, PA-C 02/21/2022 10:10 AM     [facility] - [address] [place], [place] [address] [phone]

## 2022-02-21 ENCOUNTER — Ambulatory Visit: Payer: PPO | Admitting: Physician Assistant

## 2022-02-21 ENCOUNTER — Encounter: Payer: Self-pay | Admitting: Physician Assistant

## 2022-02-21 VITALS — BP 140/70 | HR 57 | Ht 71.0 in | Wt 178.2 lb

## 2022-02-21 DIAGNOSIS — I5020 Unspecified systolic (congestive) heart failure: Secondary | ICD-10-CM

## 2022-02-21 DIAGNOSIS — I77810 Thoracic aortic ectasia: Secondary | ICD-10-CM

## 2022-02-21 DIAGNOSIS — I4821 Permanent atrial fibrillation: Secondary | ICD-10-CM

## 2022-02-21 DIAGNOSIS — I10 Essential (primary) hypertension: Secondary | ICD-10-CM

## 2022-02-21 MED ORDER — CARVEDILOL PHOSPHATE ER 10 MG PO CP24
10.0000 mg | ORAL_CAPSULE | Freq: Every day | ORAL | 0 refills | Status: DC
Start: 2022-02-21 — End: 2022-03-08

## 2022-02-21 MED ORDER — CARVEDILOL PHOSPHATE ER 10 MG PO CP24: 10.0000 mg | ORAL_CAPSULE | Freq: Every day | ORAL | 3 refills | Status: DC

## 2022-02-21 NOTE — Patient Instructions (Signed)
Medication Instructions:  Your physician has recommended you make the following change in your medication:   START Coreg CR 10 mg once daily   *If you need a refill on your cardiac medications before your next appointment, please call your pharmacy*   Lab Work: None  If you have labs (blood work) drawn today and your tests are completely normal, you will receive your results only by: [place] (if you have MyChart) OR A paper copy in the mail If you have any lab test that is abnormal or we need to change your treatment, we will call you to review the results.   Testing/Procedures: Your physician has requested that you have an echocardiogram. Echocardiography is a painless test that uses sound waves to create images of your heart. It provides your doctor with information about the size and shape of your heart and how well your heart's chambers and valves are working. This procedure takes approximately one hour. There are no restrictions for this procedure.    Follow-Up: At [facility], you and your health needs are our priority.  As part of our continuing mission to provide you with exceptional heart care, we have created designated Provider Care Teams.  These Care Teams include your primary Cardiologist (physician) and Advanced Practice Providers (APPs -  Physician Assistants and Nurse Practitioners) who all work together to provide you with the care you need, when you need it.   Your next appointment:   6 month(s)  The format for your next appointment:   In Person  Provider:   Lars Mage, MD      Important Information About Sugar

## 2022-03-08 ENCOUNTER — Telehealth: Payer: Self-pay | Admitting: Cardiology

## 2022-03-08 NOTE — Telephone Encounter (Signed)
Pulled from OV:  He did not transition from carvedilol to metoprolol as outlined above given prior noted rash with metoprolol. We will transition him from carvedilol to Coreg CR 10 mg to see if this improves his dizziness.   If he continues to note lightheadedness with this, we will likely need to hold beta-blockers moving forward.  Pharmacy concerned with the jump from 3.125 mg to 10 mg.  Per MD Quentin Ore discontinue Coreg CR.  Notified pharmacy and patient.

## 2022-03-08 NOTE — Telephone Encounter (Signed)
Pt c/o medication issue:  1. Name of Medication: carvedilol (COREG CR) 10 MG 24 hr capsule  2. How are you currently taking this medication (dosage and times per day)?   3. Are you having a reaction (difficulty breathing--STAT)?   4. What is your medication issue? Pharmacy is calling to get clarification of this medication. They state that another doctor had this medication dose at 3.125 and wanted to make sure that the increase to 10 mg was correct. Please advise.

## 2022-03-13 ENCOUNTER — Ambulatory Visit: Payer: HMO | Admitting: Physician Assistant

## 2022-03-13 ENCOUNTER — Encounter: Payer: Self-pay | Admitting: Cardiology

## 2022-03-14 ENCOUNTER — Ambulatory Visit: Payer: HMO | Admitting: Physician Assistant

## 2022-03-14 DIAGNOSIS — M5136 Other intervertebral disc degeneration, lumbar region: Secondary | ICD-10-CM | POA: Diagnosis not present

## 2022-03-14 DIAGNOSIS — M545 Low back pain, unspecified: Secondary | ICD-10-CM | POA: Diagnosis not present

## 2022-03-14 DIAGNOSIS — M4126 Other idiopathic scoliosis, lumbar region: Secondary | ICD-10-CM | POA: Diagnosis not present

## 2022-03-14 DIAGNOSIS — M47816 Spondylosis without myelopathy or radiculopathy, lumbar region: Secondary | ICD-10-CM | POA: Diagnosis not present

## 2022-03-15 NOTE — Telephone Encounter (Signed)
Discussed Beta- Blocker medications, Afib and the patient's lightheadedness and dizziness.  Patient verbalized understanding of education.

## 2022-03-22 ENCOUNTER — Other Ambulatory Visit: Payer: Self-pay | Admitting: Physician Assistant

## 2022-03-23 ENCOUNTER — Ambulatory Visit: Payer: PPO | Attending: Physician Assistant

## 2022-03-23 DIAGNOSIS — I5020 Unspecified systolic (congestive) heart failure: Secondary | ICD-10-CM

## 2022-03-23 DIAGNOSIS — I4821 Permanent atrial fibrillation: Secondary | ICD-10-CM | POA: Diagnosis not present

## 2022-03-23 LAB — ECHOCARDIOGRAM COMPLETE
AR max vel: 3.92 cm2
AV Area VTI: 3.73 cm2
AV Area mean vel: 3.71 cm2
AV Mean grad: 1.3 mmHg
AV Peak grad: 2.8 mmHg
Ao pk vel: 0.83 m/s
S' Lateral: 3.7 cm
Single Plane A4C EF: 56.1 %

## 2022-03-24 ENCOUNTER — Telehealth: Payer: Self-pay

## 2022-03-24 NOTE — Telephone Encounter (Signed)
Attempted to contact pt. Line is busy. Will attempt again later.

## 2022-03-24 NOTE — Telephone Encounter (Signed)
3rd attempt to call pt, but line is still busy. He has viewed the results on MyChart.

## 2022-03-24 NOTE — Telephone Encounter (Signed)
-----   Message from Rise Mu, PA-C sent at 03/24/2022  7:55 AM EDT ----- Echo showed normal pump function, normal wall motion, mildly dilated left atrium, mildly leaky mitral valve, aortic valve thickening without evidence of narrowing, and no evidence of fluid accumulation.  When compared to echo in 2021, pulm function has improved and is now normal, mitral valve is less leaky, pressure within the upper right chamber of the heart is improved, and aorta is noted to be normal in size and structure.  Overall, reassuring echo.

## 2022-03-24 NOTE — Telephone Encounter (Signed)
Line is still busy.  Will continue to attempt to call.

## 2022-03-27 DIAGNOSIS — M47816 Spondylosis without myelopathy or radiculopathy, lumbar region: Secondary | ICD-10-CM | POA: Diagnosis not present

## 2022-03-27 DIAGNOSIS — M545 Low back pain, unspecified: Secondary | ICD-10-CM | POA: Diagnosis not present

## 2022-03-27 DIAGNOSIS — M5136 Other intervertebral disc degeneration, lumbar region: Secondary | ICD-10-CM | POA: Diagnosis not present

## 2022-04-17 ENCOUNTER — Telehealth: Payer: Self-pay

## 2022-04-17 NOTE — Progress Notes (Signed)
    Chronic Care Management Pharmacy Assistant   Name: Michael Decker  MRN: [id number] DOB: [birth year]  Reason for Encounter: CCM (Appointment Reminder)  Medications: Outpatient Encounter Medications as of 04/17/2022  Medication Sig   acetaminophen (TYLENOL) 650 MG CR tablet Take 1,300 mg by mouth at bedtime.   apixaban (ELIQUIS) 5 MG TABS tablet Take 1 tablet (5 mg total) by mouth 2 (two) times daily.   calcium carbonate (TUMS - DOSED IN MG ELEMENTAL CALCIUM) 500 MG chewable tablet Chew 1 tablet by mouth as needed for indigestion or heartburn.   Cyanocobalamin (B-12) 5000 MCG SUBL Place 5,000 mcg under the tongue as needed.   dutasteride (AVODART) 0.5 MG capsule TAKE 1 CAPSULE BY MOUTH EVERY OTHER DAY   Glucosamine-Chondroitin (OSTEO BI-FLEX REGULAR STRENGTH PO) Take 1 tablet by mouth at bedtime.   Krill Oil 500 MG CAPS Take 1 capsule by mouth daily.    magnesium gluconate (MAGONATE) 500 MG tablet Take 500 mg by mouth daily.   MISC NATURAL PRODUCTS EX Apply topically. Cannabis cream   Multiple Vitamins-Minerals (MULTIVITAMIN WITH MINERALS) tablet Take 1 tablet by mouth daily.   potassium gluconate 595 (99 K) MG TABS tablet Take 595 mg by mouth at bedtime.   telmisartan (MICARDIS) 80 MG tablet TAKE 1 TABLET BY MOUTH EVERY DAY   No facility-administered encounter medications on file as of 04/17/2022.   [name] was contacted to remind of upcoming telephone visit with Charlene Brooke on 04/20/2022 at 8:45. Patient was reminded to have any blood glucose and blood pressure readings available for review at appointment.   Message was left reminding patient of appointment.  CCM referral has been placed prior to visit?  No   Star Rating Drugs: Medication:  Last Fill: Day Supply No star rating drugs  Charlene Brooke, CPP notified  Marijean Niemann, [place] Clinical Pharmacy Assistant [phone]

## 2022-04-20 ENCOUNTER — Ambulatory Visit: Payer: PPO | Admitting: Pharmacist

## 2022-04-20 DIAGNOSIS — I10 Essential (primary) hypertension: Secondary | ICD-10-CM

## 2022-04-20 DIAGNOSIS — I4819 Other persistent atrial fibrillation: Secondary | ICD-10-CM

## 2022-04-20 NOTE — Progress Notes (Signed)
Chronic Care Management Pharmacy Note  04/20/2022 Name:  Michael Decker MRN:  [id number] DOB:  Jun 23, 1934  Summary: CCM F/U visit -Reviewed medications, pt affirms compliance as prescribed -Pt is now off beta blocker (carvedilol) due to dizziness, feels much better. He reports SBP 130-140s, HR 70s.  Recommendations/Changes made from today's visit: -No med changes  Plan: -Transition CCM to Self Care: Patient achieved CCM goals and no longer needs to be contacted as frequently. The patient has been provided with contact information for the care management team and has been advised to call with any health related questions or concerns.      Subjective: Michael Decker is an 88 y.o. year old male who is a primary patient of Ria Bush, MD.  The CCM team was consulted for assistance with disease management and care coordination needs.    Engaged with patient by telephone for follow up visit in response to provider referral for pharmacy case management and/or care coordination services.   Consent to Services:  The patient was given information about Chronic Care Management services, agreed to services, and gave verbal consent prior to initiation of services.  Please see initial visit note for detailed documentation.   Patient Care Team: Ria Bush, MD as PCP - General (Family Medicine) Vickie Epley, MD as PCP - Electrophysiology (Cardiology) Druscilla Brownie, MD as Consulting Physician (Dermatology) Raynelle Bring, MD as Consulting Physician (Urology) Troxler, Adele Schilder (Inactive) as Attending Physician (Podiatry) Leandrew Koyanagi, MD as Referring Physician (Ophthalmology) Irene Shipper, MD as Consulting Physician (Gastroenterology) Charlton Haws, [facility] as Pharmacist (Pharmacist)  Recent office visits: 01/05/22 pt message - reasonable to try once daily carvedilol (dizzy spells) 10/25/21 Dr Danise Mina OV: annual - lives @ [facility]. DNR. RTC 1  year. 02/08/21 Dr Danise Mina OV: f/u HTN. BP 124/66. No changes.  Recent consult visits: 02/21/22 PA Christell Faith (Cardiology): pt did not switch to metoprolol. Transition to carvedilol CR 10 mg - never filled.  02/04/22 Cardiology pt message - stop carvedilol. Change to metoprolol succinate 12.5 mg PM.  12/28/21 Dr Bernardo Heater (Urology): hematospermia. Benign DRE. Reassured.  11/23/21 Dr Bernardo Heater (Urology): f/u BPH. No changes. F/u PRN 10/27/21 PA Damaris Hippo (Ortho): f/u L knee OA. 06/21/21 PA Damaris Hippo (Ortho): f/u L Knee OA. 04/21/21 Dr Bernardo Heater (Urology): f/u gross hematuria. Likely 2/2 BPH. Ordred CT urogram and cystoscopy.  Hospital visits: None in previous 6 months   Objective:  Lab Results  Component Value Date   CREATININE 1.19 10/18/2021   BUN 25 (H) 10/18/2021   GFR 55.69 (L) 10/18/2021   GFRNONAA >60 07/07/2020   NA 139 10/18/2021   K 4.5 10/18/2021   CALCIUM 9.3 10/18/2021   CO2 31 10/18/2021   GLUCOSE 91 10/18/2021    Lab Results  Component Value Date/Time   GFR 55.69 (L) 10/18/2021 08:07 AM   GFR 55.68 (L) 05/25/2020 12:45 PM    Last diabetic Eye exam: No results found for: "HMDIABEYEEXA"  Last diabetic Foot exam: No results found for: "HMDIABFOOTEX"   Lab Results  Component Value Date   CHOL 143 10/18/2021   HDL 56.90 10/18/2021   LDLCALC 65 10/18/2021   TRIG 106.0 10/18/2021   CHOLHDL 3 10/18/2021       Latest Ref Rng & Units 10/18/2021    8:07 AM 05/25/2020   12:45 PM 07/07/2019    9:09 AM  Hepatic Function  Total Protein 6.0 - 8.3 g/dL 6.2  6.4  6.0   Albumin 3.5 -  5.2 g/dL 4.0  3.8  3.7   AST 0 - 37 U/L 28  24  21    ALT 0 - 53 U/L 28  19  16    Alk Phosphatase 39 - 117 U/L 60  62  63   Total Bilirubin 0.2 - 1.2 mg/dL 0.8  0.9  0.8     Lab Results  Component Value Date/Time   TSH 1.05 05/25/2020 12:45 PM   TSH 1.28 04/20/2017 10:45 AM       Latest Ref Rng & Units 10/18/2021    8:07 AM 07/07/2020    9:57 AM 05/25/2020   12:45 PM  CBC  WBC 3.8 -  10.8 Thousand/uL 5.3  7.6  6.6   Hemoglobin 13.2 - 17.1 g/dL 15.3  15.2  15.2   Hematocrit 38.5 - 50.0 % 45.2  45.1  44.5   Platelets 140 - 400 Thousand/uL 183  209  200.0     No results found for: "VD25OH"  Clinical ASCVD: No  The ASCVD Risk score (Arnett DK, et al., 2019) failed to calculate for the following reasons:   The 2019 ASCVD risk score is only valid for ages 59 to 70       10/25/2021    3:02 PM 07/14/2020    3:34 PM 07/14/2019    2:18 PM  Depression screen PHQ 2/9  Decreased Interest 0 0 0  Down, Depressed, Hopeless 0 0 0  PHQ - 2 Score 0 0 0     CHA2DS2/VAS Stroke Risk Points  Current as of a minute ago     4 >= 2 Points: High Risk  1 - 1.99 Points: Medium Risk  0 Points: Low Risk      Points Metrics  1 Has Congestive Heart Failure:  Yes    Current as of a minute ago  0 Has Vascular Disease:  No    Current as of a minute ago  1 Has Hypertension:  Yes    Current as of a minute ago  2 Age:  88    Current as of a minute ago  0 Has Diabetes:  No    Current as of a minute ago  0 Had Stroke:  No  Had TIA:  No  Had Thromboembolism:  No    Current as of a minute ago  0 Male:  No    Current as of a minute ago      Social History   Tobacco Use  Smoking Status Former  Smokeless Tobacco Never  Tobacco Comments   cigars back in the 60's   BP Readings from Last 3 Encounters:  02/21/22 (!) 140/70  12/28/21 (!) 143/61  11/23/21 (!) 147/69   Pulse Readings from Last 3 Encounters:  02/21/22 (!) 57  12/28/21 (!) 50  11/23/21 (!) 50   Wt Readings from Last 3 Encounters:  02/21/22 178 lb 4 oz (80.9 kg)  12/28/21 172 lb (78 kg)  11/23/21 170 lb (77.1 kg)   BMI Readings from Last 3 Encounters:  02/21/22 24.86 kg/m  12/28/21 24.86 kg/m  11/23/21 24.57 kg/m    Assessment/Interventions: Review of patient past medical history, allergies, medications, health status, including review of consultants reports, laboratory and other test data, was performed  as part of comprehensive evaluation and provision of chronic care management services.   SDOH:  (Social Determinants of Health) assessments and interventions performed: Yes SDOH Interventions    Flowsheet Row Chronic Care Management from 10/18/2021 in [place] at [place] Chronic Care Management from 04/04/2021 in [employer] at [place] Interventions    Food Insecurity Interventions Intervention Not Indicated --  Transportation Interventions Intervention Not Indicated --  Financial Strain Interventions -- Intervention Not Indicated      [place]: No Food Insecurity (10/18/2021)  Housing: Low Risk  (04/12/2020)  Transportation Needs: No Transportation Needs (10/18/2021)  Depression (PHQ2-9): Low Risk  (10/25/2021)  Financial Resource Strain: Low Risk  (04/04/2021)  Tobacco Use: Medium Risk (02/21/2022)    CCM Care Plan  Allergies  Allergen Reactions   Metoprolol Rash    Medications Reviewed Today     Reviewed by Sindy Messing (Physician Assistant Certified) on 00/92/33 at [phone]  Med List Status: <None>   Medication Order Taking? Sig Documenting Provider Last Dose Status Informant  acetaminophen (TYLENOL) 650 MG CR tablet [id number] Yes Take 1,300 mg by mouth at bedtime. [provider] Taking Active   apixaban (ELIQUIS) 5 MG TABS tablet [id number] Yes Take 1 tablet (5 mg total) by mouth 2 (two) times daily. Ria Bush, MD Taking Active   calcium carbonate (TUMS - DOSED IN MG ELEMENTAL CALCIUM) 500 MG chewable tablet [id number] Yes Chew 1 tablet by mouth as needed for indigestion or heartburn. [provider] Taking Active Self  Cyanocobalamin (B-12) 5000 MCG SUBL [id number] Yes Place 5,000 mcg under the tongue as needed. [provider] Taking Active Self  dutasteride (AVODART) 0.5 MG capsule [id number] Yes TAKE 1 CAPSULE BY MOUTH EVERY OTHER DAY Ria Bush, MD Taking Active    Glucosamine-Chondroitin (OSTEO BI-FLEX REGULAR STRENGTH PO) [id number] Yes Take 1 tablet by mouth at bedtime. [provider] Taking Active Self  Krill Oil 500 MG CAPS [id number] Yes Take 1 capsule by mouth daily.  [provider] Taking Active Self  magnesium gluconate (MAGONATE) 500 MG tablet [id number] Yes Take 500 mg by mouth daily. [provider] Taking Active Self  MISC NATURAL PRODUCTS EX [id number] Yes Apply topically. Cannabis cream [provider] Taking Active Self  Multiple Vitamins-Minerals (MULTIVITAMIN WITH MINERALS) tablet [id number] Yes Take 1 tablet by mouth daily. [provider] Taking Active Self  potassium gluconate 595 (99 K) MG TABS tablet [id number] Yes Take 595 mg by mouth at bedtime. [provider] Taking Active Self  telmisartan (MICARDIS) 80 MG tablet [id number] Yes TAKE 1 TABLET BY MOUTH EVERY DAY Ria Bush, MD Taking Active             Patient Active Problem List   Diagnosis Date Noted   DNR (do not resuscitate) 48/27/0786   Chronic systolic heart failure ([place] 02/09/2021   Laceration of right index finger 01/25/2021   Penile rash 01/19/2021   Forearm mass, left 10/29/2020   Dizziness 05/25/2020   Persistent atrial fibrillation ([place]) 05/25/2020   Iron excess 06/28/2017   Peripheral neuropathy 06/08/2017   Osteoarthritis of left knee 04/16/2017   Health maintenance examination 07/06/2016   Leg cramping 07/06/2016   Low back pain 02/23/2015   Medicare annual wellness visit, subsequent 06/23/2014   Advanced care planning/counseling discussion 06/23/2014   Rhinorrhea 06/23/2014   HTN (hypertension)    HLD (hyperlipidemia)    GERD (gastroesophageal reflux disease)    Benign prostatic hyperplasia    Colon polyps     Immunization History  Administered Date(s) Administered   Fluad Quad(high Dose 65+) 04/08/2019   Influenza Inj Mdck Quad Pf 05/02/2018   Influenza, High Dose Seasonal PF  05/04/2017, 05/14/2020  Influenza,inj,Quad PF,6+ Mos 04/29/2014, 05/14/2015, 05/02/2016   Influenza-Unspecified 05/09/2013, 05/05/2021   Moderna Sars-Covid-2 Vaccination 09/09/2019, 10/07/2019, 05/22/2020   Pneumococcal Conjugate-13 06/23/2014   Pneumococcal Polysaccharide-23 03/24/2001, 06/28/2016   Tdap 05/11/2009, 01/22/2021   Zoster, Live 06/12/2012    Conditions to be addressed/monitored:  Hypertension and Atrial Fibrillation  There are no care plans that you recently modified to display for this patient.   Medication Assistance: None required.  Patient affirms current coverage meets needs.  Compliance/Adherence/Medication fill history: Care Gaps: NONE  Star-Rating Drugs: Telmisartan - PDC 100%  Medication Access: Within the past 30 days, how often has patient missed a dose of medication? 0 Is a pillbox or other method used to improve adherence? No  Factors that may affect medication adherence? no barriers identified Are meds synced by current pharmacy? No  Are meds delivered by current pharmacy? Yes  Does patient experience delays in picking up medications due to transportation concerns? No   [facility] Services Reviewed: Is patient disadvantaged to use [facility]?: Yes  Current Rx insurance plan: [place] Name and location of Current pharmacy:  [facility] [id number] [place]Phone: [phone]: [phone] [address] [place]Phone: [phone]: [phone] [facility] services reviewed with patient today?: No  Patient requests to transfer care to [facility]?: No  Reason patient declined to change pharmacies: Disadvantaged due to insurance/mail order   Care Plan and Follow Up Patient Decision:  Patient agrees to Care Plan and Follow-up.  Plan: The patient has  been provided with contact information for the care management team and has been advised to call with any health related questions or concerns.   LCharlene Brooke PharmD, BCACP Clinical Pharmacist [phone]

## 2022-04-20 NOTE — Patient Instructions (Addendum)
Visit Information  Phone number for Pharmacist: [phone]   Goals Addressed   None     Care Plan : [place]  Updates made by Charlton Haws, [place] since 04/20/2022 12:00 AM     Problem: Hypertension and Atrial Fibrillation   Priority: High     Long-Range Goal: Disease mgmt   Start Date: 10/18/2021  Expected End Date: 04/20/2022  This Visit's Progress: On track  Recent Progress: On track  Priority: High  Note:   Current Barriers:  None identified  Pharmacist Clinical Goal(s):  Patient will contact provider office for questions/concerns as evidenced notation of same in electronic health record through collaboration with PharmD and provider.   Interventions: 1:1 collaboration with Ria Bush, MD regarding development and update of comprehensive plan of care as evidenced by provider attestation and co-signature Inter-disciplinary care team collaboration (see longitudinal plan of care) Comprehensive medication review performed; medication list updated in electronic medical record  Hypertension (BP goal <140/90) -Controlled - BP mostly at goal at home; he reports dizziness improved when beta blocker was stopped earlier this month -Last ejection fraction: 45-50% (Date: 06/2020) -HF type: Systolic; NYHA Class: I (no actitivty limitation) -Current home BP readings: HR 70s SBP 130s-140s -Current treatment: Telmisartan 80 mg daily - Appropriate, Effective, Safe, Accessible -Medications previously tried: diltiazem, metoprolol (rash), losartan, carvedilol (dizziness) -Educated on BP goals and benefits of medications for prevention of heart attack, stroke and kidney damage -Counseled to monitor BP at home daily -Recommended to continue current medication  Atrial Fibrillation (Goal: prevent stroke and major bleeding) -Controlled - now off beta blocker due to dizziness -CHADSVASC: 4 -Current treatment: Eliquis 5 mg BID - Appropriate, Effective, Safe,  Accessible -Medications previously tried: n/a -Counseled on increased risk of stroke due to Afib and benefits of anticoagulation for stroke prevention; -Recommended to continue current medication  Patient Goals/Self-Care Activities Patient will:  - take medications as prescribed as evidenced by patient report and record review focus on medication adherence by routine check blood pressure daily, document, and provide at future appointments       Patient verbalizes understanding of instructions and care plan provided today and agrees to view in [place]. Active MyChart status and patient understanding of how to access instructions and care plan via MyChart confirmed with patient.    The patient has been provided with contact information for the care management team and has been advised to call with any health related questions or concerns.    Charlene Brooke, PharmD, BCACP Clinical Pharmacist [place] [facility] [phone]

## 2022-04-25 DIAGNOSIS — M461 Sacroiliitis, not elsewhere classified: Secondary | ICD-10-CM | POA: Diagnosis not present

## 2022-04-25 DIAGNOSIS — M5136 Other intervertebral disc degeneration, lumbar region: Secondary | ICD-10-CM | POA: Diagnosis not present

## 2022-04-26 ENCOUNTER — Encounter: Payer: Self-pay | Admitting: Cardiology

## 2022-04-26 ENCOUNTER — Encounter: Payer: Self-pay | Admitting: Family Medicine

## 2022-05-03 ENCOUNTER — Other Ambulatory Visit: Payer: Self-pay | Admitting: Family Medicine

## 2022-05-09 ENCOUNTER — Ambulatory Visit (INDEPENDENT_AMBULATORY_CARE_PROVIDER_SITE_OTHER): Payer: PPO

## 2022-05-09 DIAGNOSIS — Z23 Encounter for immunization: Secondary | ICD-10-CM | POA: Diagnosis not present

## 2022-05-17 DIAGNOSIS — M1712 Unilateral primary osteoarthritis, left knee: Secondary | ICD-10-CM | POA: Diagnosis not present

## 2022-05-18 DIAGNOSIS — M461 Sacroiliitis, not elsewhere classified: Secondary | ICD-10-CM | POA: Diagnosis not present

## 2022-05-21 ENCOUNTER — Other Ambulatory Visit: Payer: Self-pay | Admitting: Physician Assistant

## 2022-05-24 DIAGNOSIS — M1712 Unilateral primary osteoarthritis, left knee: Secondary | ICD-10-CM | POA: Diagnosis not present

## 2022-05-31 DIAGNOSIS — M1712 Unilateral primary osteoarthritis, left knee: Secondary | ICD-10-CM | POA: Diagnosis not present

## 2022-06-09 DIAGNOSIS — M461 Sacroiliitis, not elsewhere classified: Secondary | ICD-10-CM | POA: Diagnosis not present

## 2022-06-09 DIAGNOSIS — M47816 Spondylosis without myelopathy or radiculopathy, lumbar region: Secondary | ICD-10-CM | POA: Diagnosis not present

## 2022-06-09 DIAGNOSIS — M5136 Other intervertebral disc degeneration, lumbar region: Secondary | ICD-10-CM | POA: Diagnosis not present

## 2022-06-27 DIAGNOSIS — H47321 Drusen of optic disc, right eye: Secondary | ICD-10-CM | POA: Diagnosis not present

## 2022-06-28 ENCOUNTER — Telehealth: Payer: Self-pay

## 2022-06-28 DIAGNOSIS — I4821 Permanent atrial fibrillation: Secondary | ICD-10-CM

## 2022-06-28 MED ORDER — APIXABAN 5 MG PO TABS: 5.0000 mg | ORAL_TABLET | Freq: Two times a day (BID) | ORAL | 1 refills | Status: DC

## 2022-06-28 NOTE — Telephone Encounter (Signed)
ERx plz notify pt sent to mail order.

## 2022-06-28 NOTE — Telephone Encounter (Signed)
Eliquis Last rx:  02/07/22, #180 Last OV:  10/25/21, AWV Next OV:  none

## 2022-06-28 NOTE — Addendum Note (Signed)
Addended by: Brenton Grills on: 93/0/1237 99:09 PM   Modules accepted: Orders

## 2022-06-28 NOTE — Addendum Note (Signed)
Addended by: Ria Bush on: 06/28/2022 05:45 PM   Modules accepted: Orders

## 2022-06-29 NOTE — Telephone Encounter (Signed)
Spoke with pt notifying him rx was sent to [facility] order pharmacy. Pt expresses his thanks.

## 2022-07-05 ENCOUNTER — Encounter: Payer: Self-pay | Admitting: Student

## 2022-07-05 ENCOUNTER — Ambulatory Visit: Payer: PPO | Admitting: Student

## 2022-07-05 VITALS — BP 134/82 | HR 60 | Temp 97.4°F | Ht 71.0 in | Wt 178.0 lb

## 2022-07-05 DIAGNOSIS — S61211A Laceration without foreign body of left index finger without damage to nail, initial encounter: Secondary | ICD-10-CM | POA: Diagnosis not present

## 2022-07-05 NOTE — Progress Notes (Signed)
[facility]  Provider: Unk Lightning   Goals of Care:     01/22/2021    4:55 PM  Advanced Directives  Does Patient Have a Medical Advance Directive? No     Chief Complaint  Patient presents with   Acute Visit    Cut Left Point Finger Saturday with Hedge[employer]imm[employer]. Putting Neosporin and keeping it clean.     HPI: Patient is a 88 y.o. male seen today for an acute visit for  He stuck his finger on an electric hedge cutter.Happened on 07/01/22 It was quick. Bled a lot initially because he is on blood thinners (eliquis).   He has been cleaning with peroxide and putting neosporin on it.   Last tetanus was 2 years ago.   Past Medical History:  Diagnosis Date   Arthritis    knee   BPH (benign prostatic hypertrophy)    on avodart, released from urologist care   Colon polyps 2013   TA 2013 Henrene Pastor)   Diverticulosis    GERD (gastroesophageal reflux disease)    Hematospermia    s/p uro eval   HLD (hyperlipidemia)    mild   HTN (hypertension)    Wears hearing aid in left ear     Past Surgical History:  Procedure Laterality Date   BUNIONECTOMY Right 2014   CARDIOVERSION N/A 07/09/2020   Procedure: CARDIOVERSION;  Surgeon: Wellington Hampshire, MD;  Location: [facility];  Service: Cardiovascular;  Laterality: N/A;   CATARACT EXTRACTION W/PHACO Right 08/14/2018   Procedure: CATARACT EXTRACTION PHACO AND INTRAOCULAR LENS PLACEMENT ([place])  RIGHT;  Surgeon: Leandrew Koyanagi, MD;  Location: [place];  Service: Ophthalmology;  Laterality: Right;  requests arrival between 830 & 1030   CATARACT EXTRACTION W/PHACO Left 09/11/2018   Procedure: CATARACT EXTRACTION PHACO AND INTRAOCULAR LENS PLACEMENT ([place])  LEFT;  Surgeon: Leandrew Koyanagi, MD;  Location: [place];  Service: Ophthalmology;  Laterality: Left;  Doesn't want to be early   COLONOSCOPY  2008   COLONOSCOPY  01/2012   TA x1, diverticulosis, rpt 5 yrs Henrene Pastor)   COLONOSCOPY  02/2017   TA, diverticulosis, int hem no  f/u needed Henrene Pastor)   CYSTECTOMY     TONSILLECTOMY  1944   UPPER GI ENDOSCOPY      Allergies  Allergen Reactions   Metoprolol Rash    Outpatient Encounter Medications as of 07/05/2022  Medication Sig   acetaminophen (TYLENOL) 650 MG CR tablet Take 1,300 mg by mouth at bedtime.   apixaban (ELIQUIS) 5 MG TABS tablet Take 1 tablet (5 mg total) by mouth 2 (two) times daily.   calcium carbonate (TUMS - DOSED IN MG ELEMENTAL CALCIUM) 500 MG chewable tablet Chew 1 tablet by mouth as needed for indigestion or heartburn.   Cyanocobalamin (B-12) 5000 MCG SUBL Place 5,000 mcg under the tongue as needed.   dutasteride (AVODART) 0.5 MG capsule TAKE 1 CAPSULE BY MOUTH EVERY OTHER DAY   Glucosamine-Chondroitin (OSTEO BI-FLEX REGULAR STRENGTH PO) Take 1 tablet by mouth at bedtime.   Krill Oil 500 MG CAPS Take 1 capsule by mouth daily.    magnesium gluconate (MAGONATE) 500 MG tablet Take 500 mg by mouth daily.   MISC NATURAL PRODUCTS EX Apply topically. Cannabis cream   Multiple Vitamins-Minerals (MULTIVITAMIN WITH MINERALS) tablet Take 1 tablet by mouth daily.   telmisartan (MICARDIS) 80 MG tablet TAKE 1 TABLET BY MOUTH EVERY DAY   No facility-administered encounter medications on file as of 07/05/2022.    Review of Systems:  Review of Systems  All other systems reviewed and are negative.   Health Maintenance  Topic Date Due   Zoster Vaccines- Shingrix (1 of 2) Never done   COLONOSCOPY (Pts 45-83yr Insurance coverage will need to be confirmed)  03/13/2022   COVID-19 Vaccine (4 - 2023-24 season) 03/24/2022   Medicare Annual Wellness (AWV)  10/26/2022   DTaP/Tdap/Td (3 - Td or Tdap) 01/23/2031   Pneumonia Vaccine 88 Years old  Completed   INFLUENZA VACCINE  Completed   HPV VACCINES  Aged Out    Physical Exam: Vitals:   07/05/22 1518  BP: 134/82  Pulse: 60  Temp: (!) 97.4 F (36.3 C)  SpO2: 98%  Weight: 178 lb (80.7 kg)  Height: '5\' 11"'$  (1.803 m)   Body mass index is 24.83  kg/m. Physical Exam Constitutional:      Appearance: Normal appearance.  Skin:    Comments: Left index finger with 1cm v-shaped wound. No surrounding erythema, serous, or purulent drainage. Additional 456mpuncture wound of the index finger, hemostatic. Some surrounding edema. Cap refill less than 2 seconds. Sensation intact.   Neurological:     Mental Status: He is alert.     Labs reviewed: Basic Metabolic Panel: Recent Labs    10/18/21 0807  NA 139  K 4.5  CL 104  CO2 31  GLUCOSE 91  BUN 25*  CREATININE 1.19  CALCIUM 9.3   Liver Function Tests: Recent Labs    10/18/21 0807  AST 28  ALT 28  ALKPHOS 60  BILITOT 0.8  PROT 6.2  ALBUMIN 4.0   No results for input(s): "LIPASE", "AMYLASE" in the last 8760 hours. No results for input(s): "AMMONIA" in the last 8760 hours. CBC: Recent Labs    10/18/21 0807  WBC 5.3  NEUTROABS 2,920  HGB 15.3  HCT 45.2  MCV 100.9*  PLT 183   Lipid Panel: Recent Labs    10/18/21 0807  CHOL 143  HDL 56.90  LDLCALC 65  TRIG 106.0  CHOLHDL 3   No results found for: "HGBA1C"  Procedures since last visit: No results found.  Assessment/Plan 1. Laceration of left index finger without damage to nail, foreign body presence unspecified, initial encounter Patient with injury >24 hours ago. No signs of infection at this time. Discussed concerning features of infection such as erythema or purulent drainage. No signs of abscess at this time. Due to puncture wound, unable to see base of injury, however, neurovascularly intact. Will continue to monitor. Encouraged cleansing with soap and water, Vaseline, and covering with bandage. Will follow up if he has signs of infection. If develops signs of infection, will consider imaging for further evaluation. UTD on tetanus vaccine.    Labs/tests ordered:  * No order type specified * Next appt:  Visit date not found

## 2022-07-24 ENCOUNTER — Encounter: Payer: Self-pay | Admitting: Family Medicine

## 2022-07-24 DIAGNOSIS — R361 Hematospermia: Secondary | ICD-10-CM

## 2022-07-28 DIAGNOSIS — R361 Hematospermia: Secondary | ICD-10-CM | POA: Insufficient documentation

## 2022-07-31 ENCOUNTER — Other Ambulatory Visit: Payer: Self-pay | Admitting: Family Medicine

## 2022-07-31 DIAGNOSIS — I10 Essential (primary) hypertension: Secondary | ICD-10-CM

## 2022-07-31 NOTE — Telephone Encounter (Signed)
LVM for patient to call back and schedule

## 2022-07-31 NOTE — Telephone Encounter (Signed)
E-scribed refill.  Plz schedule CPE and lab visits to prevent delays in future refills.  

## 2022-08-23 DIAGNOSIS — M216X2 Other acquired deformities of left foot: Secondary | ICD-10-CM | POA: Diagnosis not present

## 2022-08-23 DIAGNOSIS — M792 Neuralgia and neuritis, unspecified: Secondary | ICD-10-CM | POA: Diagnosis not present

## 2022-08-23 DIAGNOSIS — M545 Low back pain, unspecified: Secondary | ICD-10-CM | POA: Diagnosis not present

## 2022-08-23 DIAGNOSIS — M2142 Flat foot [pes planus] (acquired), left foot: Secondary | ICD-10-CM | POA: Diagnosis not present

## 2022-08-23 DIAGNOSIS — M2011 Hallux valgus (acquired), right foot: Secondary | ICD-10-CM | POA: Diagnosis not present

## 2022-08-23 DIAGNOSIS — G8929 Other chronic pain: Secondary | ICD-10-CM | POA: Diagnosis not present

## 2022-08-23 DIAGNOSIS — M2141 Flat foot [pes planus] (acquired), right foot: Secondary | ICD-10-CM | POA: Diagnosis not present

## 2022-08-23 DIAGNOSIS — M205X1 Other deformities of toe(s) (acquired), right foot: Secondary | ICD-10-CM | POA: Diagnosis not present

## 2022-08-23 DIAGNOSIS — M79674 Pain in right toe(s): Secondary | ICD-10-CM | POA: Diagnosis not present

## 2022-08-23 DIAGNOSIS — M216X1 Other acquired deformities of right foot: Secondary | ICD-10-CM | POA: Diagnosis not present

## 2022-09-27 ENCOUNTER — Ambulatory Visit: Payer: PPO | Admitting: Student

## 2022-09-27 ENCOUNTER — Encounter: Payer: Self-pay | Admitting: Student

## 2022-09-27 DIAGNOSIS — L600 Ingrowing nail: Secondary | ICD-10-CM

## 2022-09-27 NOTE — Progress Notes (Signed)
Location:  [facility]   Place of Service:   [facility]  Provider: Unk Lightning  Code Status: DNR Goals of Care:     01/22/2021    4:55 PM  Advanced Directives  Does Patient Have a Medical Advance Directive? No     Chief Complaint  Patient presents with   Acute Visit    Bleeding in the right foot on the big toe between the cubicle     HPI: Patient is a 89 y.o. male seen today for an acute visit for evaluation of ingrown toenail. He has had bleeding for the last week. Started having irritation and has been using a toe wedge to help with relief of pressure. No pus drainage. Some tenderness.   Past Medical History:  Diagnosis Date   Arthritis    knee   BPH (benign prostatic hypertrophy)    on avodart, released from urologist care   Colon polyps 2013   TA 2013 Henrene Pastor)   Diverticulosis    GERD (gastroesophageal reflux disease)    Hematospermia    s/p uro eval   HLD (hyperlipidemia)    mild   HTN (hypertension)    Wears hearing aid in left ear     Past Surgical History:  Procedure Laterality Date   BUNIONECTOMY Right 2014   CARDIOVERSION N/A 07/09/2020   Procedure: CARDIOVERSION;  Surgeon: Wellington Hampshire, MD;  Location: [facility];  Service: Cardiovascular;  Laterality: N/A;   CATARACT EXTRACTION W/PHACO Right 08/14/2018   Procedure: CATARACT EXTRACTION PHACO AND INTRAOCULAR LENS PLACEMENT ([place])  RIGHT;  Surgeon: Leandrew Koyanagi, MD;  Location: [place];  Service: Ophthalmology;  Laterality: Right;  requests arrival between 830 & 1030   CATARACT EXTRACTION W/PHACO Left 09/11/2018   Procedure: CATARACT EXTRACTION PHACO AND INTRAOCULAR LENS PLACEMENT ([place])  LEFT;  Surgeon: Leandrew Koyanagi, MD;  Location: [place];  Service: Ophthalmology;  Laterality: Left;  Doesn't want to be early   COLONOSCOPY  2008   COLONOSCOPY  01/2012   TA x1, diverticulosis, rpt 5 yrs Henrene Pastor)   COLONOSCOPY  02/2017   TA, diverticulosis, int hem no f/u needed Henrene Pastor)    CYSTECTOMY     TONSILLECTOMY  1944   UPPER GI ENDOSCOPY      Allergies  Allergen Reactions   Metoprolol Rash    Outpatient Encounter Medications as of 09/27/2022  Medication Sig   acetaminophen (TYLENOL) 650 MG CR tablet Take 1,300 mg by mouth at bedtime.   apixaban (ELIQUIS) 5 MG TABS tablet Take 1 tablet (5 mg total) by mouth 2 (two) times daily.   calcium carbonate (TUMS - DOSED IN MG ELEMENTAL CALCIUM) 500 MG chewable tablet Chew 1 tablet by mouth as needed for indigestion or heartburn.   Cyanocobalamin (B-12) 5000 MCG SUBL Place 5,000 mcg under the tongue as needed.   dutasteride (AVODART) 0.5 MG capsule TAKE 1 CAPSULE BY MOUTH EVERY OTHER DAY   Glucosamine-Chondroitin (OSTEO BI-FLEX REGULAR STRENGTH PO) Take 1 tablet by mouth at bedtime.   Krill Oil 500 MG CAPS Take 1 capsule by mouth daily.    magnesium gluconate (MAGONATE) 500 MG tablet Take 500 mg by mouth daily.   MISC NATURAL PRODUCTS EX Apply topically. Cannabis cream   Multiple Vitamins-Minerals (MULTIVITAMIN WITH MINERALS) tablet Take 1 tablet by mouth daily.   telmisartan (MICARDIS) 80 MG tablet TAKE 1 TABLET BY MOUTH EVERY DAY   No facility-administered encounter medications on file as of 09/27/2022.    Review of Systems:  Review of Systems  Health Maintenance  Topic Date Due   Zoster Vaccines- Shingrix (1 of 2) Never done   COLONOSCOPY (Pts 45-24yr Insurance coverage will need to be confirmed)  03/13/2022   COVID-19 Vaccine (4 - 2023-24 season) 03/24/2022   Medicare Annual Wellness (AWV)  10/26/2022   DTaP/Tdap/Td (3 - Td or Tdap) 01/23/2031   Pneumonia Vaccine 89 Years old  Completed   INFLUENZA VACCINE  Completed   HPV VACCINES  Aged Out    Physical Exam: There were no vitals filed for this visit. There is no height or weight on file to calculate BMI. Physical Exam Constitutional:      Appearance: Normal appearance.  Cardiovascular:     Pulses: Normal pulses.  Skin:    Comments: Right great  toenail medial erythema, induration, and scant blood.   Neurological:     Mental Status: He is alert and oriented to person, place, and time.     Labs reviewed: Basic Metabolic Panel: Recent Labs    10/18/21 0807  NA 139  K 4.5  CL 104  CO2 31  GLUCOSE 91  BUN 25*  CREATININE 1.19  CALCIUM 9.3   Liver Function Tests: Recent Labs    10/18/21 0807  AST 28  ALT 28  ALKPHOS 60  BILITOT 0.8  PROT 6.2  ALBUMIN 4.0   No results for input(s): "LIPASE", "AMYLASE" in the last 8760 hours. No results for input(s): "AMMONIA" in the last 8760 hours. CBC: Recent Labs    10/18/21 0807  WBC 5.3  NEUTROABS 2,920  HGB 15.3  HCT 45.2  MCV 100.9*  PLT 183   Lipid Panel: Recent Labs    10/18/21 0807  CHOL 143  HDL 56.90  LDLCALC 65  TRIG 106.0  CHOLHDL 3   No results found for: "HGBA1C"  Procedures since last visit: No results found.  Assessment/Plan Ingrown right big toenail Cleaned toenail. No pus at this time. Discussed conservative management. Epsom salt baths BID for 7 days. Return precuations provided in AVS. Recommend Podiatry follow up in clinic for evaluation of partial toenail removal.   Labs/tests ordered:  * No order type specified * Next appt:  Visit date not found

## 2022-09-27 NOTE — Patient Instructions (Addendum)
Consider doing soaks with warm water and scent-free epsom salt twice a day for the next 7 days.   You can elevate your feet in the evenings  Also, consider getting a soft cushion toe separator like we discussed.

## 2022-10-07 ENCOUNTER — Other Ambulatory Visit: Payer: Self-pay | Admitting: Family Medicine

## 2022-10-07 DIAGNOSIS — I10 Essential (primary) hypertension: Secondary | ICD-10-CM

## 2022-10-13 ENCOUNTER — Encounter: Payer: Self-pay | Admitting: Family Medicine

## 2022-10-13 ENCOUNTER — Ambulatory Visit (INDEPENDENT_AMBULATORY_CARE_PROVIDER_SITE_OTHER): Payer: PPO | Admitting: Family Medicine

## 2022-10-13 VITALS — BP 132/70 | HR 71 | Temp 97.4°F | Ht 71.0 in | Wt 177.5 lb

## 2022-10-13 DIAGNOSIS — L600 Ingrowing nail: Secondary | ICD-10-CM | POA: Diagnosis not present

## 2022-10-13 MED ORDER — CEPHALEXIN 500 MG PO CAPS: 500.0000 mg | ORAL_CAPSULE | Freq: Three times a day (TID) | ORAL | 0 refills | Status: DC

## 2022-10-13 NOTE — Progress Notes (Signed)
Patient ID: Michael Decker, male    DOB: Apr 11, 1934, 89 y.o.   MRN: [id number]  This visit was conducted in person.  BP 132/70   Pulse 71   Temp (!) 97.4 F (36.3 C) (Temporal)   Ht 5\' 11"  (1.803 m)   Wt 177 lb 8 oz (80.5 kg)   SpO2 95%   BMI 24.76 kg/m    CC:  Chief Complaint  Patient presents with   Ingrown Toenail    Subjective:   HPI: Michael Decker is a 89 y.o. male patient of Dr. Darnell Level  with HTN presenting on 10/13/2022 for Ingrown Toenail  Hx of neuropathic pain. Reviewed OV note from [facility] 1/31  09/27/2022  [place] [facility]  Dx ingrown right big toenail... told to go to Podiatry.   Noted 3 weeks ago... has been treating with vaseline, neosporin,warm water epsom oaks three times daily.   Area is painful off and on.  Now waking him up at night.  Some bleeding, discharge, brownish yellow off and on. No odor.  Some spread of redness down toe.   Has hammer toes... great toe rubs on 2nd digit.Marland Kitchen using toe wedge.  No fever.        Relevant past medical, surgical, family and social history reviewed and updated as indicated. Interim medical history since our last visit reviewed. Allergies and medications reviewed and updated. Outpatient Medications Prior to Visit  Medication Sig Dispense Refill   acetaminophen (TYLENOL) 650 MG CR tablet Take 1,300 mg by mouth at bedtime.     apixaban (ELIQUIS) 5 MG TABS tablet Take 1 tablet (5 mg total) by mouth 2 (two) times daily. 180 tablet 1   calcium carbonate (TUMS - DOSED IN MG ELEMENTAL CALCIUM) 500 MG chewable tablet Chew 1 tablet by mouth as needed for indigestion or heartburn.     Cyanocobalamin (B-12) 5000 MCG SUBL Place 5,000 mcg under the tongue as needed.     dutasteride (AVODART) 0.5 MG capsule TAKE 1 CAPSULE BY MOUTH EVERY OTHER DAY 45 capsule 3   Glucosamine-Chondroitin (OSTEO BI-FLEX REGULAR STRENGTH PO) Take 1 tablet by mouth at bedtime.     Krill Oil 500 MG CAPS Take 1 capsule by mouth  daily.      magnesium gluconate (MAGONATE) 500 MG tablet Take 500 mg by mouth daily.     MISC NATURAL PRODUCTS EX Apply topically. Cannabis cream     Multiple Vitamins-Minerals (MULTIVITAMIN WITH MINERALS) tablet Take 1 tablet by mouth daily.     telmisartan (MICARDIS) 80 MG tablet TAKE 1 TABLET BY MOUTH EVERY DAY 90 tablet 0   No facility-administered medications prior to visit.     Per HPI unless specifically indicated in ROS section below Review of Systems  Constitutional:  Negative for fatigue and fever.  HENT:  Negative for ear pain.   Eyes:  Negative for pain.  Respiratory:  Negative for cough and shortness of breath.   Cardiovascular:  Negative for chest pain, palpitations and leg swelling.  Gastrointestinal:  Negative for abdominal pain.  Genitourinary:  Negative for dysuria.  Musculoskeletal:  Negative for arthralgias.  Neurological:  Negative for syncope, light-headedness and headaches.  Psychiatric/Behavioral:  Negative for dysphoric mood.    Objective:  BP 132/70   Pulse 71   Temp (!) 97.4 F (36.3 C) (Temporal)   Ht 5\' 11"  (1.803 m)   Wt 177 lb 8 oz (80.5 kg)   SpO2 95%   BMI  24.76 kg/m   Wt Readings from Last 3 Encounters:  10/13/22 177 lb 8 oz (80.5 kg)  07/05/22 178 lb (80.7 kg)  02/21/22 178 lb 4 oz (80.9 kg)      Physical Exam Constitutional:      Appearance: He is well-developed.  HENT:     Head: Normocephalic.     Right Ear: Hearing normal.     Left Ear: Hearing normal.     Nose: Nose normal.  Neck:     Thyroid: No thyroid mass or thyromegaly.     Vascular: No carotid bruit.     Trachea: Trachea normal.  Cardiovascular:     Rate and Rhythm: Normal rate and regular rhythm.     Pulses: Normal pulses.     Heart sounds: Heart sounds not distant. No murmur heard.    No friction rub. No gallop.     Comments: No peripheral edema Pulmonary:     Effort: Pulmonary effort is normal. No respiratory distress.     Breath sounds: Normal breath sounds.   Skin:    General: Skin is warm and dry.     Findings: No rash.  Psychiatric:        Speech: Speech normal.        Behavior: Behavior normal.        Thought Content: Thought content normal.          Results for orders placed or performed in visit on 03/23/22  ECHOCARDIOGRAM COMPLETE  Result Value Ref Range   AR max vel 3.92 cm2   AV Peak grad 2.8 mmHg   Ao pk vel 0.83 m/s   S' Lateral 3.70 cm   AV Area VTI 3.73 cm2   AV Mean grad 1.3 mmHg   Single Plane A4C EF 56.1 %   AV Area mean vel 3.71 cm2    Assessment and Plan  Ingrown right greater toenail Assessment & Plan: Acute on chronic issue. Now with possible bacterial superinfection given extension of redness and change in discharge.  No area of fluctuance indicating need for incision and drainage.  Recommended continued warm water soaks and will treat with a course of antibiotics.  Discussed more definitive treatment with follow-up with podiatry for removal removal of the nail edge versus if he returns to a tolerable range she can redirect nail with cotton gradually over time.  Return and ER precautions provided.   Other orders -     Cephalexin; Take 1 capsule (500 mg total) by mouth 3 (three) times daily.  Dispense: 21 capsule; Refill: 0    No follow-ups on file.   Eliezer Lofts, MD

## 2022-10-13 NOTE — Assessment & Plan Note (Signed)
Acute on chronic issue. Now with possible bacterial superinfection given extension of redness and change in discharge.  No area of fluctuance indicating need for incision and drainage.  Recommended continued warm water soaks and will treat with a course of antibiotics.  Discussed more definitive treatment with follow-up with podiatry for removal removal of the nail edge versus if he returns to a tolerable range she can redirect nail with cotton gradually over time.  Return and ER precautions provided.

## 2022-10-13 NOTE — Patient Instructions (Addendum)
Follow up with [facility] of elsewhere.   Call if referral needed.  Start and complete antibiotics.  Continue warm water soaks 2 times daily.

## 2022-10-21 ENCOUNTER — Other Ambulatory Visit: Payer: Self-pay | Admitting: Family Medicine

## 2022-10-21 DIAGNOSIS — E785 Hyperlipidemia, unspecified: Secondary | ICD-10-CM

## 2022-10-21 DIAGNOSIS — I4819 Other persistent atrial fibrillation: Secondary | ICD-10-CM

## 2022-10-21 DIAGNOSIS — E538 Deficiency of other specified B group vitamins: Secondary | ICD-10-CM | POA: Insufficient documentation

## 2022-10-21 DIAGNOSIS — N401 Enlarged prostate with lower urinary tract symptoms: Secondary | ICD-10-CM

## 2022-10-23 NOTE — Telephone Encounter (Signed)
LAST APPOINTMENT DATE: 10/13/2022   NEXT APPOINTMENT DATE: 10/24/2022    LAST REFILL: 11/03/21  QTY: #45 w/ 3 refills

## 2022-10-24 ENCOUNTER — Other Ambulatory Visit (INDEPENDENT_AMBULATORY_CARE_PROVIDER_SITE_OTHER): Payer: PPO

## 2022-10-24 DIAGNOSIS — N401 Enlarged prostate with lower urinary tract symptoms: Secondary | ICD-10-CM | POA: Diagnosis not present

## 2022-10-24 DIAGNOSIS — E785 Hyperlipidemia, unspecified: Secondary | ICD-10-CM | POA: Diagnosis not present

## 2022-10-24 DIAGNOSIS — R351 Nocturia: Secondary | ICD-10-CM | POA: Diagnosis not present

## 2022-10-24 DIAGNOSIS — I4819 Other persistent atrial fibrillation: Secondary | ICD-10-CM | POA: Diagnosis not present

## 2022-10-24 DIAGNOSIS — E538 Deficiency of other specified B group vitamins: Secondary | ICD-10-CM | POA: Diagnosis not present

## 2022-10-24 LAB — COMPREHENSIVE METABOLIC PANEL
ALT: 20 U/L (ref 0–53)
AST: 27 U/L (ref 0–37)
Albumin: 3.8 g/dL (ref 3.5–5.2)
Alkaline Phosphatase: 58 U/L (ref 39–117)
BUN: 28 mg/dL — ABNORMAL HIGH (ref 6–23)
CO2: 30 mEq/L (ref 19–32)
Calcium: 8.9 mg/dL (ref 8.4–10.5)
Chloride: 103 mEq/L (ref 96–112)
Creatinine, Ser: 1.36 mg/dL (ref 0.40–1.50)
GFR: 47.11 mL/min — ABNORMAL LOW (ref >60.00)
Glucose, Bld: 113 mg/dL — ABNORMAL HIGH (ref 70–99)
Potassium: 4.2 mEq/L (ref 3.5–5.1)
Sodium: 137 mEq/L (ref 135–145)
Total Bilirubin: 0.8 mg/dL (ref 0.2–1.2)
Total Protein: 6.1 g/dL (ref 6.0–8.3)

## 2022-10-24 LAB — CBC WITH DIFFERENTIAL/PLATELET
Basophils Absolute: 0 10*3/uL (ref 0.0–0.1)
Basophils Relative: 0.7 % (ref 0.0–3.0)
Eosinophils Absolute: 0.2 10*3/uL (ref 0.0–0.7)
Eosinophils Relative: 3.7 % (ref 0.0–5.0)
HCT: 44.1 % (ref 39.0–52.0)
Hemoglobin: 14.9 g/dL (ref 13.0–17.0)
Lymphocytes Relative: 33.3 % (ref 12.0–46.0)
Lymphs Abs: 1.6 10*3/uL (ref 0.7–4.0)
MCHC: 33.9 g/dL (ref 30.0–36.0)
MCV: 101.7 fl — ABNORMAL HIGH (ref 78.0–100.0)
Monocytes Absolute: 0.4 10*3/uL (ref 0.1–1.0)
Monocytes Relative: 8.6 % (ref 3.0–12.0)
Neutro Abs: 2.6 10*3/uL (ref 1.4–7.7)
Neutrophils Relative %: 53.7 % (ref 43.0–77.0)
Platelets: 177 10*3/uL (ref 150.0–400.0)
RBC: 4.34 Mil/uL (ref 4.22–5.81)
RDW: 12.7 % (ref 11.5–15.5)
WBC: 4.9 10*3/uL (ref 4.0–10.5)

## 2022-10-24 LAB — LIPID PANEL
Cholesterol: 144 mg/dL (ref 0–200)
HDL: 57.7 mg/dL (ref >39.00)
LDL Cholesterol: 69 mg/dL (ref 0–99)
NonHDL: 85.91
Total CHOL/HDL Ratio: 2
Triglycerides: 84 mg/dL (ref 0.0–149.0)
VLDL: 16.8 mg/dL (ref 0.0–40.0)

## 2022-10-24 LAB — PSA: PSA: 0.43 ng/mL (ref 0.10–4.00)

## 2022-10-24 LAB — VITAMIN B12: Vitamin B-12: 399 pg/mL (ref 211–911)

## 2022-10-26 ENCOUNTER — Encounter: Payer: Self-pay | Admitting: Podiatry

## 2022-10-26 ENCOUNTER — Ambulatory Visit (INDEPENDENT_AMBULATORY_CARE_PROVIDER_SITE_OTHER): Payer: PPO | Admitting: Podiatry

## 2022-10-26 DIAGNOSIS — L03031 Cellulitis of right toe: Secondary | ICD-10-CM | POA: Diagnosis not present

## 2022-10-27 NOTE — Progress Notes (Signed)
Subjective:  Patient ID: TUG REUM, male    DOB: 02-27-1934,  MRN: [id number] HPI Chief Complaint  Patient presents with   Toe Pain    Hallux right - lateral border, got red and swollen, started to drain late January 2024, saw PCP-Rx'd cephalexin-got better, but still looks a little red and periodically drains, wanted it checked, but not really sore   New Patient (Initial Visit)    Est pt - seen by Dr. Al Corpus several years ago in [place]    89 y.o. male presents with the above complaint.   ROS: Denies fever chills nausea vomit muscle aches pains calf pain back pain chest pain shortness of breath.  Past Medical History:  Diagnosis Date   Arthritis    knee   BPH (benign prostatic hypertrophy)    on avodart, released from urologist care   Colon polyps 2013   TA 2013 Marina Goodell)   Diverticulosis    GERD (gastroesophageal reflux disease)    Hematospermia    s/p uro eval   HLD (hyperlipidemia)    mild   HTN (hypertension)    Wears hearing aid in left ear    Past Surgical History:  Procedure Laterality Date   BUNIONECTOMY Right 2014   CARDIOVERSION N/A 07/09/2020   Procedure: CARDIOVERSION;  Surgeon: Iran Ouch, MD;  Location: [facility];  Service: Cardiovascular;  Laterality: N/A;   CATARACT EXTRACTION W/PHACO Right 08/14/2018   Procedure: CATARACT EXTRACTION PHACO AND INTRAOCULAR LENS PLACEMENT ([facility])  RIGHT;  Surgeon: Lockie Mola, MD;  Location: [facility];  Service: Ophthalmology;  Laterality: Right;  requests arrival between 830 & 1030   CATARACT EXTRACTION W/PHACO Left 09/11/2018   Procedure: CATARACT EXTRACTION PHACO AND INTRAOCULAR LENS PLACEMENT ([facility])  LEFT;  Surgeon: Lockie Mola, MD;  Location: [facility];  Service: Ophthalmology;  Laterality: Left;  Doesn't want to be early   COLONOSCOPY  2008   COLONOSCOPY  01/2012   TA x1, diverticulosis, rpt 5 yrs Marina Goodell)   COLONOSCOPY  02/2017   TA, diverticulosis, int hem no f/u needed  Marina Goodell)   CYSTECTOMY     TONSILLECTOMY  1944   UPPER GI ENDOSCOPY      Current Outpatient Medications:    acetaminophen (TYLENOL) 650 MG CR tablet, Take 1,300 mg by mouth at bedtime., Disp: , Rfl:    apixaban (ELIQUIS) 5 MG TABS tablet, Take 1 tablet (5 mg total) by mouth 2 (two) times daily., Disp: 180 tablet, Rfl: 1   calcium carbonate (TUMS - DOSED IN MG ELEMENTAL CALCIUM) 500 MG chewable tablet, Chew 1 tablet by mouth as needed for indigestion or heartburn., Disp: , Rfl:    dutasteride (AVODART) 0.5 MG capsule, TAKE 1 CAPSULE BY MOUTH EVERY OTHER DAY, Disp: 45 capsule, Rfl: 3   Glucosamine-Chondroitin (OSTEO BI-FLEX REGULAR STRENGTH PO), Take 1 tablet by mouth at bedtime., Disp: , Rfl:    Krill Oil 500 MG CAPS, Take 1 capsule by mouth daily. , Disp: , Rfl:    magnesium gluconate (MAGONATE) 500 MG tablet, Take 500 mg by mouth daily., Disp: , Rfl:    MISC NATURAL PRODUCTS EX, Apply topically. Cannabis cream, Disp: , Rfl:    Multiple Vitamins-Minerals (MULTIVITAMIN WITH MINERALS) tablet, Take 1 tablet by mouth daily., Disp: , Rfl:    telmisartan (MICARDIS) 80 MG tablet, TAKE 1 TABLET BY MOUTH EVERY DAY, Disp: 90 tablet, Rfl: 0  Allergies  Allergen Reactions   Metoprolol Rash   Review of Systems Objective:  There were no  vitals filed for this visit.  General: Well developed, nourished, in no acute distress, alert and oriented x3   Dermatological: Skin is warm, dry and supple bilateral. Nails x 10 are well maintained; remaining integument appears unremarkable at this time. There are no open sores, no preulcerative lesions, no rash or signs of infection present.  Hallux right demonstrates the hallux interphalangeal which is resulting in the juxtaposition of the first and second toes resulting in painful area of hyperkeratosis and nail impingement.  No cellulitis drainage or odor.  Vascular: Dorsalis Pedis artery and Posterior Tibial artery pedal pulses are 2/4 bilateral with immedate  capillary fill time. Pedal hair growth present. No varicosities and no lower extremity edema present bilateral.   Neruologic: Grossly intact via light touch bilateral. Vibratory intact via tuning fork bilateral. Protective threshold with Semmes Wienstein monofilament intact to all pedal sites bilateral. Patellar and Achilles deep tendon reflexes 2+ bilateral. No Babinski or clonus noted bilateral.   Musculoskeletal: No gross boney pedal deformities bilateral. No pain, crepitus, or limitation noted with foot and ankle range of motion bilateral. Muscular strength 5/5 in all groups tested bilateral.  Gait: Unassisted, Nonantalgic.    Radiographs:  None taken  Assessment & Plan:   Assessment: Mild paronychia fibular border hallux right secondary to hammertoe deformity and hallux interphalangeal.  Plan: Debridement of the nail today debridement of all reactive hyperkeratotic tissue also placed in a silicone sleeve he will follow-up with us on a as needed basis.     Embrie Mikkelsen T. East SpartaHyatt, North DakotaDPM

## 2022-10-30 DIAGNOSIS — I4821 Permanent atrial fibrillation: Secondary | ICD-10-CM | POA: Insufficient documentation

## 2022-10-30 NOTE — Progress Notes (Unsigned)
Cardiology Office Note Date:  10/31/2022  Patient ID:  Michael Decker, DOB Mar 02, 1934, MRN [id number] PCP:  Eustaquio BoydenGutierrez, Javier, MD  Cardiologist:  None Electrophysiologist: Lanier PrudeAMERON T LAMBERT, MD   Chief Complaint: 6mon follow-up perm afib  History of Present Illness: Michael AnnDavid C Soja is a 89 y.o. male with PMH notable for perm Afib, HTN, HFrecEF; seen today for Lanier PrudeAMERON T LAMBERT, MD for routine electrophysiology followup.  Last saw Dr. Lalla BrothersLambert 01/2021, doing very well staying active. Rec to switch from coreg to metop, but patient did not d/t history of rash with metop in past Saw PA R. Dunn 02/2022, was having intermittent lightheadedness. HR no higher than 70s even with exercise. Rec updated echo and switched from BID coreg to coreg CR.   Since that time, the patient began feeling more lightheaded, and states he called clinic and was told to stop coreg. He has been off coreg for months. He is much happier with his HR variability. He works out 3/week at gym on exercise bike and HR will now climb to mid-80s with exercise. He does not get dizzy when working out. No increased palpitations with exercise.   He continue to have some lightheaded/dizziness with position changes, resolves with sitting down. Not better, not worse. No syncope or pre-syncope Has BP log with most systolic readings in 110s-130s, rarely 140.  He occasionally feels palpitations, twinges in chest, but they do not last longer than 1 minute. No other concerning symptoms like CP or SOB with palpitations.   Diligently takes eliquis BID, no bleeding concerns.     he denies chest pain, dyspnea, PND, orthopnea, nausea, vomiting, syncope, edema, weight gain, or early satiety.     Past Medical History:  Diagnosis Date   Arthritis    knee   BPH (benign prostatic hypertrophy)    on avodart, released from urologist care   Colon polyps 2013   TA 2013 Marina Goodell([place])   Diverticulosis    GERD (gastroesophageal reflux disease)     Hematospermia    s/p uro eval   HLD (hyperlipidemia)    mild   HTN (hypertension)    Wears hearing aid in left ear     Past Surgical History:  Procedure Laterality Date   BUNIONECTOMY Right 2014   CARDIOVERSION N/A 07/09/2020   Procedure: CARDIOVERSION;  Surgeon: Iran OuchArida, Muhammad A, MD;  Location: [facility];  Service: Cardiovascular;  Laterality: N/A;   CATARACT EXTRACTION W/PHACO Right 08/14/2018   Procedure: CATARACT EXTRACTION PHACO AND INTRAOCULAR LENS PLACEMENT (IOC)  RIGHT;  Surgeon: Lockie MolaBrasington, Chadwick, MD;  [facility];  Service: Ophthalmology;  Laterality: Right;  requests arrival between 830 & 1030   CATARACT EXTRACTION W/PHACO Left 09/11/2018   Procedure: CATARACT EXTRACTION PHACO AND INTRAOCULAR LENS PLACEMENT (IOC)  LEFT;  Surgeon: Lockie MolaBrasington, Chadwick, MD;  [facility];  Service: Ophthalmology;  Laterality: Left;  Doesn't want to be early   COLONOSCOPY  2008   COLONOSCOPY  01/2012   TA x1, diverticulosis, rpt 5 yrs Marina Goodell([place])   COLONOSCOPY  02/2017   TA, diverticulosis, int hem no f/u needed Marina Goodell([place])   CYSTECTOMY     TONSILLECTOMY  1944   UPPER GI ENDOSCOPY      Current Outpatient Medications  Medication Instructions   acetaminophen (TYLENOL) 1,300 mg, Oral, Nightly   apixaban (ELIQUIS) 5 mg, Oral, 2 times daily   calcium carbonate (TUMS - DOSED IN MG ELEMENTAL CALCIUM) 500 MG chewable tablet 1 tablet, Oral, As needed   dutasteride (AVODART)  0.5 MG capsule TAKE 1 CAPSULE BY MOUTH EVERY OTHER DAY   Glucosamine-Chondroitin (OSTEO BI-FLEX REGULAR STRENGTH PO) 1 tablet, Oral, Daily at bedtime   Krill Oil 500 MG CAPS 1 capsule, Oral, Daily   magnesium gluconate (MAGONATE) 500 mg, Oral, Daily   MISC NATURAL PRODUCTS EX Apply externally, Cannabis cream   Multiple Vitamins-Minerals (MULTIVITAMIN WITH MINERALS) tablet 1 tablet, Daily   telmisartan (MICARDIS) 40 mg, Oral, Daily    Social History:  The patient  reports that he has quit smoking.  He has never used smokeless tobacco. He reports current alcohol use. He reports that he does not use drugs.   Family History:  The patient's family history includes Heart disease (age of onset: 45) in his brother; Sudden death (age of onset: 67) in his father.  ROS:  Please see the history of present illness. All other systems are reviewed and otherwise negative.   PHYSICAL EXAM:  VS:  BP 126/66 (BP Location: Left Arm, Patient Position: Sitting, Cuff Size: Normal)   Pulse (!) 57   Ht 5\' 11"  (1.803 m)   Wt 180 lb (81.6 kg)   SpO2 99%   BMI 25.10 kg/m  BMI: Body mass index is 25.1 kg/m.  Orthostatic VS for the past 24 hrs (Last 3 readings):  BP- Lying Pulse- Lying BP- Sitting Pulse- Sitting BP- Standing at 0 minutes Pulse- Standing at 0 minutes BP- Standing at 3 minutes Pulse- Standing at 3 minutes  10/31/22 1200 135/76 82 107/58 51 121/73 76 142/83 (!) 48     GEN- The patient is well appearing, alert and oriented x 3 today.   Lungs- Clear to ausculation bilaterally, normal work of breathing.   Heart- Irregularly irregular, bradycardic rate and rhythm, no murmurs, rubs or gallops,  Extremities- No peripheral edema, warm, dry   EKG is ordered. Personal review of EKG from today shows:  Afib, rate 57bpm  Recent Labs: 10/24/2022: ALT 20; BUN 28; Creatinine, Ser 1.36; Hemoglobin 14.9; Platelets 177.0; Potassium 4.2; Sodium 137  10/24/2022: Cholesterol 144; HDL 57.70; LDL Cholesterol 69; Total CHOL/HDL Ratio 2; Triglycerides 84.0; VLDL 16.8   Estimated Creatinine Clearance: 41.5 mL/min (by C-G formula based on SCr of 1.36 mg/dL).   Wt Readings from Last 3 Encounters:  10/31/22 180 lb (81.6 kg)  10/13/22 177 lb 8 oz (80.5 kg)  07/05/22 178 lb (80.7 kg)     Additional studies reviewed include: Previous EP, cardiology notes.   TTE 03/23/2022  1. Left ventricular ejection fraction, by estimation, is 60 to 65%. The left ventricle has normal function. The left ventricle has no regional  wall motion abnormalities. Left ventricular diastolic parameters are indeterminate.   2. Right ventricular systolic function is normal. The right ventricular size is normal. Tricuspid regurgitation signal is inadequate for assessing PA pressure.   3. Left atrial size was mildly dilated.   4. The mitral valve is normal in structure. Mild mitral valve regurgitation. No evidence of mitral stenosis.   5. The aortic valve is normal in structure. Aortic valve regurgitation is not visualized. Aortic valve sclerosis is present, with no evidence of aortic valve stenosis.   6. The inferior vena cava is normal in size with greater than 50% respiratory variability, suggesting right atrial pressure of 3 mmHg.   TTE 07/13/2020  1. Left ventricular ejection fraction, by estimation, is 45 to 50%. The left ventricle has mildly decreased function. Left ventricular endocardial border not optimally defined to evaluate regional wall motion. Left ventricular diastolic parameters are  indeterminate.   2. Right ventricular systolic function is normal. The right ventricular size is mildly enlarged. Tricuspid regurgitation signal is inadequate for assessing PA pressure.   3. Left atrial size was mildly dilated.   4. Right atrial size was moderately dilated.   5. The mitral valve is normal in structure. Mild to moderate mitral valve regurgitation. No evidence of mitral stenosis.   6. The aortic valve is normal in structure. Aortic valve regurgitation is not visualized. Mild to moderate aortic valve sclerosis/calcification is present, without any evidence of aortic stenosis.   7. Aortic dilatation noted. There is mild dilatation of the ascending aorta, measuring 40 mm.   8. The inferior vena cava is dilated in size with >50% respiratory variability, suggesting right atrial pressure of 8 mmHg.    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   The patient does not have concerns regarding his medicines.  The following changes were made today:   DECREASE telmisartan 40mg  daily  Labs/ tests ordered today include:  Orders Placed This Encounter  Procedures   EKG 12-Lead     Disposition: Follow up with Dr. Lalla Brothers or EP APP in in 12 months   Signed, Sherie Don, NP  10/31/22  12:28 PM  Electrophysiology [facility]

## 2022-10-31 ENCOUNTER — Encounter: Payer: Self-pay | Admitting: Cardiology

## 2022-10-31 ENCOUNTER — Ambulatory Visit: Payer: PPO | Attending: Cardiology | Admitting: Cardiology

## 2022-10-31 VITALS — BP 126/66 | HR 57 | Ht 71.0 in | Wt 180.0 lb

## 2022-10-31 DIAGNOSIS — I10 Essential (primary) hypertension: Secondary | ICD-10-CM | POA: Diagnosis not present

## 2022-10-31 DIAGNOSIS — I5022 Chronic systolic (congestive) heart failure: Secondary | ICD-10-CM

## 2022-10-31 DIAGNOSIS — I4821 Permanent atrial fibrillation: Secondary | ICD-10-CM

## 2022-10-31 DIAGNOSIS — I951 Orthostatic hypotension: Secondary | ICD-10-CM

## 2022-10-31 MED ORDER — TELMISARTAN 40 MG PO TABS: 40.0000 mg | ORAL_TABLET | Freq: Every day | ORAL | 2 refills | Status: DC

## 2022-10-31 NOTE — Patient Instructions (Addendum)
Medication Instructions:  DECREASE telmisartan to 40 mg by mouth once daily  *If you need a refill on your cardiac medications before your next appointment, please call your pharmacy*  Lab Work: No labs ordered  If you have labs (blood work) drawn today and your tests are completely normal, you will receive your results only by: MyChart Message (if you have MyChart) OR A paper copy in the mail If you have any lab test that is abnormal or we need to change your treatment, we will call you to review the results.  Testing/Procedures: No testing ordered  Follow-Up: At [facility], you and your health needs are our priority.  As part of our continuing mission to provide you with exceptional heart care, we have created designated Provider Care Teams.  These Care Teams include your primary Cardiologist (physician) and Advanced Practice Providers (APPs -  Physician Assistants and Nurse Practitioners) who all work together to provide you with the care you need, when you need it.  We recommend signing up for the patient portal called "MyChart".  Sign up information is provided on this After Visit Summary.  MyChart is used to connect with patients for Virtual Visits (Telemedicine).  Patients are able to view lab/test results, encounter notes, upcoming appointments, etc.  Non-urgent messages can be sent to your provider as well.   To learn more about what you can do with MyChart, go to [email or web address].    Your next appointment:   1 year  Provider:   Steffanie Dunn, MD or Sherie Don, NP

## 2022-11-01 ENCOUNTER — Encounter: Payer: Self-pay | Admitting: Family Medicine

## 2022-11-01 ENCOUNTER — Ambulatory Visit (INDEPENDENT_AMBULATORY_CARE_PROVIDER_SITE_OTHER): Payer: PPO | Admitting: Family Medicine

## 2022-11-01 VITALS — BP 165/76 | HR 50 | Temp 97.4°F | Ht 69.79 in | Wt 177.2 lb

## 2022-11-01 DIAGNOSIS — Z66 Do not resuscitate: Secondary | ICD-10-CM

## 2022-11-01 DIAGNOSIS — E7800 Pure hypercholesterolemia, unspecified: Secondary | ICD-10-CM

## 2022-11-01 DIAGNOSIS — Z0001 Encounter for general adult medical examination with abnormal findings: Secondary | ICD-10-CM

## 2022-11-01 DIAGNOSIS — E538 Deficiency of other specified B group vitamins: Secondary | ICD-10-CM

## 2022-11-01 DIAGNOSIS — N401 Enlarged prostate with lower urinary tract symptoms: Secondary | ICD-10-CM

## 2022-11-01 DIAGNOSIS — I4821 Permanent atrial fibrillation: Secondary | ICD-10-CM

## 2022-11-01 DIAGNOSIS — Z7189 Other specified counseling: Secondary | ICD-10-CM

## 2022-11-01 DIAGNOSIS — Z0000 Encounter for general adult medical examination without abnormal findings: Secondary | ICD-10-CM

## 2022-11-01 DIAGNOSIS — I700 Atherosclerosis of aorta: Secondary | ICD-10-CM

## 2022-11-01 DIAGNOSIS — N1831 Chronic kidney disease, stage 3a: Secondary | ICD-10-CM

## 2022-11-01 DIAGNOSIS — I10 Essential (primary) hypertension: Secondary | ICD-10-CM

## 2022-11-01 DIAGNOSIS — D3502 Benign neoplasm of left adrenal gland: Secondary | ICD-10-CM

## 2022-11-01 DIAGNOSIS — R361 Hematospermia: Secondary | ICD-10-CM

## 2022-11-01 MED ORDER — APIXABAN 5 MG PO TABS: 5.0000 mg | ORAL_TABLET | Freq: Two times a day (BID) | ORAL | 4 refills | Status: DC

## 2022-11-01 NOTE — Assessment & Plan Note (Addendum)
Chronic, mild, stable period off medication besides OTC krill oil. The ASCVD Risk score (Arnett DK, et al., 2019) failed to calculate for the following reasons:   The 2019 ASCVD risk score is only valid for ages 32 to 36

## 2022-11-01 NOTE — Assessment & Plan Note (Signed)

## 2022-11-01 NOTE — Assessment & Plan Note (Signed)
Progression in GFR from 60s to 40s over the past 4 years, 55-->49 in the past year.  Encouraged good hydration status, avoiding NSAIDs.  Reviewed reassuring kidney imaging from CT urogram 2022.  RTC 6 mo CKD and HTN f/u.

## 2022-11-01 NOTE — Assessment & Plan Note (Addendum)
Last confirmed 09/2021 DNR goldenrod form at home.

## 2022-11-01 NOTE — Assessment & Plan Note (Signed)
Continues daily MVI.

## 2022-11-01 NOTE — Assessment & Plan Note (Addendum)
Chronic, stable on avodart QOD.  Declines medication changes at this time

## 2022-11-01 NOTE — Assessment & Plan Note (Addendum)
Incidental finding on CT urogram 2022 - 1.7cm L adrenal adenoma.  Consider DHEAS and aldosterone/renin

## 2022-11-01 NOTE — Progress Notes (Addendum)
Ph: [phone]       Fax: [phone]   Patient ID: Michael Decker, male    DOB: [birth year], 89 y.o.   MRN: [id number]  This visit was conducted in person.  BP (!) 165/76 (BP Location: Right Arm, Cuff Size: Normal)   Pulse (!) 50   Temp (!) 97.4 F (36.3 C) (Temporal)   Ht 5' 9.79" (1.773 m)   Wt 177 lb 4 oz (80.4 kg)   SpO2 97%   BMI 25.59 kg/m   BP Readings from Last 3 Encounters:  11/01/22 (!) 165/76  10/31/22 126/66  10/13/22 132/70   With home cuff: 166/84  No data found.    CC: AMW/CPE Subjective:   HPI: Michael Decker is a 89 y.o. male presenting on 11/01/2022 for Medicare Wellness (Pt brought in home BP monitor to compare. Reading in office today- 155/89. )   Did not see health advisor.  Hearing Screening - Comments:: Pt wears B hearing aids. Wearing at today's OV.  Vision Screening - Comments:: Last eye exam, 06/2022.  Flowsheet Row Office Visit from 11/01/2022 in [facility] [place]  PHQ-2 Total Score 0          11/01/2022    4:46 PM 09/27/2022    2:41 PM 10/25/2021    3:01 PM 07/14/2020    3:33 PM 07/14/2019    2:17 PM  Fall Risk   Falls in the past year? 0 0 0 1 0  Number falls in past yr:  0  1   Injury with Fall?  0  1   Risk for fall due to :  No Fall Risks     Follow up  Falls evaluation completed      Atrial fibrillation with hypertension - continues eliquis 5mg  bid, now off carvedilol due to bradycardia. Saw cardiology yesterday, rec permissive hypertension due to orthostasis. Telmisartan was decreased to 40mg  daily. Brings BP log showing BP ranging from 124-149/70-80s, HR 50-80s   Saw urology - benign hematospermia workup.  Progressive kidney disease - CT hematuria workup 05/2021 - normal kidneys.  Incidentally noted L 1.7cm adrenal adenoma.   Preventative: COLONOSCOPY 02/2017 TA, diverticulosis, int hem no f/u needed Marina Goodell) Prostate - previously saw Dr Laverle Patter, most recently [facility] - h/o BPH. On avodart QOD.  Nocturia 4x, weak stream. notes polyuria and nocturia.  Lung cancer screen - not eligible Flu shot yearly COVID vaccine Moderna 08/2019, 09/2019, 04/2020 has continued getting boosters Pneumovax 2002 and 2017, prevnar-13 2015 Tdap 2010, 01/2021 zostavax 2013 Shingrix - discussed, declines  RSV - declined Advanced planning - HCPOA and advanced directive scanned in chart 06/2015. Wife Pat then Wyvonna Plum then Earley Favor are [facility].  Seat belt use discussed  Sunscreen use discussed. No changing moles on skin.  Ex smoker (cigars)  Alcohol - rarely  Dentist yearly  Eye exam yearly  Bowel - no constipation  Bladder - no incontinence    Lives with wife at [facility]  Occupation: prior worked with Horticulturist, commercial as Public affairs consultant; volunteers at [employer] in [place], Edu: BS  Activity: limited gym activity ([employer]) due to pandemic  Diet: some water, fruits/vegetables daily     Relevant past medical, surgical, family and social history reviewed and updated as indicated. Interim medical history since our last visit reviewed. Allergies and medications reviewed and updated. Outpatient Medications Prior to Visit  Medication Sig Dispense Refill   acetaminophen (TYLENOL) 650 MG CR tablet Take 1,300  mg by mouth at bedtime.     calcium carbonate (TUMS - DOSED IN MG ELEMENTAL CALCIUM) 500 MG chewable tablet Chew 1 tablet by mouth as needed for indigestion or heartburn.     Coenzyme Q10 (COQ10) 200 MG CAPS Take 1 capsule by mouth daily at 10 pm.     dutasteride (AVODART) 0.5 MG capsule TAKE 1 CAPSULE BY MOUTH EVERY OTHER DAY 45 capsule 3   Glucosamine-Chondroitin (OSTEO BI-FLEX REGULAR STRENGTH PO) Take 1 tablet by mouth at bedtime.     Krill Oil 500 MG CAPS Take 1 capsule by mouth daily.      magnesium gluconate (MAGONATE) 500 MG tablet Take 500 mg by mouth daily.     MISC NATURAL PRODUCTS EX Apply topically. Cannabis cream     Multiple Vitamins-Minerals (MULTIVITAMIN WITH MINERALS)  tablet Take 1 tablet by mouth daily.     telmisartan (MICARDIS) 40 MG tablet Take 1 tablet (40 mg total) by mouth daily. 90 tablet 2   apixaban (ELIQUIS) 5 MG TABS tablet Take 1 tablet (5 mg total) by mouth 2 (two) times daily. 180 tablet 1   No facility-administered medications prior to visit.     Per HPI unless specifically indicated in ROS section below Review of Systems  Constitutional:  Negative for activity change, appetite change, chills, fatigue, fever and unexpected weight change.  HENT:  Negative for hearing loss.   Eyes:  Negative for visual disturbance.  Respiratory:  Negative for cough, chest tightness, shortness of breath and wheezing.   Cardiovascular:  Negative for chest pain, palpitations and leg swelling.  Gastrointestinal:  Negative for abdominal distention, abdominal pain, blood in stool, constipation, diarrhea, nausea and vomiting.  Genitourinary:  Negative for difficulty urinating and hematuria.  Musculoskeletal:  Negative for arthralgias, myalgias and neck pain.  Skin:  Negative for rash.  Neurological:  Negative for dizziness, seizures, syncope and headaches.  Hematological:  Negative for adenopathy. Does not bruise/bleed easily.  Psychiatric/Behavioral:  Negative for dysphoric mood. The patient is not nervous/anxious.     Objective:  BP (!) 165/76 (BP Location: Right Arm, Cuff Size: Normal)   Pulse (!) 50   Temp (!) 97.4 F (36.3 C) (Temporal)   Ht 5' 9.79" (1.773 m)   Wt 177 lb 4 oz (80.4 kg)   SpO2 97%   BMI 25.59 kg/m   Wt Readings from Last 3 Encounters:  11/01/22 177 lb 4 oz (80.4 kg)  10/31/22 180 lb (81.6 kg)  10/13/22 177 lb 8 oz (80.5 kg)      Physical Exam Vitals and nursing note reviewed.  Constitutional:      General: He is not in acute distress.    Appearance: Normal appearance. He is well-developed. He is not ill-appearing.  HENT:     Head: Normocephalic and atraumatic.     Right Ear: Hearing, tympanic membrane, ear canal and  external ear normal.     Left Ear: Hearing, tympanic membrane, ear canal and external ear normal.     Mouth/Throat:     Mouth: Mucous membranes are moist.     Pharynx: Oropharynx is clear. No oropharyngeal exudate or posterior oropharyngeal erythema.  Eyes:     General: No scleral icterus.    Extraocular Movements: Extraocular movements intact.     Conjunctiva/sclera: Conjunctivae normal.     Pupils: Pupils are equal, round, and reactive to light.  Neck:     Thyroid: No thyroid mass or thyromegaly.     Vascular: No carotid bruit.  Cardiovascular:  Rate and Rhythm: Normal rate and regular rhythm.     Pulses: Normal pulses.          Radial pulses are 2+ on the right side and 2+ on the left side.     Heart sounds: Normal heart sounds. No murmur heard. Pulmonary:     Effort: Pulmonary effort is normal. No respiratory distress.     Breath sounds: Normal breath sounds. No wheezing, rhonchi or rales.  Abdominal:     General: Bowel sounds are normal. There is no distension.     Palpations: Abdomen is soft. There is no mass.     Tenderness: There is no abdominal tenderness. There is no guarding or rebound.     Hernia: No hernia is present.  Musculoskeletal:        General: Normal range of motion.     Cervical back: Normal range of motion and neck supple.     Right lower leg: No edema.     Left lower leg: No edema.  Lymphadenopathy:     Cervical: No cervical adenopathy.  Skin:    General: Skin is warm and dry.     Findings: No rash.  Neurological:     General: No focal deficit present.     Mental Status: He is alert and oriented to person, place, and time.     Comments:  Recall 3/3 Calculation 3/5 DLORW  Psychiatric:        Mood and Affect: Mood normal.        Behavior: Behavior normal.        Thought Content: Thought content normal.        Judgment: Judgment normal.       Results for orders placed or performed in visit on 10/24/22  PSA  Result Value Ref Range   PSA 0.43  0.10 - 4.00 ng/mL  Vitamin B12  Result Value Ref Range   Vitamin B-12 399 211 - 911 pg/mL  CBC with Differential/Platelet  Result Value Ref Range   WBC 4.9 4.0 - 10.5 K/uL   RBC 4.34 4.22 - 5.81 Mil/uL   Hemoglobin 14.9 13.0 - 17.0 g/dL   HCT 96.044.1 45.439.0 - 09.852.0 %   MCV 101.7 (H) 78.0 - 100.0 fl   MCHC 33.9 30.0 - 36.0 g/dL   RDW 11.912.7 14.711.5 - 82.915.5 %   Platelets 177.0 150.0 - 400.0 K/uL   Neutrophils Relative % 53.7 43.0 - 77.0 %   Lymphocytes Relative 33.3 12.0 - 46.0 %   Monocytes Relative 8.6 3.0 - 12.0 %   Eosinophils Relative 3.7 0.0 - 5.0 %   Basophils Relative 0.7 0.0 - 3.0 %   Neutro Abs 2.6 1.4 - 7.7 K/uL   Lymphs Abs 1.6 0.7 - 4.0 K/uL   Monocytes Absolute 0.4 0.1 - 1.0 K/uL   Eosinophils Absolute 0.2 0.0 - 0.7 K/uL   Basophils Absolute 0.0 0.0 - 0.1 K/uL  Comprehensive metabolic panel  Result Value Ref Range   Sodium 137 135 - 145 mEq/L   Potassium 4.2 3.5 - 5.1 mEq/L   Chloride 103 96 - 112 mEq/L   CO2 30 19 - 32 mEq/L   Glucose, Bld 113 (H) 70 - 99 mg/dL   BUN 28 (H) 6 - 23 mg/dL   Creatinine, Ser 5.621.36 0.40 - 1.50 mg/dL   Total Bilirubin 0.8 0.2 - 1.2 mg/dL   Alkaline Phosphatase 58 39 - 117 U/L   AST 27 0 - 37 U/L   ALT 20 0 -  53 U/L   Total Protein 6.1 6.0 - 8.3 g/dL   Albumin 3.8 3.5 - 5.2 g/dL   GFR 40.98 (L) >11.91 mL/min   Calcium 8.9 8.4 - 10.5 mg/dL  Lipid panel  Result Value Ref Range   Cholesterol 144 0 - 200 mg/dL   Triglycerides 47.8 0.0 - 149.0 mg/dL   HDL 29.56 >21.30 mg/dL   VLDL 86.5 0.0 - 78.4 mg/dL   LDL Cholesterol 69 0 - 99 mg/dL   Total CHOL/HDL Ratio 2    NonHDL 85.91     Assessment & Plan:   Problem List Items Addressed This Visit     Medicare annual wellness visit, subsequent - Primary (Chronic)    I have personally reviewed the Medicare Annual Wellness questionnaire and have noted 1. The patient's medical and social history 2. Their use of alcohol, tobacco or illicit drugs 3. Their current medications and supplements 4. The  patient's functional ability including ADL's, fall risks, home safety risks and hearing or visual impairment. Cognitive function has been assessed and addressed as indicated.  5. Diet and physical activity 6. Evidence for depression or mood disorders The patients weight, height, BMI have been recorded in the chart. I have made referrals, counseling and provided education to the patient based on review of the above and I have provided the pt with a written personalized care plan for preventive services. Provider list updated.. See scanned questionairre as needed for further documentation. Reviewed preventative protocols and updated unless pt declined.       Encounter for general adult medical examination with abnormal findings (Chronic)    Preventative protocols reviewed and updated unless pt declined. Discussed healthy diet and lifestyle.       DNR (do not resuscitate) (Chronic)    Last confirmed 09/2021 DNR goldenrod form at home.       Permanent atrial fibrillation (Chronic)   Relevant Medications   apixaban (ELIQUIS) 5 MG TABS tablet   Advanced directives, counseling/discussion (Chronic)    Advanced planning - HCPOA and advanced directive scanned in chart 06/2015. Wife Pat then Wyvonna Plum then Earley Favor are [facility].       HTN (hypertension)    Chronic, deteriorated in office today, home cuff comparable.  Hasn't started lower ARB dose yet. Discussed concern lower dose may lead to even higher BP readings.  He will monitor at home and let me know if consistently elevated to return to higher telmisartan 80mg  dose.  Initially thought avodart would contribute - but likely not the case.       Relevant Medications   apixaban (ELIQUIS) 5 MG TABS tablet   HLD (hyperlipidemia)    Chronic, mild, stable period off medication besides OTC krill oil. The ASCVD Risk score (Arnett DK, et al., 2019) failed to calculate for the following reasons:   The 2019 ASCVD risk score is only valid for  ages 63 to 50       Relevant Medications   apixaban (ELIQUIS) 5 MG TABS tablet   Benign prostatic hyperplasia    Chronic, stable on avodart QOD.  Declines medication changes at this time      Hematospermia    This has been managed with avodart QOD.       Low serum vitamin B12    Continues daily MVI.       CKD stage 3a, GFR 45-59 ml/min    Progression in GFR from 60s to 40s over the past 4 years, 55-->47 in the past year.  Encouraged  good hydration status, avoiding NSAIDs.  Reviewed reassuring kidney imaging from CT urogram 2022.  RTC 6 mo CKD and HTN f/u.       Adenoma of left adrenal gland    Incidental finding on CT urogram 2022 - 1.7cm L adrenal adenoma.  Consider DHEAS and aldosterone/renin      Atherosclerosis of aorta   Relevant Medications   apixaban (ELIQUIS) 5 MG TABS tablet     Meds ordered this encounter  Medications   apixaban (ELIQUIS) 5 MG TABS tablet    Sig: Take 1 tablet (5 mg total) by mouth 2 (two) times daily.    Dispense:  180 tablet    Refill:  4    No orders of the defined types were placed in this encounter.   Patient Instructions  Good to see you today Monitor blood pressures at home and let me know if consistently >150 on top. If so, we will likely go back to telmisartan 80mg , consider dropping avodart (dutasteride).  Return in 6 months for follow up visit    Follow up plan: Return in about 6 months (around 05/03/2023) for follow up visit.  Eustaquio Boyden, MD

## 2022-11-01 NOTE — Assessment & Plan Note (Signed)
This has been managed with avodart QOD.

## 2022-11-01 NOTE — Assessment & Plan Note (Addendum)
Advanced planning - HCPOA and advanced directive scanned in chart 06/2015. Wife Michael Decker then Michael Decker then Michael Decker are HCPOA.  

## 2022-11-01 NOTE — Patient Instructions (Addendum)
Good to see you today Monitor blood pressures at home and let me know if consistently >150 on top. If so, we will likely go back to telmisartan 80mg , consider dropping avodart (dutasteride).  Return in 6 months for follow up visit

## 2022-11-01 NOTE — Assessment & Plan Note (Addendum)
Chronic, deteriorated in office today, home cuff comparable.  Hasn't started lower ARB dose yet. Discussed concern lower dose may lead to even higher BP readings.  He will monitor at home and let me know if consistently elevated to return to higher telmisartan  dose.  Initially thought avodart would contribute - but likely not the case.

## 2022-11-01 NOTE — Assessment & Plan Note (Signed)
Preventative protocols reviewed and updated unless pt declined. Discussed healthy diet and lifestyle.  

## 2022-11-03 ENCOUNTER — Encounter: Payer: Self-pay | Admitting: Family Medicine

## 2022-11-14 ENCOUNTER — Telehealth: Payer: Self-pay | Admitting: Family Medicine

## 2022-11-14 NOTE — Telephone Encounter (Signed)
Pt brings BP log from the past 10 days - BP 125-15/60-80, pulse 48-78.  Average 137/76, HR 59.  Overall stable readings on telmisartan  daily.

## 2022-12-03 ENCOUNTER — Encounter: Payer: Self-pay | Admitting: Family Medicine

## 2023-01-06 ENCOUNTER — Other Ambulatory Visit: Payer: Self-pay | Admitting: Family Medicine

## 2023-01-06 DIAGNOSIS — I10 Essential (primary) hypertension: Secondary | ICD-10-CM

## 2023-02-05 ENCOUNTER — Other Ambulatory Visit: Payer: Self-pay | Admitting: Family Medicine

## 2023-02-05 ENCOUNTER — Encounter: Payer: Self-pay | Admitting: Family Medicine

## 2023-02-05 DIAGNOSIS — I10 Essential (primary) hypertension: Secondary | ICD-10-CM

## 2023-02-15 ENCOUNTER — Encounter: Payer: Self-pay | Admitting: Family Medicine

## 2023-02-15 MED ORDER — TELMISARTAN 80 MG PO TABS: 80.0000 mg | ORAL_TABLET | Freq: Every day | ORAL | 1 refills | Status: DC

## 2023-02-15 NOTE — Addendum Note (Signed)
Addended by: Eustaquio Boyden on: 02/15/2023 07:40 PM   Modules accepted: Orders

## 2023-02-17 ENCOUNTER — Emergency Department
Admission: EM | Admit: 2023-02-17 | Discharge: 2023-02-17 | Disposition: A | Discharge status: Home / Self Care | Payer: PPO | Source: Home / Self Care | Attending: Emergency Medicine | Admitting: Emergency Medicine

## 2023-02-17 ENCOUNTER — Other Ambulatory Visit: Payer: Self-pay

## 2023-02-17 DIAGNOSIS — I509 Heart failure, unspecified: Secondary | ICD-10-CM | POA: Diagnosis not present

## 2023-02-17 DIAGNOSIS — N189 Chronic kidney disease, unspecified: Secondary | ICD-10-CM | POA: Diagnosis not present

## 2023-02-17 DIAGNOSIS — U071 COVID-19: Secondary | ICD-10-CM | POA: Insufficient documentation

## 2023-02-17 DIAGNOSIS — R0981 Nasal congestion: Secondary | ICD-10-CM | POA: Diagnosis present

## 2023-02-17 LAB — BASIC METABOLIC PANEL
Anion gap: 8 (ref 5–15)
BUN: 28 mg/dL — ABNORMAL HIGH (ref 8–23)
CO2: 25 mmol/L (ref 22–32)
Calcium: 8.7 mg/dL — ABNORMAL LOW (ref 8.9–10.3)
Chloride: 97 mmol/L — ABNORMAL LOW (ref 98–111)
Creatinine, Ser: 1.38 mg/dL — ABNORMAL HIGH (ref 0.61–1.24)
GFR, Estimated: 50 mL/min — ABNORMAL LOW (ref >60)
Glucose, Bld: 95 mg/dL (ref 70–99)
Potassium: 4.4 mmol/L (ref 3.5–5.1)
Sodium: 130 mmol/L — ABNORMAL LOW (ref 135–145)

## 2023-02-17 LAB — CBC WITH DIFFERENTIAL/PLATELET
Abs Immature Granulocytes: 0.01 10*3/uL (ref 0.00–0.07)
Basophils Absolute: 0 10*3/uL (ref 0.0–0.1)
Basophils Relative: 1 %
Eosinophils Absolute: 0 10*3/uL (ref 0.0–0.5)
Eosinophils Relative: 0 %
HCT: 44.1 % (ref 39.0–52.0)
Hemoglobin: 14.6 g/dL (ref 13.0–17.0)
Immature Granulocytes: 0 %
Lymphocytes Relative: 8 %
Lymphs Abs: 0.5 10*3/uL — ABNORMAL LOW (ref 0.7–4.0)
MCH: 33.6 pg (ref 26.0–34.0)
MCHC: 33.1 g/dL (ref 30.0–36.0)
MCV: 101.6 fL — ABNORMAL HIGH (ref 80.0–100.0)
Monocytes Absolute: 1.2 10*3/uL — ABNORMAL HIGH (ref 0.1–1.0)
Monocytes Relative: 20 %
Neutro Abs: 4.3 10*3/uL (ref 1.7–7.7)
Neutrophils Relative %: 71 %
Platelets: 163 10*3/uL (ref 150–400)
RBC: 4.34 MIL/uL (ref 4.22–5.81)
RDW: 12 % (ref 11.5–15.5)
WBC: 6.1 10*3/uL (ref 4.0–10.5)
nRBC: 0 % (ref 0.0–0.2)

## 2023-02-17 MED ORDER — DEXAMETHASONE SODIUM PHOSPHATE 10 MG/ML IJ SOLN
10.0000 mg | Freq: Once | INTRAMUSCULAR | Status: AC
  Administered 2023-02-17: 10 mg via INTRAMUSCULAR
  Filled 2023-02-17: qty 1

## 2023-02-17 MED ORDER — ONDANSETRON 4 MG PO TBDP: 4.0000 mg | ORAL_TABLET | Freq: Three times a day (TID) | ORAL | 0 refills | Status: DC | PRN

## 2023-02-17 MED ORDER — BENZONATATE 100 MG PO CAPS: 100.0000 mg | ORAL_CAPSULE | Freq: Three times a day (TID) | ORAL | 0 refills | Status: DC | PRN

## 2023-02-17 NOTE — ED Triage Notes (Signed)
Pt reports has coivd and is here for treatment. Denies SOB, CP, NVD.

## 2023-02-17 NOTE — ED Provider Notes (Signed)
[facility] Provider Note    Event Date/Time   First MD Initiated Contact with Patient 02/17/23 1230     (approximate)   History   Nasal Congestion and Generalized Body Aches   HPI  Michael Decker is a 89 y.o. male  with history of a-fib, OA, CHF, CKD, and as listed in EMR presents to the emergency department for treatment and evaluation of cough, nausea, headache, body aches, and fatigue. Home Covid test was positive. No known fever.      Physical Exam   Triage Vital Signs: ED Triage Vitals  Encounter Vitals Group     BP 02/17/23 1136 118/74     Systolic BP Percentile --      Diastolic BP Percentile --      Pulse Rate 02/17/23 1136 (!) 49     Resp 02/17/23 1140 20     Temp 02/17/23 1136 98.8 F (37.1 C)     Temp Source 02/17/23 1136 Oral     SpO2 02/17/23 1136 95 %     Weight 02/17/23 1107 171 lb (77.6 kg)     Height 02/17/23 1107 5\' 9"  (1.753 m)     Head Circumference --      Peak Flow --      Pain Score --      Pain Loc --      Pain Education --      Exclude from Growth Chart --     Most recent vital signs: Vitals:   02/17/23 1136 02/17/23 1140  BP: 118/74   Pulse: (!) 49   Resp:  20  Temp: 98.8 F (37.1 C)   SpO2: 95%     General: Awake, no distress.  CV:  Good peripheral perfusion.  Resp:  Normal effort. Breath sounds clear Abd:  No distention.  Other:     ED Results / Procedures / Treatments   Labs (all labs ordered are listed, but only abnormal results are displayed) Labs Reviewed  BASIC METABOLIC PANEL - Abnormal; Notable for the following components:      Result Value   Sodium 130 (*)    Chloride 97 (*)    BUN 28 (*)    Creatinine, Ser 1.38 (*)    Calcium 8.7 (*)    GFR, Estimated 50 (*)    All other components within normal limits  CBC WITH DIFFERENTIAL/PLATELET - Abnormal; Notable for the following components:   MCV 101.6 (*)    Lymphs Abs 0.5 (*)    Monocytes Absolute 1.2 (*)    All other components  within normal limits     EKG  Not indicated.   RADIOLOGY  Image and radiology report reviewed and interpreted by me. Radiology report consistent with the same.  Not indicated.  PROCEDURES:  Critical Care performed: No  Procedures   MEDICATIONS ORDERED IN ED:  Medications  dexamethasone (DECADRON) injection 10 mg (10 mg Intramuscular Given 02/17/23 1252)     IMPRESSION / MDM / ASSESSMENT AND PLAN / ED COURSE   I have reviewed the triage note.  Differential diagnosis includes, but is not limited to, Covid, pneumonia, viral syndrome  Patient's presentation is most consistent with acute illness / injury with system symptoms.  89 year old male presenting to the ER for treatment after home Covid test was positive. See HPI.  Plan will be to give IM decadron and prescribe cough medication and zofran. He is to follow up with primary care if not improving over  the week or sooner if symptoms worsen.      FINAL CLINICAL IMPRESSION(S) / ED DIAGNOSES   Final diagnoses:  COVID     Rx / DC Orders   ED Discharge Orders          Ordered    benzonatate (TESSALON PERLES) 100 MG capsule  3 times daily PRN        02/17/23 1346    ondansetron (ZOFRAN-ODT) 4 MG disintegrating tablet  Every 8 hours PRN        02/17/23 1346             Note:  This document was prepared using Dragon voice recognition software and may include unintentional dictation errors.   Chinita Pester, FNP 02/18/23 1008    Corena Herter, MD 02/20/23 2029

## 2023-02-19 ENCOUNTER — Telehealth: Payer: Self-pay | Admitting: Family Medicine

## 2023-02-19 NOTE — Telephone Encounter (Signed)
Spoke with Michael Decker at [facility] asking about rx. States they have it and will fill it for pt.   Spoke with pt relaying info above. Pt verbalizes understanding and expresses his thanks.

## 2023-02-19 NOTE — Telephone Encounter (Signed)
Patient contacted the office regarding medication telmisartan (MICARDIS) 80 MG tablet. States he is having trouble getting this medication filled at pharmacy. ON our end, shows that this medication has been filled and was confirmed on 7/25 by pharmacy, patient says he called pharmacy this morning and they have no record of this. Patient asked if something could be done, either for this to be re sent in or for the pharmacy to be contacted? Patient can be reached at home number.

## 2023-02-22 ENCOUNTER — Telehealth: Payer: Self-pay

## 2023-02-22 NOTE — Telephone Encounter (Signed)
Transition Care Management Follow-up Telephone Call Date of discharge and from where: 02/17/2023 [facility] How have you been since you were released from the hospital? Patient is feeling better, but his wife now has COVID. Any questions or concerns? No  Items Reviewed: Did the pt receive and understand the discharge instructions provided? Yes  Medications obtained and verified? Yes  Other? No  Any new allergies since your discharge? No  Dietary orders reviewed? Yes Do you have support at home? Yes   Follow up appointments reviewed:  PCP Hospital f/u appt confirmed?  Patient stated he is feeling better and does not need to follow up.  Scheduled to see  on  @ . Specialist Hospital f/u appt confirmed? No  Scheduled to see  on  @ . Are transportation arrangements needed? No  If their condition worsens, is the pt aware to call PCP or go to the Emergency Dept.? Yes Was the patient provided with contact information for the PCP's office or ED? Yes Was to pt encouraged to call back with questions or concerns? Yes  Sharnette Kitamura [facility]  [facility] Population Health Community Resource Care Guide   [email or web address]  ?? [phone]   Website: [email or web address]  [email or web address]

## 2023-03-06 ENCOUNTER — Encounter: Payer: Self-pay | Admitting: Family Medicine

## 2023-03-08 ENCOUNTER — Encounter (INDEPENDENT_AMBULATORY_CARE_PROVIDER_SITE_OTHER): Payer: Self-pay

## 2023-03-13 ENCOUNTER — Encounter: Payer: Self-pay | Admitting: Family Medicine

## 2023-04-01 ENCOUNTER — Other Ambulatory Visit: Payer: Self-pay | Admitting: Family Medicine

## 2023-04-01 DIAGNOSIS — E538 Deficiency of other specified B group vitamins: Secondary | ICD-10-CM

## 2023-04-01 DIAGNOSIS — N1831 Chronic kidney disease, stage 3a: Secondary | ICD-10-CM

## 2023-04-01 DIAGNOSIS — D3502 Benign neoplasm of left adrenal gland: Secondary | ICD-10-CM

## 2023-04-01 DIAGNOSIS — D7589 Other specified diseases of blood and blood-forming organs: Secondary | ICD-10-CM

## 2023-04-02 ENCOUNTER — Other Ambulatory Visit (INDEPENDENT_AMBULATORY_CARE_PROVIDER_SITE_OTHER): Payer: PPO

## 2023-04-02 DIAGNOSIS — N1831 Chronic kidney disease, stage 3a: Secondary | ICD-10-CM | POA: Diagnosis not present

## 2023-04-02 DIAGNOSIS — D3502 Benign neoplasm of left adrenal gland: Secondary | ICD-10-CM

## 2023-04-02 DIAGNOSIS — D7589 Other specified diseases of blood and blood-forming organs: Secondary | ICD-10-CM

## 2023-04-02 DIAGNOSIS — E538 Deficiency of other specified B group vitamins: Secondary | ICD-10-CM

## 2023-04-02 LAB — RENAL FUNCTION PANEL
Albumin: 3.7 g/dL (ref 3.5–5.2)
BUN: 24 mg/dL — ABNORMAL HIGH (ref 6–23)
CO2: 28 meq/L (ref 19–32)
Calcium: 9 mg/dL (ref 8.4–10.5)
Chloride: 102 meq/L (ref 96–112)
Creatinine, Ser: 1.28 mg/dL (ref 0.40–1.50)
GFR: 50.51 mL/min — ABNORMAL LOW (ref >60.00)
Glucose, Bld: 87 mg/dL (ref 70–99)
Phosphorus: 3.1 mg/dL (ref 2.3–4.6)
Potassium: 4.3 meq/L (ref 3.5–5.1)
Sodium: 137 meq/L (ref 135–145)

## 2023-04-02 LAB — VITAMIN B12: Vitamin B-12: 395 pg/mL (ref 211–911)

## 2023-04-02 LAB — FOLATE: Folate: >24.2 ng/mL (ref >5.9)

## 2023-04-04 ENCOUNTER — Telehealth: Payer: Self-pay | Admitting: Cardiology

## 2023-04-04 NOTE — Telephone Encounter (Signed)
Spoke to patient and informed him of the response from the provider as follows:  "Covid can really send the body into chaos. While I don't love BPs always in the 150s, they are not terrible and no need to make quick med changes when he recently had covid.  Let's continue to monitor for another month or so before making any medication changes.   Thanks, [employer]"  Patient understood with read back

## 2023-04-04 NOTE — Telephone Encounter (Signed)
Pt is requesting a callback from NP regarding some concerns he'd like to only speak with her about. Please advise

## 2023-04-06 LAB — ALDOSTERONE + RENIN ACTIVITY W/ RATIO
ALDO / PRA Ratio: 2.7 ratio (ref 0.9–28.9)
Aldosterone: 4 ng/dL
Renin Activity: 1.5 ng/mL/h (ref 0.25–5.82)

## 2023-04-06 LAB — DHEA-SULFATE: DHEA-SO4: 12 ug/dL (ref 3–225)

## 2023-04-06 LAB — HOMOCYSTEINE: Homocysteine: 13.7 umol/L — ABNORMAL HIGH (ref <11.4)

## 2023-04-06 LAB — METHYLMALONIC ACID, SERUM: Methylmalonic Acid, Quant: 176 nmol/L (ref 85–423)

## 2023-04-09 ENCOUNTER — Encounter: Payer: Self-pay | Admitting: Family Medicine

## 2023-04-09 ENCOUNTER — Ambulatory Visit (INDEPENDENT_AMBULATORY_CARE_PROVIDER_SITE_OTHER): Payer: PPO | Admitting: Family Medicine

## 2023-04-09 VITALS — BP 150/72 | HR 58 | Temp 97.4°F | Ht 71.0 in | Wt 172.1 lb

## 2023-04-09 DIAGNOSIS — N1831 Chronic kidney disease, stage 3a: Secondary | ICD-10-CM | POA: Diagnosis not present

## 2023-04-09 DIAGNOSIS — I10 Essential (primary) hypertension: Secondary | ICD-10-CM | POA: Diagnosis not present

## 2023-04-09 DIAGNOSIS — E538 Deficiency of other specified B group vitamins: Secondary | ICD-10-CM

## 2023-04-09 DIAGNOSIS — D3502 Benign neoplasm of left adrenal gland: Secondary | ICD-10-CM | POA: Diagnosis not present

## 2023-04-09 MED ORDER — AMLODIPINE BESYLATE 2.5 MG PO TABS: 2.5000 mg | ORAL_TABLET | Freq: Every day | ORAL | 3 refills | Status: DC

## 2023-04-09 NOTE — Assessment & Plan Note (Addendum)
Latest GFR 50.  Will aim for better blood pressure control - discussed goal at least <140/90 as long as tolerated well.  Start low dose amlodipine as per above.

## 2023-04-09 NOTE — Assessment & Plan Note (Addendum)
BP remaining elevated. This is despite telmisartan 80mg  daily. Will add amlodipine 2.5mg  daily. Reviewed side effects to monitor for. RTC 87mo HTN f/u visit.

## 2023-04-09 NOTE — Assessment & Plan Note (Signed)
Most recently B12 levels normal. HC mildly elevated, MMA normal pointing against B12 deficiency. Also folate levels >12 so doubt folate deficiency contributing.  Continue daily MVI

## 2023-04-09 NOTE — Patient Instructions (Addendum)
BP staying elevated. Continue telmisartan 80mg  daily, add amlodipine 2.5mg  daily sent to pharmacy.  Continue other medicines.  Return in 3 months for follow up visit

## 2023-04-09 NOTE — Assessment & Plan Note (Signed)
DHEAS normal, aldo/renin normal (03/2023).  Discussed with patient.

## 2023-04-09 NOTE — Progress Notes (Signed)
Ph: [phone] Fax: [phone]   Patient ID: Michael Decker, male    DOB: 1934-06-28, 89 y.o.   MRN: [id number]  This visit was conducted in person.  BP (!) 150/72   Pulse (!) 58   Temp (!) 97.4 F (36.3 C) (Temporal)   Ht 5\' 11"  (1.803 m)   Wt 172 lb 2 oz (78.1 kg)   SpO2 98%   BMI 24.01 kg/m   BP Readings from Last 3 Encounters:  04/09/23 (!) 150/72  02/17/23 118/74  11/01/22 (!) 165/76    160/80 on repeat testing  CC: 6 mo f/u visit  Subjective:   HPI: Michael Decker is a 89 y.o. male presenting on 04/09/2023 for Medical Management of Chronic Issues (Here for 6 mo f/u. Pt accompanied by wife, Elease Hashimoto. )   Just finished gym workout prior to appointment.  See prior note for details.  Recent COVID infection 01/2023 - notes elevated BP readings since then  HTN - Compliant with current antihypertensive regimen of telmisartan 80mg  daily.  Does check blood pressures at home: 140-150s/60-90s (10 pt increase since COVID).  No low blood pressure readings or symptoms of dizziness/syncope. Denies HA, vision changes, CP/tightness, SOB, leg swelling.   Known atrial fibrillation on eliquis 5mg  bid.  Progressive kidney disease - CT hematuria workup 05/2021 - normal kidneys.  Incidentally noted L 1.7cm adrenal adenoma - hormone workup was normal     Relevant past medical, surgical, family and social history reviewed and updated as indicated. Interim medical history since our last visit reviewed. Allergies and medications reviewed and updated. Outpatient Medications Prior to Visit  Medication Sig Dispense Refill   acetaminophen (TYLENOL) 650 MG CR tablet Take 1,300 mg by mouth at bedtime.     apixaban (ELIQUIS) 5 MG TABS tablet Take 1 tablet (5 mg total) by mouth 2 (two) times daily. 180 tablet 4   dutasteride (AVODART) 0.5 MG capsule TAKE 1 CAPSULE BY MOUTH EVERY OTHER DAY 45 capsule 3   Glucosamine-Chondroitin (OSTEO BI-FLEX REGULAR STRENGTH PO) Take 1 tablet by mouth at  bedtime.     Krill Oil 500 MG CAPS Take 1 capsule by mouth daily.      magnesium gluconate (MAGONATE) 500 MG tablet Take 500 mg by mouth daily.     MISC NATURAL PRODUCTS EX Apply topically. Cannabis cream     Multiple Vitamins-Minerals (MULTIVITAMIN WITH MINERALS) tablet Take 1 tablet by mouth daily.     telmisartan (MICARDIS) 80 MG tablet Take 1 tablet (80 mg total) by mouth daily. 90 tablet 1   benzonatate (TESSALON PERLES) 100 MG capsule Take 1 capsule (100 mg total) by mouth 3 (three) times daily as needed for cough. 30 capsule 0   calcium carbonate (TUMS - DOSED IN MG ELEMENTAL CALCIUM) 500 MG chewable tablet Chew 1 tablet by mouth as needed for indigestion or heartburn.     Coenzyme Q10 (COQ10) 200 MG CAPS Take 1 capsule by mouth daily at 10 pm.     ondansetron (ZOFRAN-ODT) 4 MG disintegrating tablet Take 1 tablet (4 mg total) by mouth every 8 (eight) hours as needed for nausea or vomiting. 20 tablet 0   No facility-administered medications prior to visit.     Per HPI unless specifically indicated in ROS section below Review of Systems  Objective:  BP (!) 150/72   Pulse (!) 58   Temp (!) 97.4 F (36.3 C) (Temporal)   Ht 5\' 11"  (1.803 m)   Wt 172 lb 2 oz (  78.1 kg)   SpO2 98%   BMI 24.01 kg/m   Wt Readings from Last 3 Encounters:  04/09/23 172 lb 2 oz (78.1 kg)  02/17/23 171 lb (77.6 kg)  11/01/22 177 lb 4 oz (80.4 kg)      Physical Exam Vitals and nursing note reviewed.  Constitutional:      Appearance: Normal appearance. He is not ill-appearing.  HENT:     Head: Normocephalic and atraumatic.     Mouth/Throat:     Mouth: Mucous membranes are moist.     Pharynx: Oropharynx is clear. No oropharyngeal exudate or posterior oropharyngeal erythema.  Eyes:     Extraocular Movements: Extraocular movements intact.     Pupils: Pupils are equal, round, and reactive to light.  Cardiovascular:     Rate and Rhythm: Normal rate. Rhythm irregular.     Pulses: Normal pulses.      Heart sounds: Normal heart sounds. No murmur heard. Pulmonary:     Effort: Pulmonary effort is normal. No respiratory distress.     Breath sounds: Normal breath sounds. No wheezing, rhonchi or rales.  Neurological:     Mental Status: He is alert.        Assessment & Plan:   Problem List Items Addressed This Visit     HTN (hypertension) - Primary    BP remaining elevated. This is despite telmisartan 80mg  daily. Will add amlodipine 2.5mg  daily. Reviewed side effects to monitor for. RTC 4mo HTN f/u visit.       Relevant Medications   amLODipine (NORVASC) 2.5 MG tablet   Low serum vitamin B12    Most recently B12 levels normal. HC mildly elevated, MMA normal pointing against B12 deficiency. Also folate levels >12 so doubt folate deficiency contributing.  Continue daily MVI      CKD stage 3a, GFR 45-59 ml/min (HCC)    Latest GFR 50.  Will aim for better blood pressure control - discussed goal at least <140/90 as long as tolerated well.  Start low dose amlodipine as per above.       Adenoma of left adrenal gland    DHEAS normal, aldo/renin normal (03/2023).  Discussed with patient.        Meds ordered this encounter  Medications   amLODipine (NORVASC) 2.5 MG tablet    Sig: Take 1 tablet (2.5 mg total) by mouth daily.    Dispense:  90 tablet    Refill:  3    No orders of the defined types were placed in this encounter.   Patient Instructions  BP staying elevated. Continue telmisartan 80mg  daily, add amlodipine 2.5mg  daily sent to pharmacy.  Continue other medicines.  Return in 3 months for follow up visit   Follow up plan: Return in about 3 months (around 07/09/2023), or if symptoms worsen or fail to improve, for follow up visit.  Eustaquio Boyden, MD

## 2023-05-17 ENCOUNTER — Ambulatory Visit: Payer: PPO | Admitting: Internal Medicine

## 2023-05-17 ENCOUNTER — Encounter: Payer: Self-pay | Admitting: Family Medicine

## 2023-05-17 ENCOUNTER — Encounter: Payer: Self-pay | Admitting: Internal Medicine

## 2023-05-17 VITALS — BP 120/64 | HR 46 | Temp 98.2°F | Ht 71.0 in | Wt 183.0 lb

## 2023-05-17 DIAGNOSIS — Z23 Encounter for immunization: Secondary | ICD-10-CM

## 2023-05-17 DIAGNOSIS — R001 Bradycardia, unspecified: Secondary | ICD-10-CM | POA: Diagnosis not present

## 2023-05-17 NOTE — Progress Notes (Signed)
Cardiology Office Note Date:  05/18/2023  Patient ID:  Michael Decker, DOB 01/02/1934, MRN [id number] PCP:  Eustaquio Boyden, MD  Cardiologist:  None Electrophysiologist: Lanier Prude, MD   Chief Complaint: bradycardia  History of Present Illness: Michael Decker is a 88 y.o. male with PMH notable for perm Afib, HTN, HFrecEF, orthostatic hypotension and bradycardia; seen today for Lanier Prude, MD for urgent visit to eval bradycardia.  I last saw him 10/2022, he had stopped coreg months prior and felt well. He was bradycardic during visit to 50s, but working out regularly and biking, HR was rising to mid-80s with exercise. Had some orthostatic symptoms, measurements in office + for ortho hypoTN. He saw primary MD yesterday, pulse was 42 and he c/o decreased exercise tolerance. Rec urgent cardiology eval.   On follow-up today, he has noticed that his overall resting pulse has decreased to the 40s. Has significantly less energy and feels like he could nap at any time. He also has mild headache most days. No vision changes. He has intermittent dizziness with position changes. No syncope or significant presyncope. Sits and rests, and dizziness resolves. He continues to workout 3 times a week, but feels like he has to force himself to do it. Pulse rises to 60s during workouts.  He brings a pulse and BP long. Pulses 40-50; BP 130-150s systolic, rarely 433 systolic.   Diligently takes eliquis BID, no bleeding concerns.     Past Medical History:  Diagnosis Date   Arthritis    knee   BPH (benign prostatic hypertrophy)    on avodart, released from urologist care   Colon polyps 2013   TA 2013 [facility])   Diverticulosis    GERD (gastroesophageal reflux disease)    Hematospermia    s/p uro eval   HLD (hyperlipidemia)    mild   HTN (hypertension)    Wears hearing aid in left ear     Past Surgical History:  Procedure Laterality Date   BUNIONECTOMY Right 2014   CARDIOVERSION N/A  07/09/2020   Procedure: CARDIOVERSION;  Surgeon: Iran Ouch, MD;  Location: [facility] OR[facility];  Service: Cardiovascular;  Laterality: N/A;   CATARACT EXTRACTION W/PHACO Right 08/14/2018   Procedure: CATARACT EXTRACTION PHACO AND INTRAOCULAR LENS PLACEMENT (IOC)  RIGHT;  Surgeon: Lockie Mola, MD;  Location: [facility];  Service: Ophthalmology;  Laterality: Right;  requests arrival between 830 & 1030   CATARACT EXTRACTION W/PHACO Left 09/11/2018   Procedure: CATARACT EXTRACTION PHACO AND INTRAOCULAR LENS PLACEMENT (IOC)  LEFT;  Surgeon: Lockie Mola, MD;  Location: [facility];  Service: Ophthalmology;  Laterality: Left;  Doesn't want to be early   COLONOSCOPY  2008   COLONOSCOPY  01/2012   TA x1, diverticulosis, rpt 5 yrs [facility])   COLONOSCOPY  02/2017   TA, diverticulosis, int hem no f/u needed [facility])   CYSTECTOMY     TONSILLECTOMY  1944   UPPER GI ENDOSCOPY      Current Outpatient Medications  Medication Instructions   acetaminophen (TYLENOL) 1,300 mg, Oral, Nightly   amLODipine (NORVASC) 2.5 mg, Oral, Daily   apixaban (ELIQUIS) 5 mg, Oral, 2 times daily   dutasteride (AVODART) 0.5 MG capsule TAKE 1 CAPSULE BY MOUTH EVERY OTHER DAY   Glucosamine-Chondroitin (OSTEO BI-FLEX REGULAR STRENGTH PO) 1 tablet, Oral, Daily at bedtime   Krill Oil 500 MG CAPS 1 capsule, Oral, Daily   magnesium gluconate (MAGONATE) 500 mg, Oral, Daily   MISC NATURAL PRODUCTS EX Apply  externally, Cannabis cream   Multiple Vitamins-Minerals (MULTIVITAMIN WITH MINERALS) tablet 1 tablet, Daily   telmisartan (MICARDIS) 80 mg, Oral, Daily    Social History:  The patient  reports that he has quit smoking. He has never used smokeless tobacco. He reports current alcohol use. He reports that he does not use drugs.   Family History:  The patient's family history includes Heart disease (age of onset: 44) in his brother; Sudden death (age of onset: 36) in his father.  ROS:  Please see the  history of present illness. All other systems are reviewed and otherwise negative.   PHYSICAL EXAM:  VS:  BP (!) 148/72 (BP Location: Left Arm, Patient Position: Sitting)   Pulse (!) 42   Ht 5\' 10"  (1.778 m)   Wt 183 lb 3.2 oz (83.1 kg)   SpO2 98%   BMI 26.29 kg/m  BMI: Body mass index is 26.29 kg/m.  Pulse increased to 59 with walking around clinic    GEN- The patient is well appearing, alert and oriented x 3 today.   Lungs- Clear to ausculation bilaterally, normal work of breathing.   Heart- Regular, marked bradycardic rate and rhythm, no murmurs, rubs or gallops,  Extremities- No peripheral edema, warm, dry   EKG is not ordered. Personal review of EKG from  05/16/2023  shows:  Afib, rate 43bpm  10/31/2022: Afib, rate 57bpm  Recent Labs: 10/24/2022: ALT 20 02/17/2023: Hemoglobin 14.6; Platelets 163 04/02/2023: BUN 24; Creatinine, Ser 1.28; Potassium 4.3; Sodium 137  10/24/2022: Cholesterol 144; HDL 57.70; LDL Cholesterol 69; Total CHOL/HDL Ratio 2; Triglycerides 84.0; VLDL 16.8   CrCl cannot be calculated (Patient's most recent lab result is older than the maximum 21 days allowed.).   Wt Readings from Last 3 Encounters:  05/18/23 183 lb 3.2 oz (83.1 kg)  05/17/23 183 lb (83 kg)  04/09/23 172 lb 2 oz (78.1 kg)     Additional studies reviewed include: Previous EP, cardiology notes.   TTE 03/23/2022  1. Left ventricular ejection fraction, by estimation, is 60 to 65%. The left ventricle has normal function. The left ventricle has no regional wall motion abnormalities. Left ventricular diastolic parameters are indeterminate.   2. Right ventricular systolic function is normal. The right ventricular size is normal. Tricuspid regurgitation signal is inadequate for assessing PA pressure.   3. Left atrial size was mildly dilated.   4. The mitral valve is normal in structure. Mild mitral valve regurgitation. No evidence of mitral stenosis.   5. The aortic valve is normal in structure.  Aortic valve regurgitation is not visualized. Aortic valve sclerosis is present, with no evidence of aortic valve stenosis.   6. The inferior vena cava is normal in size with greater than 50% respiratory variability, suggesting right atrial pressure of 3 mmHg.   TTE 07/13/2020  1. Left ventricular ejection fraction, by estimation, is 45 to 50%. The left ventricle has mildly decreased function. Left ventricular endocardial border not optimally defined to evaluate regional wall motion. Left ventricular diastolic parameters are  indeterminate.   2. Right ventricular systolic function is normal. The right ventricular size is mildly enlarged. Tricuspid regurgitation signal is inadequate for assessing PA pressure.   3. Left atrial size was mildly dilated.   4. Right atrial size was moderately dilated.   5. The mitral valve is normal in structure. Mild to moderate mitral valve regurgitation. No evidence of mitral stenosis.   6. The aortic valve is normal in structure. Aortic valve regurgitation is not  visualized. Mild to moderate aortic valve sclerosis/calcification is present, without any evidence of aortic stenosis.   7. Aortic dilatation noted. There is mild dilatation of the ascending aorta, measuring 40 mm.   8. The inferior vena cava is dilated in size with >50% respiratory variability, suggesting right atrial pressure of 8 mmHg.    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 Resting pulse is now frequently in 40s with only minimal rise to 60s during exercise. He is also having significantly increased fatigue and intermittent dizziness with activities Not on an AV nodal blocking agents for many months Long discussion about indications for PPM, and he is agreeable to proceed with procedure Risks/benefits reviewed. He verbalized understanding and wished to proceed.  He is hemodynamically  stable without presyncope. 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   The patient does not have concerns regarding his medicines.  The following changes were made today:   none  Labs/ tests ordered today include:  Orders Placed This Encounter  Procedures   CBC   Basic metabolic panel     Disposition: Follow up with Dr. Lalla Brothers or EP APP in  for [facility] implant 11/19   Follow-up with EP APP 10-14d after PPM implant    Signed, Sherie Don, NP  05/18/23  9:13 PM  Electrophysiology [facility]

## 2023-05-17 NOTE — Telephone Encounter (Signed)
He certainly needs an EKG to see if he is in heart block

## 2023-05-17 NOTE — Telephone Encounter (Signed)
Patient called in to returning a call he received.

## 2023-05-17 NOTE — Telephone Encounter (Signed)
Unable to reach pt by phone and left v/m requesting pt to call [phone] also my chart pt to cvall for more info,

## 2023-05-17 NOTE — Telephone Encounter (Signed)
I spoke with pt; pt said has had low heart rate since covid in [place]. This week heart rate been in low 50's and upper 40s. 05/17/23 BP 141/63 P 41. Pt had slight dull mid CP earlier this morning that only last few mins pt has had CP on and off for 2 weeks. Pt said he gets tired easily. Pt has not seen card in over 2 years. Pt said he does not need to go to ED. Pt scheduled appt with Dr Alphonsus Sias 05/17/23 at 2:15 with UC & ED precautions and pt voiced understanding. Pt said he would like to be seen today to find out why his heart rate is so low. Sending note to Dr Alphonsus Sias and Alphonsus Sias pool

## 2023-05-17 NOTE — Telephone Encounter (Signed)
Plz triage pt.  °

## 2023-05-17 NOTE — Assessment & Plan Note (Signed)
Has had symptoms for the past week or 10 days Did have rate of 47 in July when seen in ER for COVID----but no specific symptoms EKG is atrial fib or flutter--but no ischemia or hypertrophy Echo showed normal EF in 2023--and no CHF now Only on amlodipine --so no nodal blocking agents  He probably needs a pacemaker Will message [facility] cardiologists as well as Dr Lalla Brothers to try to get him in as soon as possible

## 2023-05-17 NOTE — Progress Notes (Signed)
Subjective:    Patient ID: Michael Decker, male    DOB: January 14, 1934, 89 y.o.   MRN: [id number]  HPI Here with wife due to slow heart rate  He has lost a lot of stamina---but still exercises 3 days per week Seems noticeable recently---last week or 10 days Wife had recent surgery---so he has had to do more of the housework/cooking, etc Has noticed low heart rates at home--- low 40's in the past week  No chest pain--but occasional uncomfortable feeling in upper left chest---brief (upon awakening--not exertional) Gets SOB easier No dizziness or syncope  Current Outpatient Medications on File Prior to Visit  Medication Sig Dispense Refill   acetaminophen (TYLENOL) 650 MG CR tablet Take 1,300 mg by mouth at bedtime.     amLODipine (NORVASC) 2.5 MG tablet Take 1 tablet (2.5 mg total) by mouth daily. 90 tablet 3   apixaban (ELIQUIS) 5 MG TABS tablet Take 1 tablet (5 mg total) by mouth 2 (two) times daily. 180 tablet 4   dutasteride (AVODART) 0.5 MG capsule TAKE 1 CAPSULE BY MOUTH EVERY OTHER DAY 45 capsule 3   Glucosamine-Chondroitin (OSTEO BI-FLEX REGULAR STRENGTH PO) Take 1 tablet by mouth at bedtime.     Krill Oil 500 MG CAPS Take 1 capsule by mouth daily.      magnesium gluconate (MAGONATE) 500 MG tablet Take 500 mg by mouth daily.     MISC NATURAL PRODUCTS EX Apply topically. Cannabis cream     Multiple Vitamins-Minerals (MULTIVITAMIN WITH MINERALS) tablet Take 1 tablet by mouth daily.     telmisartan (MICARDIS) 80 MG tablet Take 1 tablet (80 mg total) by mouth daily. 90 tablet 1   No current facility-administered medications on file prior to visit.    Allergies  Allergen Reactions   Metoprolol Rash    Past Medical History:  Diagnosis Date   Arthritis    knee   BPH (benign prostatic hypertrophy)    on avodart, released from urologist care   Colon polyps 2013   TA 2013 [facility])   Diverticulosis    GERD (gastroesophageal reflux disease)    Hematospermia    s/p uro eval    HLD (hyperlipidemia)    mild   HTN (hypertension)    Wears hearing aid in left ear     Past Surgical History:  Procedure Laterality Date   BUNIONECTOMY Right 2014   CARDIOVERSION N/A 07/09/2020   Procedure: CARDIOVERSION;  Surgeon: Iran Ouch, MD;  Location: [facility] OR[facility];  Service: Cardiovascular;  Laterality: N/A;   CATARACT EXTRACTION W/PHACO Right 08/14/2018   Procedure: CATARACT EXTRACTION PHACO AND INTRAOCULAR LENS PLACEMENT (IOC)  RIGHT;  Surgeon: Lockie Mola, MD;  Location: [facility];  Service: Ophthalmology;  Laterality: Right;  requests arrival between 830 & 1030   CATARACT EXTRACTION W/PHACO Left 09/11/2018   Procedure: CATARACT EXTRACTION PHACO AND INTRAOCULAR LENS PLACEMENT (IOC)  LEFT;  Surgeon: Lockie Mola, MD;  Location: [facility];  Service: Ophthalmology;  Laterality: Left;  Doesn't want to be early   COLONOSCOPY  2008   COLONOSCOPY  01/2012   TA x1, diverticulosis, rpt 5 yrs [facility])   COLONOSCOPY  02/2017   TA, diverticulosis, int hem no f/u needed [facility])   CYSTECTOMY     TONSILLECTOMY  1944   UPPER GI ENDOSCOPY      Family History  Problem Relation Age of Onset   Sudden death Father 68       ?MI   Heart disease Brother 57       ?  afib   Cancer Neg Hx    Diabetes Neg Hx    Stroke Neg Hx    Colon cancer Neg Hx    Esophageal cancer Neg Hx    Stomach cancer Neg Hx     Social History   Socioeconomic History   Marital status: Married    Spouse name: Not on file   Number of children: Not on file   Years of education: Not on file   Highest education level: Bachelor's degree (e.g., BA, AB, BS)  Occupational History   Not on file  Tobacco Use   Smoking status: Former   Smokeless tobacco: Never   Tobacco comments:    cigars back in the 60's  Vaping Use   Vaping status: Never Used  Substance and Sexual Activity   Alcohol use: Yes    Comment: occassionally-beer or wine   Drug use: No   Sexual activity: Yes     Partners: Female  Other Topics Concern   Not on file  Social History Narrative   Lives with wife   Grown children   Occupation: prior worked with Horticulturist, commercial as Public affairs consultant; volunteers at [employer] for [employer] in [place]   Edu: BS   Activity: works out at [employer] 3x/wk, on recumbent bike   Diet: some water, fruits/vegetables daily         Social Determinants of Health   Financial Resource Strain: Low Risk  (04/05/2023)   Overall Financial Resource Strain (CARDIA)    Difficulty of Paying Living Expenses: Not hard at all  Food Insecurity: No Food Insecurity (04/05/2023)   Hunger Vital Sign    Worried About Running Out of Food in the Last Year: Never true    Ran Out of Food in the Last Year: Never true  Transportation Needs: No Transportation Needs (04/05/2023)   PRAPARE - Administrator, Civil Service (Medical): No    Lack of Transportation (Non-Medical): No  Physical Activity: Sufficiently Active (04/05/2023)   Exercise Vital Sign    Days of Exercise per Week: 3 days    Minutes of Exercise per Session: 70 min  Stress: No Stress Concern Present (04/05/2023)   [employer] of Occupational Health - Occupational Stress Questionnaire    Feeling of Stress : Not at all  Social Connections: Unknown (04/05/2023)   Social Connection and Isolation Panel [NHANES]    Frequency of Communication with Friends and Family: Twice a week    Frequency of Social Gatherings with Friends and Family: Twice a week    Attends Religious Services: Patient declined    Database administrator or Organizations: Yes    Attends Engineer, structural: 1 to 4 times per year    Marital Status: Married  Catering manager Violence: Not on file   Review of Systems Eating okay  Sleep hasn't been great lately--some trouble with right foot (some throbbing/tingling in great toe)---goes back 6 months     Objective:   Physical Exam         Assessment & Plan:

## 2023-05-18 ENCOUNTER — Encounter: Payer: Self-pay | Admitting: Cardiology

## 2023-05-18 ENCOUNTER — Ambulatory Visit: Payer: PPO | Attending: Cardiology | Admitting: Cardiology

## 2023-05-18 VITALS — BP 148/72 | HR 42 | Ht 70.0 in | Wt 183.2 lb

## 2023-05-18 DIAGNOSIS — I10 Essential (primary) hypertension: Secondary | ICD-10-CM

## 2023-05-18 DIAGNOSIS — I4821 Permanent atrial fibrillation: Secondary | ICD-10-CM

## 2023-05-18 DIAGNOSIS — I951 Orthostatic hypotension: Secondary | ICD-10-CM | POA: Diagnosis not present

## 2023-05-18 NOTE — Patient Instructions (Signed)
Medication Instructions:  The current medical regimen is effective;  continue present plan and medications.  *If you need a refill on your cardiac medications before your next appointment, please call your pharmacy*   Lab Work: Your provider would like for you to have following labs drawn today CBC, BMET.   If you have labs (blood work) drawn today and your tests are completely normal, you will receive your results only by: MyChart Message (if you have MyChart) OR A paper copy in the mail If you have any lab test that is abnormal or we need to change your treatment, we will call you to review the results.   Testing/Procedures: PPM IMPLANT (11/19)- follow instruction sheet given to you today.  Follow-Up: At [facility], you and your health needs are our priority.  As part of our continuing mission to provide you with exceptional heart care, we have created designated Provider Care Teams.  These Care Teams include your primary Cardiologist (physician) and Advanced Practice Providers (APPs -  Physician Assistants and Nurse Practitioners) who all work together to provide you with the care you need, when you need it.  We recommend signing up for the patient portal called "MyChart".  Sign up information is provided on this After Visit Summary.  MyChart is used to connect with patients for Virtual Visits (Telemedicine).  Patients are able to view lab/test results, encounter notes, upcoming appointments, etc.  Non-urgent messages can be sent to your provider as well.   To learn more about what you can do with MyChart, go to [email or web address].    Your next appointment:   They will call you for follow up appointments.   Other Instructions Follow instruction sheet give to you today.

## 2023-05-19 LAB — CBC
Hematocrit: 42.5 % (ref 37.5–51.0)
Hemoglobin: 13.5 g/dL (ref 13.0–17.7)
MCH: 33.9 pg — ABNORMAL HIGH (ref 26.6–33.0)
MCHC: 31.8 g/dL (ref 31.5–35.7)
MCV: 107 fL — ABNORMAL HIGH (ref 79–97)
Platelets: 164 10*3/uL (ref 150–450)
RBC: 3.98 x10E6/uL — ABNORMAL LOW (ref 4.14–5.80)
RDW: 11.8 % (ref 11.6–15.4)
WBC: 5.4 10*3/uL (ref 3.4–10.8)

## 2023-05-19 LAB — BASIC METABOLIC PANEL
BUN/Creatinine Ratio: 19 (ref 10–24)
BUN: 23 mg/dL (ref 8–27)
CO2: 24 mmol/L (ref 20–29)
Calcium: 9 mg/dL (ref 8.6–10.2)
Chloride: 98 mmol/L (ref 96–106)
Creatinine, Ser: 1.19 mg/dL (ref 0.76–1.27)
Glucose: 74 mg/dL (ref 70–99)
Potassium: 5 mmol/L (ref 3.5–5.2)
Sodium: 136 mmol/L (ref 134–144)
eGFR: 59 mL/min/{1.73_m2} — ABNORMAL LOW (ref >59)

## 2023-05-23 ENCOUNTER — Telehealth: Payer: Self-pay | Admitting: Cardiology

## 2023-05-23 NOTE — Telephone Encounter (Signed)
Patient said that he is scheduled to have a pacemaker put in in November and has a couple of concerns he would like to discus

## 2023-05-23 NOTE — Telephone Encounter (Signed)
Attempted to contact patient to discuss questions he may have to send to NP to be made aware to best assist him. Patient states "I only want to speak to [place], and I do not want to speak with anyone else"   Will route to NP.

## 2023-05-23 NOTE — Telephone Encounter (Signed)
I returned patient's call He questioned what parts of his body needed bathing in the CHG soap prior to PPM implant, advised that whole body except for head/face and genitalia need to be washed in CHG.  He also questioned how he should notify us if he becomes acutely symptomatic at 2am with dizziness, lightheaded, presyncope. I recommended he proceed to ER if he become acutely symptomatic at all.   Patient thanked me for the return call.

## 2023-06-03 ENCOUNTER — Encounter: Payer: Self-pay | Admitting: Family Medicine

## 2023-06-11 ENCOUNTER — Encounter: Payer: Self-pay | Admitting: *Deleted

## 2023-06-11 NOTE — Pre-Procedure Instructions (Signed)
Instructed patient on the following items: Arrival time 0800 Nothing to eat or drink after midnight No meds AM of procedure Responsible person to drive you home and stay with you for 24 hrs Wash with special soap night before and morning of procedure If on anti-coagulant drug instructions Eliquis- last dose Saturday 11/16.

## 2023-06-12 ENCOUNTER — Ambulatory Visit (HOSPITAL_COMMUNITY): Payer: PPO

## 2023-06-12 ENCOUNTER — Other Ambulatory Visit: Payer: Self-pay

## 2023-06-12 ENCOUNTER — Ambulatory Visit (HOSPITAL_COMMUNITY)
Admission: RE | Admit: 2023-06-12 | Discharge: 2023-06-12 | Disposition: A | Discharge status: Home / Self Care | Payer: PPO | Attending: Cardiology | Admitting: Cardiology

## 2023-06-12 ENCOUNTER — Encounter (HOSPITAL_COMMUNITY)
Admission: RE | Disposition: A | Discharge status: Home / Self Care | Payer: Self-pay | Source: Home / Self Care | Attending: Cardiology

## 2023-06-12 DIAGNOSIS — I5022 Chronic systolic (congestive) heart failure: Secondary | ICD-10-CM | POA: Diagnosis not present

## 2023-06-12 DIAGNOSIS — R001 Bradycardia, unspecified: Secondary | ICD-10-CM

## 2023-06-12 DIAGNOSIS — Z7901 Long term (current) use of anticoagulants: Secondary | ICD-10-CM | POA: Insufficient documentation

## 2023-06-12 DIAGNOSIS — Z87891 Personal history of nicotine dependence: Secondary | ICD-10-CM | POA: Diagnosis not present

## 2023-06-12 DIAGNOSIS — Z950 Presence of cardiac pacemaker: Secondary | ICD-10-CM | POA: Insufficient documentation

## 2023-06-12 DIAGNOSIS — I110 Hypertensive heart disease with heart failure: Secondary | ICD-10-CM | POA: Insufficient documentation

## 2023-06-12 DIAGNOSIS — I4821 Permanent atrial fibrillation: Secondary | ICD-10-CM | POA: Diagnosis not present

## 2023-06-12 HISTORY — PX: PACEMAKER IMPLANT: EP1218

## 2023-06-12 SURGERY — PACEMAKER IMPLANT

## 2023-06-12 MED ORDER — ACETAMINOPHEN 325 MG PO TABS: 325.0000 mg | ORAL_TABLET | ORAL | Status: DC | PRN

## 2023-06-12 MED ORDER — SODIUM CHLORIDE 0.9 % IV SOLN
INTRAVENOUS | Status: AC
  Filled 2023-06-12: qty 2

## 2023-06-12 MED ORDER — MIDAZOLAM HCL 5 MG/5ML IJ SOLN
INTRAMUSCULAR | Status: DC | PRN
  Administered 2023-06-12 (×2): 1 mg via INTRAVENOUS

## 2023-06-12 MED ORDER — SODIUM CHLORIDE 0.9 % IV SOLN: INTRAVENOUS | Status: DC

## 2023-06-12 MED ORDER — HEPARIN (PORCINE) IN NACL 1000-0.9 UT/500ML-% IV SOLN
INTRAVENOUS | Status: DC | PRN
  Administered 2023-06-12: 500 mL

## 2023-06-12 MED ORDER — CEFAZOLIN SODIUM-DEXTROSE 2-4 GM/100ML-% IV SOLN: 2.0000 g | INTRAVENOUS | Status: AC

## 2023-06-12 MED ORDER — MIDAZOLAM HCL 5 MG/5ML IJ SOLN
INTRAMUSCULAR | Status: AC
  Filled 2023-06-12: qty 5

## 2023-06-12 MED ORDER — FENTANYL CITRATE (PF) 100 MCG/2ML IJ SOLN
INTRAMUSCULAR | Status: AC
  Filled 2023-06-12: qty 2

## 2023-06-12 MED ORDER — CHLORHEXIDINE GLUCONATE 4 % EX SOLN
4.0000 | Freq: Once | CUTANEOUS | Status: DC
  Filled 2023-06-12: qty 60

## 2023-06-12 MED ORDER — CEFAZOLIN SODIUM-DEXTROSE 2-4 GM/100ML-% IV SOLN
INTRAVENOUS | Status: AC
  Administered 2023-06-12: 2 g via INTRAVENOUS
  Filled 2023-06-12: qty 100

## 2023-06-12 MED ORDER — ONDANSETRON HCL 4 MG/2ML IJ SOLN: 4.0000 mg | Freq: Four times a day (QID) | INTRAMUSCULAR | Status: DC | PRN

## 2023-06-12 MED ORDER — LIDOCAINE HCL (PF) 1 % IJ SOLN
INTRAMUSCULAR | Status: DC | PRN
  Administered 2023-06-12: 60 mL

## 2023-06-12 MED ORDER — SODIUM CHLORIDE 0.9 % IV SOLN
80.0000 mg | INTRAVENOUS | Status: AC
  Administered 2023-06-12: 80 mg

## 2023-06-12 MED ORDER — FENTANYL CITRATE (PF) 100 MCG/2ML IJ SOLN
INTRAMUSCULAR | Status: DC | PRN
  Administered 2023-06-12 (×2): 25 ug via INTRAVENOUS

## 2023-06-12 MED ORDER — LIDOCAINE HCL 1 % IJ SOLN
INTRAMUSCULAR | Status: AC
  Filled 2023-06-12: qty 60

## 2023-06-12 SURGICAL SUPPLY — 16 items
ADAPTER SEALING SSSA-09 (ADAPTER) IMPLANT
CABLE SURGICAL S-101-97-12 (CABLE) ×1 IMPLANT
CATH HIS SELECTSITE C304HIS (CATHETERS) IMPLANT
CATH RIGHTSITE C315HIS02 (CATHETERS) IMPLANT
IPG PACE AZUR XT SR MRI W1SR01 (Pacemaker) IMPLANT
LEAD SELECT SECURE 3830 383069 (Lead) IMPLANT
PACE AZURE XT SR MRI W1SR01 (Pacemaker) ×1 IMPLANT
PAD DEFIB RADIO PHYSIO CONN (PAD) ×1 IMPLANT
SELECT SECURE 3830 383069 (Lead) ×1 IMPLANT
SHEATH 7FR PRELUDE SNAP 13 (SHEATH) IMPLANT
SHEATH 9FR PRELUDE SNAP 13 (SHEATH) IMPLANT
SHEATH PROBE COVER 6X72 (BAG) IMPLANT
SLITTER 6232ADJ (MISCELLANEOUS) IMPLANT
TOOL TRANSVALV INSERT TVI-07 (MISCELLANEOUS) IMPLANT
TRAY PACEMAKER INSERTION (PACKS) ×1 IMPLANT
WIRE HI TORQ VERSACORE-J 145CM (WIRE) IMPLANT

## 2023-06-12 NOTE — Discharge Instructions (Addendum)
After Your Pacemaker   You have a Med[employer]onic Pacemaker  ACTIVITY Do not lift your arm above shoulder height for 1 week after your procedure. After 7 days, you may progress as below.  You should remove your sling 24 hours after your procedure, unless otherwise instructed by your provider.     Tuesday June 19, 2023  Wednesday June 20, 2023 Thursday June 21, 2023 Friday June 22, 2023   Do not lift, push, pull, or carry anything over 10 pounds with the affected arm until 6 weeks (Tuesday July 24, 2023 ) after your procedure.   You may drive AFTER your wound check, unless you have been told otherwise by your provider.   Ask your healthcare provider when you can go back to work   INCISION/Dressing Do not resume Eliquis until 06/18/23 morning  If large square, outer bandage is left in place, this can be removed after 24 hours from your procedure. Do not remove steri-strips or glue as below.   If a PRESSURE DRESSING (a bulky dressing that usually goes up over your shoulder) was applied or left in place, please follow instructions given by your provider on when to return to have this removed.   Monitor your Pacemaker site for redness, swelling, and drainage. Call the device clinic at [phone] if you experience these symptoms or fever/chills.  If your incision is sealed with Steri-strips or staples, you may shower 7 days after your procedure or when told by your provider. Do not remove the steri-strips or let the shower hit directly on your site. You may wash around your site with soap and water.    If you were discharged in a sling, please do not wear this during the day more than 48 hours after your surgery unless otherwise instructed. This may increase the risk of stiffness and soreness in your shoulder.   Avoid lotions, ointments, or perfumes over your incision until it is well-healed.  You may use a hot tub or a pool AFTER your wound check appointment if the  incision is completely closed.  Pacemaker Alerts:  Some alerts are vibratory and others beep. These are NOT emergencies. Please call our office to let us know. If this occurs at night or on weekends, it can wait until the next business day. Send a remote transmission.  If your device is capable of reading fluid status (for heart failure), you will be offered monthly monitoring to review this with you.   DEVICE MANAGEMENT Remote monitoring is used to monitor your pacemaker from home. This monitoring is scheduled every 91 days by our office. It allows [place] to keep an eye on the functioning of your device to ensure it is working properly. You will routinely see your Electrophysiologist annually (more often if necessary).   You should receive your ID card for your new device in 4-8 weeks. Keep this card with you at all times once received. Consider wearing a medical alert bracelet or necklace.  Your Pacemaker may be MRI compatible. This will be discussed at your next office visit/wound check.  You should avoid contact with strong electric or magnetic fields.   Do not use amateur (ham) radio equipment or electric (arc) welding torches. MP3 player headphones with magnets should not be used. Some devices are safe to use if held at least 12 inches (30 cm) from your Pacemaker. These include power tools, lawn mowers, and speakers. If you are unsure if something is safe to use, ask your health care provider.  When using your cell phone, hold it to the ear that is on the opposite side from the Pacemaker. Do not leave your cell phone in a pocket over the Pacemaker.  You may safely use electric blankets, heating pads, computers, and microwave ovens.  Call the office right away if: You have chest pain. You feel more short of breath than you have felt before. You feel more light-headed than you have felt before. Your incision starts to open up.  This information is not intended to replace advice given to you  by your health care provider. Make sure you discuss any questions you have with your health care provider.

## 2023-06-12 NOTE — H&P (Signed)
Electrophysiology Office Follow up Visit Note:     Date:  06/12/2023    ID:  Michael Decker, DOB 28-Nov-1933, MRN [id number]   PCP:  Eustaquio Boyden, MD    [facility] Cardiologist:  None  [facility] Electrophysiologist:  Lanier Prude, MD      Interval History:     Michael Decker is a 89 y.o. male who presents for a follow up visit for his permanent atrial fibrillation.  I last saw the patient October 27, 2020.  He is here today with his wife who I have previously met.  They remain very active.  He has no new symptoms.  He is tolerating his medical therapy without off target effects.    Presents for PPM implant today.         Past Medical History:  Diagnosis Date   Arthritis      knee   BPH (benign prostatic hypertrophy)      on avodart, released from urologist care   Colon polyps 2013    TA 2013 Marina Goodell)   Diverticulosis     GERD (gastroesophageal reflux disease)     Hematospermia      s/p uro eval   HLD (hyperlipidemia)      mild   HTN (hypertension)     Wears hearing aid in left ear                 Past Surgical History:  Procedure Laterality Date   BUNIONECTOMY Right 2014   CARDIOVERSION N/A 07/09/2020    Procedure: CARDIOVERSION;  Surgeon: Iran Ouch, MD;  Location: [facility] ORS;  Service: Cardiovascular;  Laterality: N/A;   CATARACT EXTRACTION W/PHACO Right 08/14/2018    Procedure: CATARACT EXTRACTION PHACO AND INTRAOCULAR LENS PLACEMENT (IOC)  RIGHT;  Surgeon: Lockie Mola, MD;  Location: [facility];  Service: Ophthalmology;  Laterality: Right;  requests arrival between 830 & 1030   CATARACT EXTRACTION W/PHACO Left 09/11/2018    Procedure: CATARACT EXTRACTION PHACO AND INTRAOCULAR LENS PLACEMENT (IOC)  LEFT;  Surgeon: Lockie Mola, MD;  Location: [facility];  Service: Ophthalmology;  Laterality: Left;  Doesn't want to be early   COLONOSCOPY   2008   COLONOSCOPY   01/2012    TA x1, diverticulosis, rpt 5 yrs Marina Goodell)    COLONOSCOPY   02/2017    TA, diverticulosis, int hem no f/u needed Marina Goodell)   CYSTECTOMY       TONSILLECTOMY   1944   UPPER GI ENDOSCOPY              Current Medications: Active Medications      Current Meds  Medication Sig   acetaminophen (TYLENOL) 650 MG CR tablet Take 1,300 mg by mouth at bedtime.   calcium carbonate (TUMS - DOSED IN MG ELEMENTAL CALCIUM) 500 MG chewable tablet Chew 1 tablet by mouth as needed for indigestion or heartburn.   carvedilol (COREG) 3.125 MG tablet Take 1 tablet (3.125 mg total) by mouth 2 (two) times daily.   Cyanocobalamin (B-12) 5000 MCG SUBL Place 5,000 mcg under the tongue as needed.   dutasteride (AVODART) 0.5 MG capsule TAKE 1 CAPSULE BY MOUTH EVERY OTHER DAY   ELIQUIS 5 MG TABS tablet TAKE 1 TABLET BY MOUTH TWICE DAILY   fluticasone (CUTIVATE) 0.005 % ointment Apply topically 2 (two) times daily as needed.   Glucosamine-Chondroitin (OSTEO BI-FLEX REGULAR STRENGTH PO) Take 1 tablet by mouth at bedtime.   Krill Oil 500 MG CAPS Take 1  capsule by mouth daily.    magnesium gluconate (MAGONATE) 500 MG tablet Take 500 mg by mouth daily.   MISC NATURAL PRODUCTS EX Apply topically. Cannabis cream   Multiple Vitamins-Minerals (MULTIVITAMIN WITH MINERALS) tablet Take 1 tablet by mouth daily.   potassium gluconate 595 (99 K) MG TABS tablet Take 595 mg by mouth at bedtime.   telmisartan (MICARDIS) 80 MG tablet TAKE 1 TABLET BY MOUTH EVERY DAY        Allergies:   Metoprolol    Social History         Socioeconomic History   Marital status: Married      Spouse name: Not on file   Number of children: Not on file   Years of education: Not on file   Highest education level: Not on file  Occupational History   Not on file  Tobacco Use   Smoking status: Former   Smokeless tobacco: Never   Tobacco comments:      cigars back in the 60's  Vaping Use   Vaping Use: Never used  Substance and Sexual Activity   Alcohol use: Yes      Comment:  occassionally-beer or wine   Drug use: No   Sexual activity: Yes      Partners: Female  Other Topics Concern   Not on file  Social History Narrative    Lives with wife    Grown children    Occupation: prior worked with Horticulturist, commercial as Public affairs consultant; volunteers at [employer] for humanity in [employer]    Edu: BS    Activity: works out at [employer] 3x/wk, on recumbent bike    Diet: some water, fruits/vegetables daily              Social Determinants of Community education officer Strain: Not on file  Food Insecurity: No Food Insecurity   Worried About Programme researcher, broadcasting/film/video in the Last Year: Never true   Barista in the Last Year: Never true  Transportation Needs: Not on file  Physical Activity: Not on file  Stress: Not on file  Social Connections: Not on file      Family History: The patient's family history includes Heart disease (age of onset: 7) in his brother; Sudden death (age of onset: 63) in his father. There is no history of Cancer, Diabetes, Stroke, Colon cancer, Esophageal cancer, or Stomach cancer.   ROS:   Please see the history of present illness.    All other systems reviewed and are negative.   EKGs/Labs/Other Studies Reviewed:     The following studies were reviewed today:     EKG:  The ekg ordered today demonstrates atrial fibrillation with a slow ventricular response   Recent Labs: 05/25/2020: ALT 19; TSH 1.05 07/07/2020: BUN 22; Creatinine, Ser 1.14; Hemoglobin 15.2; Platelets 209; Potassium 4.5; Sodium 137  Recent Lipid Panel Labs (Brief)          Component Value Date/Time    CHOL 159 05/25/2020 1245    TRIG 90.0 05/25/2020 1245    TRIG 205 06/17/2013 0000    HDL 65.90 05/25/2020 1245    CHOLHDL 2 05/25/2020 1245    VLDL 18.0 05/25/2020 1245    LDLCALC 76 05/25/2020 1245    LDLCALC 82 06/17/2013 0000        Physical Exam:     VS:  BP 167/68 -- Pulse 53   Ht 5\' 11"  (1.803 m)   Wt 181  lb (82.1 kg)   SpO2 97%   BMI 25.24 kg/m          Wt Readings from Last 3 Encounters:  02/09/21 181 lb (82.1 kg)  02/08/21 179 lb 8 oz (81.4 kg)  01/28/21 182 lb 1 oz (82.6 kg)      GEN:  Well nourished, well developed in no acute distress.  Appears younger than stated age HEENT: Normal NECK: No JVD; No carotid bruits LYMPHATICS: No lymphadenopathy CARDIAC: Irregularly irregular, no murmurs, rubs, gallops RESPIRATORY:  Clear to auscultation without rales, wheezing or rhonchi  ABDOMEN: Soft, non-tender, non-distended MUSCULOSKELETAL:  No edema; No deformity.  Orthopedic brace on left knee SKIN: Warm and dry NEUROLOGIC:  Alert and oriented x 3 PSYCHIATRIC:  Normal affect    ASSESSMENT:     1. Permanent atrial fibrillation (HCC)   2. Chronic systolic heart failure (HCC)     PLAN:     In order of problems listed above:   # Permanent atrial fibrillation (HCC) #Symptomatic bradycardia #Junctional rhythm. Persistent symptomatic bradycardia and chronotropic incompetence. I have discussed treatment options and he wishes to proceed with permanent pacemaker implant.  Risks, benefits, alternatives to PPM implantation were discussed in detail with the patient today. The patient understands that the risks include but are not limited to bleeding, infection, pneumothorax, perforation, tamponade, vascular damage, renal failure, MI, stroke, death, and lead dislodgement and wishes to proceed.  We will therefore schedule device implantation at the next available time.      Signed, Steffanie Dunn, MD, [facility], [facility] 06/12/2023   Electrophysiology [facility]

## 2023-06-13 ENCOUNTER — Encounter (HOSPITAL_COMMUNITY): Payer: Self-pay | Admitting: Cardiology

## 2023-06-25 ENCOUNTER — Encounter: Payer: PPO | Admitting: Cardiology

## 2023-06-25 NOTE — Patient Instructions (Signed)
   After Your Pacemaker   Monitor your pacemaker site for redness, swelling, and drainage. Call the device clinic at [phone] if you experience these symptoms or fever/chills.  Your incision was closed with Steri-strips or staples:  You may shower 7 days after your procedure and wash your incision with soap and water. Avoid lotions, ointments, or perfumes over your incision until it is well-healed.  You may use a hot tub or a pool after your wound check appointment if the incision is completely closed.  Do not lift, push or pull greater than 10 pounds with the affected arm until 6 weeks after your procedure. UNTIL AFTER DECEMBER 31ST. There are no other restrictions in arm movement after your wound check appointment.  You may drive, unless driving has been restricted by your healthcare providers.  Remote monitoring is used to monitor your pacemaker from home. This monitoring is scheduled every 91 days by our office. It allows [place] to keep an eye on the functioning of your device to ensure it is working properly. You will routinely see your Electrophysiologist annually (more often if necessary).

## 2023-06-27 ENCOUNTER — Ambulatory Visit: Payer: PPO | Attending: Internal Medicine

## 2023-06-27 DIAGNOSIS — I5022 Chronic systolic (congestive) heart failure: Secondary | ICD-10-CM

## 2023-06-28 NOTE — Progress Notes (Signed)
Device checked in clinic by industry rep.   Wound check appointment. Steri-strips removed. Wound without redness or edema. Incision edges approximated, wound well healed Patient educated about wound care, arm mobility, lifting restrictions. ROV in 3 months with implanting physician.

## 2023-07-06 ENCOUNTER — Encounter: Payer: PPO | Admitting: Cardiology

## 2023-07-10 ENCOUNTER — Ambulatory Visit: Payer: PPO | Admitting: Family Medicine

## 2023-07-10 ENCOUNTER — Encounter: Payer: Self-pay | Admitting: Family Medicine

## 2023-07-10 VITALS — BP 136/78 | HR 69 | Temp 97.7°F | Ht 71.0 in | Wt 176.2 lb

## 2023-07-10 DIAGNOSIS — N401 Enlarged prostate with lower urinary tract symptoms: Secondary | ICD-10-CM

## 2023-07-10 DIAGNOSIS — I4821 Permanent atrial fibrillation: Secondary | ICD-10-CM

## 2023-07-10 DIAGNOSIS — R351 Nocturia: Secondary | ICD-10-CM

## 2023-07-10 DIAGNOSIS — Z950 Presence of cardiac pacemaker: Secondary | ICD-10-CM

## 2023-07-10 DIAGNOSIS — I10 Essential (primary) hypertension: Secondary | ICD-10-CM

## 2023-07-10 DIAGNOSIS — R0981 Nasal congestion: Secondary | ICD-10-CM

## 2023-07-10 NOTE — Assessment & Plan Note (Signed)
Continues dutasteride every other day.

## 2023-07-10 NOTE — Assessment & Plan Note (Signed)
Ongoing congestion without significant infective symptoms. Continue nasal saline, continue nasonex regularly.

## 2023-07-10 NOTE — Progress Notes (Signed)
Ph: [phone] Fax: [phone]   Patient ID: Michael Decker, male    DOB: [birth year], 89 y.o.   MRN: [id number]  This visit was conducted in person.  BP 136/78   Pulse 69   Temp 97.7 F (36.5 C) (Oral)   Ht 5\' 11"  (1.803 m)   Wt 176 lb 4 oz (79.9 kg)   SpO2 96%   BMI 24.58 kg/m    CC: 6 mo HTN f/u visit  Subjective:   HPI: Michael Decker is a 89 y.o. male presenting on 07/10/2023 for Medical Management of Chronic Issues (Here for 3 mo HTN f/u.)   See prior notes for details. Seen here 03/2023 with uncontrolled hypertension despite telmisartan 80mg  daily, started on amlodipine 2.5mg  daily.   Seen a month later (04/2023) with progressive bradycardia 40s - referred to cardiology dx junctional bradycardia in setting of permanent atrial fibrillation s/p pacemaker placement 06/12/2023. He's recovered significantly well after this and feels much better, quickly noted improved energy levels. He doesn't like sleeping on his back - can sleep on his right side as well - he's started sleeping on the couch.   Continues dutasteride every other day. This may have turned semen dark. Urology aware.   Nasal sinus congestion present for over a month. Colored mucous present as well. Had to stop zyrtec due to nasal dryness. He continues nasonex.  No sinus pressure headaches, fevers/chills, unilateral facial pain.      Relevant past medical, surgical, family and social history reviewed and updated as indicated. Interim medical history since our last visit reviewed. Allergies and medications reviewed and updated. Outpatient Medications Prior to Visit  Medication Sig Dispense Refill   acetaminophen (TYLENOL) 650 MG CR tablet Take 1,300 mg by mouth at bedtime.     amLODipine (NORVASC) 2.5 MG tablet Take 1 tablet (2.5 mg total) by mouth daily. 90 tablet 3   apixaban (ELIQUIS) 5 MG TABS tablet Take 1 tablet (5 mg total) by mouth 2 (two) times daily. 180 tablet 4   dutasteride (AVODART) 0.5 MG  capsule TAKE 1 CAPSULE BY MOUTH EVERY OTHER DAY 45 capsule 3   Glucosamine-Chondroitin (OSTEO BI-FLEX REGULAR STRENGTH PO) Take 250 mg by mouth 2 (two) times daily.     Magnesium Gluconate 250 MG TABS Take 250 mg by mouth daily.     MISC NATURAL PRODUCTS EX Apply 1 application  topically daily as needed (pain). Cannabis cream     mometasone (ALLERGY NASAL SPRAY) 50 MCG/ACT nasal spray Place 2 sprays into the nose every morning.     Multiple Vitamins-Minerals (MULTIVITAMIN WITH MINERALS) tablet Take 1 tablet by mouth daily.     Omega-3 Fatty Acids (FISH OIL) 1200 MG CAPS Take 1,200 mg by mouth daily. Krill oil     telmisartan (MICARDIS) 80 MG tablet Take 1 tablet (80 mg total) by mouth daily. 90 tablet 1   cetirizine (ZYRTEC) 10 MG tablet Take 10 mg by mouth daily.     No facility-administered medications prior to visit.     Per HPI unless specifically indicated in ROS section below Review of Systems  Objective:  BP 136/78   Pulse 69   Temp 97.7 F (36.5 C) (Oral)   Ht 5\' 11"  (1.803 m)   Wt 176 lb 4 oz (79.9 kg)   SpO2 96%   BMI 24.58 kg/m   Wt Readings from Last 3 Encounters:  07/10/23 176 lb 4 oz (79.9 kg)  06/12/23 173 lb (78.5 kg)  05/18/23  183 lb 3.2 oz (83.1 kg)      Physical Exam Vitals and nursing note reviewed.  Constitutional:      Appearance: Normal appearance. He is not ill-appearing.  HENT:     Head: Normocephalic and atraumatic.     Mouth/Throat:     Mouth: Mucous membranes are moist.     Pharynx: Oropharynx is clear. No oropharyngeal exudate or posterior oropharyngeal erythema.  Eyes:     Extraocular Movements: Extraocular movements intact.     Conjunctiva/sclera: Conjunctivae normal.     Pupils: Pupils are equal, round, and reactive to light.  Cardiovascular:     Rate and Rhythm: Normal rate and regular rhythm.     Pulses: Normal pulses.     Heart sounds: Normal heart sounds. No murmur heard. Pulmonary:     Effort: Pulmonary effort is normal. No  respiratory distress.     Breath sounds: Normal breath sounds. No wheezing, rhonchi or rales.  Musculoskeletal:     Right lower leg: No edema.     Left lower leg: No edema.  Skin:    General: Skin is warm and dry.     Findings: No rash.  Neurological:     Mental Status: He is alert.  Psychiatric:        Mood and Affect: Mood normal.        Behavior: Behavior normal.       Results for orders placed or performed in visit on 05/18/23  CBC   Collection Time: 05/18/23 11:11 AM  Result Value Ref Range   WBC 5.4 3.4 - 10.8 x10E3/uL   RBC 3.98 (L) 4.14 - 5.80 x10E6/uL   Hemoglobin 13.5 13.0 - 17.7 g/dL   Hematocrit 40.9 81.1 - 51.0 %   MCV 107 (H) 79 - 97 fL   MCH 33.9 (H) 26.6 - 33.0 pg   MCHC 31.8 31.5 - 35.7 g/dL   RDW 91.4 78.2 - 95.6 %   Platelets 164 150 - 450 x10E3/uL  Basic metabolic panel   Collection Time: 05/18/23 11:11 AM  Result Value Ref Range   Glucose 74 70 - 99 mg/dL   BUN 23 8 - 27 mg/dL   Creatinine, Ser 2.13 0.76 - 1.27 mg/dL   eGFR 59 (L) >08 MV/HQI/6.96   BUN/Creatinine Ratio 19 10 - 24   Sodium 136 134 - 144 mmol/L   Potassium 5.0 3.5 - 5.2 mmol/L   Chloride 98 96 - 106 mmol/L   CO2 24 20 - 29 mmol/L   Calcium 9.0 8.6 - 10.2 mg/dL    Assessment & Plan:   Problem List Items Addressed This Visit     Permanent atrial fibrillation (HCC) - Primary (Chronic)   Continues eliquis.       HTN (hypertension)   Chronic, improved- continue telmisartan and low dose amlodipine.       Benign prostatic hyperplasia   Continues dutasteride every other day.       Status post placement of cardiac pacemaker   Pacemaker placed 05/2023 for symptomatic bradycardia with significant improvement in symptoms.        Nasal sinus congestion   Ongoing congestion without significant infective symptoms. Continue nasal saline, continue nasonex regularly.         No orders of the defined types were placed in this encounter.   No orders of the defined types were  placed in this encounter.   Patient Instructions  Blood pressure is doing well I'm glad you've recovered well from pacemaker placement.  Ok to continue nasonex (2 sprays into the nose) daily, watching for nosebleeds. Start plain nasal saline irrigation as needed throughout the day. Let me know if fever or worsening one sided facial pain suggestive of acute sinus infection.   Follow up plan: Return in about 4 months (around 11/08/2023), or if symptoms worsen or fail to improve, for annual exam, prior fasting for blood work, medicare wellness visit.  Eustaquio Boyden, MD

## 2023-07-10 NOTE — Assessment & Plan Note (Signed)
Chronic, improved- continue telmisartan and low dose amlodipine.

## 2023-07-10 NOTE — Assessment & Plan Note (Signed)
Pacemaker placed 05/2023 for symptomatic bradycardia with significant improvement in symptoms.

## 2023-07-10 NOTE — Patient Instructions (Addendum)
Blood pressure is doing well I'm glad you've recovered well from pacemaker placement. Ok to continue nasonex (2 sprays into the nose) daily, watching for nosebleeds. Start plain nasal saline irrigation as needed throughout the day. Let me know if fever or worsening one sided facial pain suggestive of acute sinus infection.

## 2023-07-10 NOTE — Assessment & Plan Note (Signed)
Continues eliquis.  

## 2023-08-12 ENCOUNTER — Other Ambulatory Visit: Payer: Self-pay | Admitting: Family Medicine

## 2023-08-13 ENCOUNTER — Encounter: Payer: Self-pay | Admitting: Cardiology

## 2023-09-04 DIAGNOSIS — M461 Sacroiliitis, not elsewhere classified: Secondary | ICD-10-CM | POA: Diagnosis not present

## 2023-09-04 DIAGNOSIS — M47816 Spondylosis without myelopathy or radiculopathy, lumbar region: Secondary | ICD-10-CM | POA: Diagnosis not present

## 2023-09-06 DIAGNOSIS — J301 Allergic rhinitis due to pollen: Secondary | ICD-10-CM | POA: Diagnosis not present

## 2023-09-06 DIAGNOSIS — J329 Chronic sinusitis, unspecified: Secondary | ICD-10-CM | POA: Diagnosis not present

## 2023-09-06 DIAGNOSIS — J309 Allergic rhinitis, unspecified: Secondary | ICD-10-CM | POA: Diagnosis not present

## 2023-09-12 ENCOUNTER — Ambulatory Visit (INDEPENDENT_AMBULATORY_CARE_PROVIDER_SITE_OTHER): Payer: PPO

## 2023-09-12 DIAGNOSIS — I5022 Chronic systolic (congestive) heart failure: Secondary | ICD-10-CM | POA: Diagnosis not present

## 2023-09-13 LAB — CUP PACEART REMOTE DEVICE CHECK
Battery Remaining Longevity: 145 mo
Battery Voltage: 3.14 V
Brady Statistic RV Percent Paced: 99.82 %
Date Time Interrogation Session: 20250218222211
Implantable Lead Connection Status: 753985
Implantable Lead Implant Date: 20241119
Implantable Lead Location: 753860
Implantable Lead Model: 3830
Implantable Pulse Generator Implant Date: 20241119
Lead Channel Impedance Value: 342 Ohm
Lead Channel Impedance Value: 551 Ohm
Lead Channel Pacing Threshold Amplitude: 0.5 V
Lead Channel Pacing Threshold Pulse Width: 0.4 ms
Lead Channel Sensing Intrinsic Amplitude: 14.25 mV
Lead Channel Sensing Intrinsic Amplitude: 14.25 mV
Lead Channel Setting Pacing Amplitude: 3.5 V
Lead Channel Setting Pacing Pulse Width: 0.4 ms
Lead Channel Setting Sensing Sensitivity: 0.9 mV
Zone Setting Status: 755011

## 2023-09-13 NOTE — Progress Notes (Unsigned)
Electrophysiology Clinic Note    Date:  09/14/2023  Patient ID:  Michael Decker, Michael Decker Aug 07, 1933, MRN [id number] PCP:  Eustaquio Boyden, MD  Cardiologist:  None Electrophysiologist: Lanier Prude, MD   Discussed the use of AI scribe software for clinical note transcription with the patient, who gave verbal consent to proceed.   Patient Profile    Chief Complaint: PPM follow-up  History of Present Illness: Michael Decker is a 89 y.o. male with PMH notable for perm AFib, symptomatic bradycardia s/p PPM, HFimpEF, HTN, orthostatic hypotension; seen today for Lanier Prude, MD for routine electrophysiology followup.   He is s/p PPM implant 05/2023.   On follow-up today, he is doing very well. Denies chest pain, chest pressure, palpitations, dizziness, or LH. Back to exercising without limitations. He has noticed his resting HR is in the 80s, previously was in the low 60s and questions whether this is to be expected.   He continues to take eliquis 5mg  BID, no bleeding concerns.   His wife joins for appt, whom I have met previously    Device Information: MDT single chamber PPM, imp 05/2023, dx perm afib w bradycardia    ROS:  Please see the history of present illness. All other systems are reviewed and otherwise negative.    Physical Exam    VS:  BP 138/72 (BP Location: Left Arm, Patient Position: Sitting)   Pulse 68   Ht 5\' 11"  (1.803 m)   Wt 177 lb (80.3 kg)   SpO2 98%   BMI 24.69 kg/m  BMI: Body mass index is 24.69 kg/m.  Wt Readings from Last 3 Encounters:  09/14/23 177 lb (80.3 kg)  07/10/23 176 lb 4 oz (79.9 kg)  06/12/23 173 lb (78.5 kg)     GEN- The patient is well appearing, alert and oriented x 3 today.   Lungs- Clear to ausculation bilaterally, normal work of breathing.  Heart- Regular rate and rhythm, no murmurs, rubs or gallops Extremities- No peripheral edema, warm, dry Skin-  device pocket well-healed, no tethering   Device  interrogation done today and reviewed by myself:  Battery 12 years Lead threshold, impedence, sensing stable  High VP No episodes No changes made today   Studies Reviewed   Previous EP, cardiology notes.    EKG is ordered. Personal review of EKG from today shows:    EKG Interpretation Date/Time:  Friday September 14 2023 11:29:09 EST Ventricular Rate:  68 PR Interval:    QRS Duration:  142 QT Interval:  452 QTC Calculation: 480 R Axis:   -64  Text Interpretation: Ventricular-paced rhythm Confirmed by Sherie Don [id number]) on 09/14/2023 11:29:56 AM    TTE, 03/23/2022 1. Left ventricular ejection fraction, by estimation, is 60 to 65%. The left ventricle has normal function. The left ventricle has no regional wall motion abnormalities. Left ventricular diastolic parameters are indeterminate.   2. Right ventricular systolic function is normal. The right ventricular size is normal. Tricuspid regurgitation signal is inadequate for assessing PA pressure.   3. Left atrial size was mildly dilated.   4. The mitral valve is normal in structure. Mild mitral valve regurgitation. No evidence of mitral stenosis.   5. The aortic valve is normal in structure. Aortic valve regurgitation is not visualized. Aortic valve sclerosis is present, with no evidence of aortic valve stenosis.   6. The inferior vena cava is normal in size with greater than 50% respiratory variability, suggesting right atrial pressure of 3  mmHg.   TTE, 07/13/2020  1. Left ventricular ejection fraction, by estimation, is 45 to 50%. The left ventricle has mildly decreased function. Left ventricular endocardial border not optimally defined to evaluate regional wall motion. Left ventricular diastolic parameters are  indeterminate.   2. Right ventricular systolic function is normal. The right ventricular size is mildly enlarged. Tricuspid regurgitation signal is inadequate for assessing PA pressure.   3. Left atrial size was mildly  dilated.   4. Right atrial size was moderately dilated.   5. The mitral valve is normal in structure. Mild to moderate mitral valve regurgitation. No evidence of mitral stenosis.   6. The aortic valve is normal in structure. Aortic valve regurgitation is not visualized. Mild to moderate aortic valve sclerosis/calcification is present, without any evidence of aortic stenosis.   7. Aortic dilatation noted. There is mild dilatation of the ascending aorta, measuring 40 mm.   8. The inferior vena cava is dilated in size with >50% respiratory variability, suggesting right atrial pressure of 8 mmHg.      Assessment and Plan     #) symptomatic bradycardia s/p PPM #) perm afib S/p PPM implant 05/2023 Narrow QRS on today's EKG Good HR histograms Continue remove monitoring every 3 mon and yearly in-person device checks, sooner if needed  #) Hypercoag d/t perm afib CHA2DS2-VASc Score = at least 4 [CHF History: 1, HTN History: 1, Diabetes History: 0, Stroke History: 0, Vascular Disease History: 0, Age Score: 2, Gender Score: 0].  Therefore, the patient's annual risk of stroke is 4.8 %.    Stroke ppx - 5mg  eliquis BID, appropriately dosed No bleeding concerns        Current medicines are reviewed at length with the patient today.   The patient does not have concerns regarding his medicines.  The following changes were made today:  none  Labs/ tests ordered today include:  Orders Placed This Encounter  Procedures   EKG 12-Lead     Disposition: Follow up with Dr. Lalla Brothers or EP APP in 12 months in person   Signed, Sherie Don, NP  09/14/23  12:01 PM  Electrophysiology [facility]

## 2023-09-14 ENCOUNTER — Encounter: Payer: PPO | Admitting: Physician Assistant

## 2023-09-14 ENCOUNTER — Encounter: Payer: Self-pay | Admitting: Cardiology

## 2023-09-14 ENCOUNTER — Ambulatory Visit: Payer: PPO | Attending: Cardiology | Admitting: Cardiology

## 2023-09-14 VITALS — BP 138/72 | HR 68 | Ht 71.0 in | Wt 177.0 lb

## 2023-09-14 DIAGNOSIS — I4821 Permanent atrial fibrillation: Secondary | ICD-10-CM | POA: Diagnosis not present

## 2023-09-14 DIAGNOSIS — Z950 Presence of cardiac pacemaker: Secondary | ICD-10-CM | POA: Diagnosis not present

## 2023-09-14 DIAGNOSIS — M461 Sacroiliitis, not elsewhere classified: Secondary | ICD-10-CM | POA: Diagnosis not present

## 2023-09-14 DIAGNOSIS — R001 Bradycardia, unspecified: Secondary | ICD-10-CM

## 2023-09-14 DIAGNOSIS — D6869 Other thrombophilia: Secondary | ICD-10-CM

## 2023-09-14 LAB — CUP PACEART INCLINIC DEVICE CHECK
Date Time Interrogation Session: 20250221120635
Implantable Lead Connection Status: 753985
Implantable Lead Implant Date: 20241119
Implantable Lead Location: 753860
Implantable Lead Model: 3830
Implantable Pulse Generator Implant Date: 20241119

## 2023-09-14 NOTE — Patient Instructions (Signed)
 Medication Instructions:  The current medical regimen is effective;  continue present plan and medications.   *If you need a refill on your cardiac medications before your next appointment, please call your pharmacy*   Follow-Up: At [facility], you and your health needs are our priority.  As part of our continuing mission to provide you with exceptional heart care, we have created designated Provider Care Teams.  These Care Teams include your primary Cardiologist (physician) and Advanced Practice Providers (APPs -  Physician Assistants and Nurse Practitioners) who all work together to provide you with the care you need, when you need it.  We recommend signing up for the patient portal called "MyChart".  Sign up information is provided on this After Visit Summary.  MyChart is used to connect with patients for Virtual Visits (Telemedicine).  Patients are able to view lab/test results, encounter notes, upcoming appointments, etc.  Non-urgent messages can be sent to your provider as well.   To learn more about what you can do with MyChart, go to [email or web address].    Your next appointment:   12 month(s)  Provider:   Steffanie Dunn, MD or Sherie Don, NP

## 2023-09-18 ENCOUNTER — Encounter: Payer: Self-pay | Admitting: Cardiology

## 2023-09-29 ENCOUNTER — Other Ambulatory Visit: Payer: Self-pay | Admitting: Family Medicine

## 2023-09-29 DIAGNOSIS — R351 Nocturia: Secondary | ICD-10-CM

## 2023-10-12 DIAGNOSIS — M47816 Spondylosis without myelopathy or radiculopathy, lumbar region: Secondary | ICD-10-CM | POA: Diagnosis not present

## 2023-10-12 DIAGNOSIS — M461 Sacroiliitis, not elsewhere classified: Secondary | ICD-10-CM | POA: Diagnosis not present

## 2023-10-12 DIAGNOSIS — M4807 Spinal stenosis, lumbosacral region: Secondary | ICD-10-CM | POA: Diagnosis not present

## 2023-10-16 ENCOUNTER — Other Ambulatory Visit: Payer: Self-pay | Admitting: Family Medicine

## 2023-10-16 DIAGNOSIS — M5416 Radiculopathy, lumbar region: Secondary | ICD-10-CM

## 2023-10-17 NOTE — Progress Notes (Signed)
 Remote pacemaker transmission.

## 2023-10-17 NOTE — Addendum Note (Signed)
 Addended by: Elease Etienne A on: 10/17/2023 11:05 AM   Modules accepted: Orders

## 2023-10-23 ENCOUNTER — Ambulatory Visit
Admission: RE | Admit: 2023-10-23 | Discharge: 2023-10-23 | Disposition: A | Discharge status: Home / Self Care | Source: Ambulatory Visit | Attending: Family Medicine | Admitting: Family Medicine

## 2023-10-23 DIAGNOSIS — M5416 Radiculopathy, lumbar region: Secondary | ICD-10-CM

## 2023-10-23 DIAGNOSIS — M4316 Spondylolisthesis, lumbar region: Secondary | ICD-10-CM | POA: Diagnosis not present

## 2023-11-02 ENCOUNTER — Telehealth: Payer: Self-pay | Admitting: Family Medicine

## 2023-11-02 ENCOUNTER — Ambulatory Visit (INDEPENDENT_AMBULATORY_CARE_PROVIDER_SITE_OTHER): Payer: PPO

## 2023-11-02 VITALS — BP 126/78 | Ht 71.0 in | Wt 179.2 lb

## 2023-11-02 DIAGNOSIS — Z0000 Encounter for general adult medical examination without abnormal findings: Secondary | ICD-10-CM | POA: Diagnosis not present

## 2023-11-02 NOTE — Patient Instructions (Signed)
 Michael Decker , Thank you for taking time to come for your Medicare Wellness Visit. I appreciate your ongoing commitment to your health goals. Please review the following plan we discussed and let me know if I can assist you in the future.   Referrals/Orders/Follow-Ups/Clinician Recommendations: none  This is a list of the screening recommended for you and due dates:  Health Maintenance  Topic Date Due   Zoster (Shingles) Vaccine (1 of 2) 04/04/1986   Colon Cancer Screening  03/13/2022   COVID-19 Vaccine (4 - 2024-25 season) 03/25/2023   Flu Shot  02/22/2024   Medicare Annual Wellness Visit  11/01/2024   DTaP/Tdap/Td vaccine (3 - Td or Tdap) 01/23/2031   Pneumonia Vaccine  Completed   HPV Vaccine  Aged Out   Meningitis B Vaccine  Aged Out    Advanced directives: (In Chart) A copy of your advanced directives are scanned into your chart should your provider ever need it.  Next Medicare Annual Wellness Visit scheduled for next year: Yes 11/05/23 @ 8:10am in person

## 2023-11-02 NOTE — Progress Notes (Signed)
 Subjective:   Michael Decker is a 89 y.o. who presents for a Medicare Wellness preventive visit.  Visit Complete: In person  Persons Participating in Visit: Patient.  AWV Questionnaire: Yes: Patient Medicare AWV questionnaire was completed by the patient on 10/31/23; I have confirmed that all information answered by patient is correct and no changes since this date.  Cardiac Risk Factors include: advanced age (>15men, >10 women);dyslipidemia;male gender;hypertension     Objective:    Today's Vitals   11/02/23 0811  BP: 126/78  Weight: 179 lb 3.2 oz (81.3 kg)  Height: 5\' 11"  (1.803 m)   Body mass index is 24.99 kg/m.     11/02/2023    8:19 AM 06/12/2023   10:40 AM 01/22/2021    4:55 PM 07/09/2020    7:13 AM 12/19/2019    8:59 AM 09/11/2018   10:46 AM 08/14/2018   10:24 AM  Advanced Directives  Does Patient Have a Medical Advance Directive? Yes Yes No Yes Yes Yes Yes  Type of Estate agent of [place];Living will Healthcare Power of [place];Living will  Living will;Healthcare Power of Attorney Living will Healthcare Power of [place];Living will Healthcare Power of [place];Living will  Does patient want to make changes to medical advance directive?  No - Patient declined  No - Patient declined  No - Patient declined No - Patient declined  Copy of Healthcare Power of Attorney in Chart? Yes - validated most recent copy scanned in chart (See row information) No - copy requested  Yes - validated most recent copy scanned in chart (See row information)  No - copy requested No - copy requested    Current Medications (verified) Outpatient Encounter Medications as of 11/02/2023  Medication Sig   acetaminophen (TYLENOL) 650 MG CR tablet Take 1,300 mg by mouth at bedtime.   amLODipine (NORVASC) 2.5 MG tablet Take 1 tablet (2.5 mg total) by mouth daily.   apixaban (ELIQUIS) 5 MG TABS tablet Take 1 tablet (5 mg total) by mouth 2 (two) times daily.   dutasteride  (AVODART) 0.5 MG capsule TAKE 1 CAPSULE BY MOUTH EVERY OTHER DAY   Glucosamine-Chondroitin (OSTEO BI-FLEX REGULAR STRENGTH PO) Take 250 mg by mouth 2 (two) times daily.   Magnesium Gluconate 250 MG TABS Take 250 mg by mouth daily.   MISC NATURAL PRODUCTS EX Apply 1 application  topically daily as needed (pain). Cannabis cream   Multiple Vitamins-Minerals (MULTIVITAMIN WITH MINERALS) tablet Take 1 tablet by mouth daily.   Omega-3 Fatty Acids (FISH OIL) 1200 MG CAPS Take 1,200 mg by mouth daily. Krill oil   telmisartan (MICARDIS) 80 MG tablet TAKE 1 TABLET BY MOUTH EVERY DAY   No facility-administered encounter medications on file as of 11/02/2023.    Allergies (verified) Metoprolol and Oxycodone   History: Past Medical History:  Diagnosis Date   Allergy a long time ago   it is what it is   Arthritis    knee   BPH (benign prostatic hypertrophy)    on avodart, released from urologist care   Cataract 2 years ago   they are fine   Colon polyps 2013   TA 2013 Marina Goodell)   Diverticulosis    GERD (gastroesophageal reflux disease)    Hematospermia    s/p uro eval   HLD (hyperlipidemia)    mild   HTN (hypertension)    Wears hearing aid in left ear    Past Surgical History:  Procedure Laterality Date   BUNIONECTOMY Right 2014  CARDIOVERSION N/A 07/09/2020   Procedure: CARDIOVERSION;  Surgeon: Iran Ouch, MD;  Location: [facility];  Service: Cardiovascular;  Laterality: N/A;   CATARACT EXTRACTION W/PHACO Right 08/14/2018   Procedure: CATARACT EXTRACTION PHACO AND INTRAOCULAR LENS PLACEMENT (IOC)  RIGHT;  Surgeon: Lockie Mola, MD;  Location: [facility];  Service: Ophthalmology;  Laterality: Right;  requests arrival between 830 & 1030   CATARACT EXTRACTION W/PHACO Left 09/11/2018   Procedure: CATARACT EXTRACTION PHACO AND INTRAOCULAR LENS PLACEMENT (IOC)  LEFT;  Surgeon: Lockie Mola, MD;  Location: [facility];  Service: Ophthalmology;  Laterality:  Left;  Doesn't want to be early   COLONOSCOPY  2008   COLONOSCOPY  01/2012   TA x1, diverticulosis, rpt 5 yrs Marina Goodell)   COLONOSCOPY  02/2017   TA, diverticulosis, int hem no f/u needed Marina Goodell)   CYSTECTOMY     PACEMAKER IMPLANT N/A 06/12/2023   Procedure: PACEMAKER IMPLANT;  Surgeon: Lanier Prude, MD;  Location: [facility];  Service: Cardiovascular;  Laterality: N/A;   TONSILLECTOMY  1944   UPPER GI ENDOSCOPY     Family History  Problem Relation Age of Onset   Sudden death Father 31       ?MI   Heart disease Brother 73       ?afib   Cancer Neg Hx    Diabetes Neg Hx    Stroke Neg Hx    Colon cancer Neg Hx    Esophageal cancer Neg Hx    Stomach cancer Neg Hx    Social History   Socioeconomic History   Marital status: Married    Spouse name: Not on file   Number of children: Not on file   Years of education: Not on file   Highest education level: Bachelor's degree (e.g., BA, AB, BS)  Occupational History   Not on file  Tobacco Use   Smoking status: Former   Smokeless tobacco: Never   Tobacco comments:    cigars back in the 60's  Vaping Use   Vaping status: Never Used  Substance and Sexual Activity   Alcohol use: Not Currently    Comment: occassionally-beer or wine   Drug use: No   Sexual activity: Yes    Partners: Female    Birth control/protection: None    Comment: old age  Other Topics Concern   Not on file  Social History Narrative   Lives with wife   Grown children   Occupation: prior worked with Horticulturist, commercial as Public affairs consultant; volunteers at [employer] for humanity in [place]   Edu: BS   Activity: works out at [employer] 3x/wk, on recumbent bike   Diet: some water, fruits/vegetables daily         Social Drivers of Corporate investment banker Strain: Low Risk  (10/31/2023)   Overall Financial Resource Strain (CARDIA)    Difficulty of Paying Living Expenses: Not hard at all  Food Insecurity: No Food Insecurity (10/31/2023)   Hunger Vital Sign     Worried About Running Out of Food in the Last Year: Never true    Ran Out of Food in the Last Year: Never true  Transportation Needs: No Transportation Needs (10/31/2023)   PRAPARE - Administrator, Civil Service (Medical): No    Lack of Transportation (Non-Medical): No  Physical Activity: Sufficiently Active (10/31/2023)   Exercise Vital Sign    Days of Exercise per Week: 3 days    Minutes of Exercise per Session: 60  min  Stress: No Stress Concern Present (10/31/2023)   [employer] of Occupational Health - Occupational Stress Questionnaire    Feeling of Stress : Not at all  Social Connections: Socially Integrated (10/31/2023)   Social Connection and Isolation Panel [NHANES]    Frequency of Communication with Friends and Family: Once a week    Frequency of Social Gatherings with Friends and Family: Three times a week    Attends Religious Services: 1 to 4 times per year    Active Member of Clubs or Organizations: Yes    Attends Banker Meetings: 1 to 4 times per year    Marital Status: Married    Tobacco Counseling Counseling given: Not Answered Tobacco comments: cigars back in the 60's    Clinical Intake:  Pre-visit preparation completed: Yes  Pain : No/denies pain     BMI - recorded: 24.99 Nutritional Status: BMI of 19-24  Normal Nutritional Risks: None Diabetes: No  No results found for: "HGBA1C"   How often do you need to have someone help you when you read instructions, pamphlets, or other written materials from your doctor or pharmacy?: 1 - Never  Interpreter Needed?: No  Comments: lives with wife Information entered by :: B.Clorine Swing,LPN   Activities of Daily Living     11/02/2023    8:19 AM  In your present state of health, do you have any difficulty performing the following activities:  Hearing? 0  Vision? 0  Difficulty concentrating or making decisions? 0  Walking or climbing stairs? 0  Dressing or bathing? 0  Doing errands,  shopping? 0  Preparing Food and eating ? N  Using the Toilet? N  In the past six months, have you accidently leaked urine? N  Do you have problems with loss of bowel control? N  Managing your Medications? N  Managing your Finances? N  Housekeeping or managing your Housekeeping? N    Patient Care Team: Eustaquio Boyden, MD as PCP - General (Family Medicine) Lanier Prude, MD as PCP - Electrophysiology (Cardiology) Cherlyn Roberts, MD as Consulting Physician (Dermatology) Heloise Purpura, MD as Consulting Physician (Urology) Troxler, Blanchie Serve (Inactive) as Attending Physician (Podiatry) Lockie Mola, MD as Referring Physician (Ophthalmology) Hilarie Fredrickson, MD as Consulting Physician (Gastroenterology) Kathyrn Sheriff, [facility] (Inactive) as Pharmacist (Pharmacist)  Indicate any recent Medical Services you may have received from other than [facility] providers in the past year (date may be approximate).     Assessment:   This is a routine wellness examination for Brodie.  Hearing/Vision screen Hearing Screening - Comments:: Pt says his hearing is not good; has hearing aids Vision Screening - Comments:: Pt says he is farsighted:has glasses for reading Dr Inez Pilgrim   Goals Addressed             This Visit's Progress    Maintain physical activity   On track    11/02/23- I will continue to exercise 90 min 3 days per week.      Patient Stated   On track    11/02/23-Maintain current health.      Track and Manage My Blood Pressure-Hypertension   On track    Timeframe:  Long-Range Goal Priority:  Medium Start Date:     10/18/21                        Expected End Date:       10/19/22  Follow Up Date Sept 2023   - check blood pressure daily - choose a place to take my blood pressure (home, clinic or office, retail store) - write blood pressure results in a log or diary    Why is this important?   You won't feel high blood pressure, but it can still  hurt your blood vessels.  High blood pressure can cause heart or kidney problems. It can also cause a stroke.  Making lifestyle changes like losing a little weight or eating less salt will help.  Checking your blood pressure at home and at different times of the day can help to control blood pressure.  If the doctor prescribes medicine remember to take it the way the doctor ordered.  Call the office if you cannot afford the medicine or if there are questions about it.     Notes:        Depression Screen     11/02/2023    8:16 AM 07/10/2023    3:35 PM 04/09/2023   11:05 AM 11/01/2022    4:46 PM 09/27/2022    2:41 PM 10/25/2021    3:02 PM 07/14/2020    3:34 PM  PHQ 2/9 Scores  PHQ - 2 Score 0 0 0 0 0 0 0    Fall Risk     11/02/2023    8:14 AM 07/10/2023    3:35 PM 04/09/2023   11:05 AM 11/01/2022    4:46 PM 09/27/2022    2:41 PM  Fall Risk   Falls in the past year? 0 0 1 0 0  Number falls in past yr: 0  0  0  Injury with Fall? 0  0  0  Risk for fall due to : No Fall Risks    No Fall Risks  Follow up Education provided;Falls prevention discussed    Falls evaluation completed    MEDICARE RISK AT HOME:  Medicare Risk at Home Any stairs in or around the home?: No If so, are there any without handrails?: No Home free of loose throw rugs in walkways, pet beds, electrical cords, etc?: Yes Adequate lighting in your home to reduce risk of falls?: Yes Life alert?: No Use of a cane, walker or w/c?: No Grab bars in the bathroom?: Yes Shower chair or bench in shower?: No Elevated toilet seat or a handicapped toilet?: Yes  TIMED UP AND GO:  Was the test performed?  Yes  Length of time to ambulate 10 feet: 12 sec Gait steady and fast without use of assistive device  Cognitive Function: 6CIT completed    06/29/2017    1:20 PM 06/28/2016    8:30 AM  MMSE - Mini Mental State Exam  Orientation to time 5 5  Orientation to Place 5 5  Registration 3 3  Attention/ Calculation 5 0   Recall 3 2  Recall-comments  pt was unable to recall 1 of 3 words  Language- name 2 objects 2 0  Language- repeat 1 1  Language- follow 3 step command 3 3  Language- read & follow direction 1 0  Write a sentence 1 0  Copy design 1 0  Total score 30 19        11/02/2023    8:26 AM  6CIT Screen  What Year? 0 points  What month? 0 points  What time? 0 points  Count back from 20 0 points  Months in reverse 0 points  Repeat phrase 2 points  Total Score 2 points  Immunizations Immunization History  Administered Date(s) Administered   Fluad Quad(high Dose 65+) 04/08/2019, 05/09/2022   Fluad Trivalent(High Dose 65+) 05/17/2023   Influenza Inj Mdck Quad Pf 05/02/2018   Influenza, High Dose Seasonal PF 05/04/2017, 05/14/2020   Influenza,inj,Quad PF,6+ Mos 04/29/2014, 05/14/2015, 05/02/2016   Influenza-Unspecified 05/09/2013, 05/05/2021, 05/09/2022   Moderna Sars-Covid-2 Vaccination 09/09/2019, 10/07/2019, 05/22/2020   Pneumococcal Conjugate-13 06/23/2014   Pneumococcal Polysaccharide-23 03/24/2001, 06/28/2016   Tdap 05/11/2009, 01/22/2021   Zoster, Live 06/12/2012    Screening Tests Health Maintenance  Topic Date Due   Zoster Vaccines- Shingrix (1 of 2) 04/04/1986   Colonoscopy  03/13/2022   COVID-19 Vaccine (4 - 2024-25 season) 03/25/2023   INFLUENZA VACCINE  02/22/2024   Medicare Annual Wellness (AWV)  11/01/2024   DTaP/Tdap/Td (3 - Td or Tdap) 01/23/2031   Pneumonia Vaccine 78+ Years old  Completed   HPV VACCINES  Aged Out   Meningococcal B Vaccine  Aged Out    Health Maintenance  Health Maintenance Due  Topic Date Due   Zoster Vaccines- Shingrix (1 of 2) 04/04/1986   Colonoscopy  03/13/2022   COVID-19 Vaccine (4 - 2024-25 season) 03/25/2023   Health Maintenance Items Addressed: Pt says he is scheduled for Covid vaccine today  Additional Screening:  Vision Screening: Recommended annual ophthalmology exams for early detection of glaucoma and other  disorders of the eye.  Dental Screening: Recommended annual dental exams for proper oral hygiene  Community Resource Referral / Chronic Care Management: CRR required this visit?  No   CCM required this visit?  No     Plan:     I have personally reviewed and noted the following in the patient's chart:   Medical and social history Use of alcohol, tobacco or illicit drugs  Current medications and supplements including opioid prescriptions. Patient is not currently taking opioid prescriptions. Functional ability and status Nutritional status Physical activity Advanced directives List of other physicians Hospitalizations, surgeries, and ER visits in previous 12 months Vitals Screenings to include cognitive, depression, and falls Referrals and appointments  In addition, I have reviewed and discussed with patient certain preventive protocols, quality metrics, and best practice recommendations. A written personalized care plan for preventive services as well as general preventive health recommendations were provided to patient.    Sue Lush, LPN   1/61/0960   After Visit Summary: (MyChart) Due to this being a telephonic visit, the after visit summary with patients personalized plan was offered to patient via MyChart   Notes: Nothing significant to report at this time.

## 2023-11-02 NOTE — Telephone Encounter (Deleted)
 Pt brought by BP log for Dr. Reece Agar to review. Log is in Dr. Timoteo Expose folder. Call back # [phone].

## 2023-11-05 ENCOUNTER — Other Ambulatory Visit: Payer: Self-pay | Admitting: Family Medicine

## 2023-11-05 DIAGNOSIS — E7800 Pure hypercholesterolemia, unspecified: Secondary | ICD-10-CM

## 2023-11-05 DIAGNOSIS — I4821 Permanent atrial fibrillation: Secondary | ICD-10-CM

## 2023-11-05 DIAGNOSIS — E538 Deficiency of other specified B group vitamins: Secondary | ICD-10-CM

## 2023-11-05 DIAGNOSIS — N1831 Chronic kidney disease, stage 3a: Secondary | ICD-10-CM

## 2023-11-06 ENCOUNTER — Other Ambulatory Visit (INDEPENDENT_AMBULATORY_CARE_PROVIDER_SITE_OTHER): Payer: PPO

## 2023-11-06 DIAGNOSIS — E538 Deficiency of other specified B group vitamins: Secondary | ICD-10-CM

## 2023-11-06 DIAGNOSIS — N1831 Chronic kidney disease, stage 3a: Secondary | ICD-10-CM | POA: Diagnosis not present

## 2023-11-06 DIAGNOSIS — E7800 Pure hypercholesterolemia, unspecified: Secondary | ICD-10-CM

## 2023-11-06 DIAGNOSIS — I4821 Permanent atrial fibrillation: Secondary | ICD-10-CM | POA: Diagnosis not present

## 2023-11-06 LAB — COMPREHENSIVE METABOLIC PANEL WITH GFR
ALT: 17 U/L (ref 0–53)
AST: 23 U/L (ref 0–37)
Albumin: 3.9 g/dL (ref 3.5–5.2)
Alkaline Phosphatase: 67 U/L (ref 39–117)
BUN: 23 mg/dL (ref 6–23)
CO2: 30 meq/L (ref 19–32)
Calcium: 8.8 mg/dL (ref 8.4–10.5)
Chloride: 101 meq/L (ref 96–112)
Creatinine, Ser: 1.2 mg/dL (ref 0.40–1.50)
GFR: 54.34 mL/min — ABNORMAL LOW (ref >60.00)
Glucose, Bld: 118 mg/dL — ABNORMAL HIGH (ref 70–99)
Potassium: 4.2 meq/L (ref 3.5–5.1)
Sodium: 136 meq/L (ref 135–145)
Total Bilirubin: 0.6 mg/dL (ref 0.2–1.2)
Total Protein: 6.1 g/dL (ref 6.0–8.3)

## 2023-11-06 LAB — LIPID PANEL
Cholesterol: 143 mg/dL (ref 0–200)
HDL: 60.1 mg/dL (ref >39.00)
LDL Cholesterol: 66 mg/dL (ref 0–99)
NonHDL: 82.49
Total CHOL/HDL Ratio: 2
Triglycerides: 80 mg/dL (ref 0.0–149.0)
VLDL: 16 mg/dL (ref 0.0–40.0)

## 2023-11-06 LAB — CBC WITH DIFFERENTIAL/PLATELET
Basophils Absolute: 0 10*3/uL (ref 0.0–0.1)
Basophils Relative: 1 % (ref 0.0–3.0)
Eosinophils Absolute: 0.2 10*3/uL (ref 0.0–0.7)
Eosinophils Relative: 5.5 % — ABNORMAL HIGH (ref 0.0–5.0)
HCT: 43.8 % (ref 39.0–52.0)
Hemoglobin: 14.7 g/dL (ref 13.0–17.0)
Lymphocytes Relative: 32.9 % (ref 12.0–46.0)
Lymphs Abs: 1.2 10*3/uL (ref 0.7–4.0)
MCHC: 33.6 g/dL (ref 30.0–36.0)
MCV: 103.2 fl — ABNORMAL HIGH (ref 78.0–100.0)
Monocytes Absolute: 0.3 10*3/uL (ref 0.1–1.0)
Monocytes Relative: 7.6 % (ref 3.0–12.0)
Neutro Abs: 2 10*3/uL (ref 1.4–7.7)
Neutrophils Relative %: 53 % (ref 43.0–77.0)
Platelets: 164 10*3/uL (ref 150.0–400.0)
RBC: 4.25 Mil/uL (ref 4.22–5.81)
RDW: 13.4 % (ref 11.5–15.5)
WBC: 3.8 10*3/uL — ABNORMAL LOW (ref 4.0–10.5)

## 2023-11-06 LAB — MICROALBUMIN / CREATININE URINE RATIO
Creatinine,U: 69 mg/dL
Microalb Creat Ratio: UNDETERMINED mg/g (ref 0.0–30.0)
Microalb, Ur: <0.7 mg/dL

## 2023-11-06 LAB — PHOSPHORUS: Phosphorus: 3.2 mg/dL (ref 2.3–4.6)

## 2023-11-06 LAB — VITAMIN B12: Vitamin B-12: 383 pg/mL (ref 211–911)

## 2023-11-06 LAB — VITAMIN D 25 HYDROXY (VIT D DEFICIENCY, FRACTURES): VITD: 52.24 ng/mL (ref 30.00–100.00)

## 2023-11-07 LAB — PARATHYROID HORMONE, INTACT (NO CA): PTH: 21 pg/mL (ref 16–77)

## 2023-11-13 ENCOUNTER — Encounter: Payer: Self-pay | Admitting: Family Medicine

## 2023-11-13 ENCOUNTER — Ambulatory Visit (INDEPENDENT_AMBULATORY_CARE_PROVIDER_SITE_OTHER): Payer: PPO | Admitting: Family Medicine

## 2023-11-13 VITALS — BP 140/70 | HR 76 | Temp 97.8°F | Ht 70.75 in | Wt 178.2 lb

## 2023-11-13 DIAGNOSIS — Z0000 Encounter for general adult medical examination without abnormal findings: Secondary | ICD-10-CM

## 2023-11-13 DIAGNOSIS — I10 Essential (primary) hypertension: Secondary | ICD-10-CM

## 2023-11-13 DIAGNOSIS — R351 Nocturia: Secondary | ICD-10-CM | POA: Diagnosis not present

## 2023-11-13 DIAGNOSIS — N1831 Chronic kidney disease, stage 3a: Secondary | ICD-10-CM | POA: Diagnosis not present

## 2023-11-13 DIAGNOSIS — G8929 Other chronic pain: Secondary | ICD-10-CM | POA: Diagnosis not present

## 2023-11-13 DIAGNOSIS — Z950 Presence of cardiac pacemaker: Secondary | ICD-10-CM

## 2023-11-13 DIAGNOSIS — N401 Enlarged prostate with lower urinary tract symptoms: Secondary | ICD-10-CM

## 2023-11-13 DIAGNOSIS — E7800 Pure hypercholesterolemia, unspecified: Secondary | ICD-10-CM

## 2023-11-13 DIAGNOSIS — Z7189 Other specified counseling: Secondary | ICD-10-CM | POA: Diagnosis not present

## 2023-11-13 DIAGNOSIS — Z66 Do not resuscitate: Secondary | ICD-10-CM | POA: Diagnosis not present

## 2023-11-13 DIAGNOSIS — I4821 Permanent atrial fibrillation: Secondary | ICD-10-CM

## 2023-11-13 DIAGNOSIS — M545 Low back pain, unspecified: Secondary | ICD-10-CM | POA: Diagnosis not present

## 2023-11-13 MED ORDER — VITAMIN B-12 1000 MCG PO TABS: 1000.0000 ug | ORAL_TABLET | Freq: Every day | ORAL | Status: DC

## 2023-11-13 MED ORDER — DUTASTERIDE 0.5 MG PO CAPS: ORAL_CAPSULE | ORAL | 4 refills | Status: AC

## 2023-11-13 MED ORDER — TELMISARTAN 80 MG PO TABS: 80.0000 mg | ORAL_TABLET | Freq: Every day | ORAL | 4 refills | Status: AC

## 2023-11-13 MED ORDER — AMLODIPINE BESYLATE 2.5 MG PO TABS: 2.5000 mg | ORAL_TABLET | Freq: Every day | ORAL | 4 refills | Status: AC

## 2023-11-13 MED ORDER — APIXABAN 5 MG PO TABS: 5.0000 mg | ORAL_TABLET | Freq: Two times a day (BID) | ORAL | 4 refills | Status: AC

## 2023-11-13 NOTE — Assessment & Plan Note (Signed)
 DNR confirmed. Has form at home.

## 2023-11-13 NOTE — Assessment & Plan Note (Signed)
 S/p PPM placement 05/2023 - has done well after this.

## 2023-11-13 NOTE — Progress Notes (Signed)
 Ph: [phone] Fax: [phone]   Patient ID: Michael Decker, male    DOB: 12-31-1933, 89 y.o.   MRN: [id number]  This visit was conducted in person.  BP (!) 140/70   Pulse 76   Temp 97.8 F (36.6 C) (Oral)   Ht 5' 10.75" (1.797 m)   Wt 178 lb 4 oz (80.9 kg)   SpO2 96%   BMI 25.04 kg/m    CC: CPE Subjective:   HPI: Michael Decker is a 89 y.o. male presenting on 11/13/2023 for Annual Exam (MCR prt 2 [AWV- 11/02/23]. Pt brought in home BP monitor to compare. Reading in office today- 130 75. )   Saw health advisor 4/11 for medicare wellness visit. Note reviewed.   No results found.  Flowsheet Row Office Visit from 11/13/2023 in [facility] [place]  PHQ-2 Total Score 0          11/13/2023    9:35 AM 11/02/2023    8:14 AM 07/10/2023    3:35 PM 04/09/2023   11:05 AM 11/01/2022    4:46 PM  Fall Risk   Falls in the past year? 0 0 0 1 0  Number falls in past yr:  0  0   Injury with Fall?  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 Continues eliquis  5mg  BID for stroke prevention.   Saw urology s/p benign hematospermia workup - this is ongoing.   Progressive kidney disease - CT hematuria workup 05/2021 - normal kidneys.  Incidentally noted L 1.7cm adrenal adenoma.   Sees [facility] / Scientist, forensic NP ([facility]) for back pain.    Preventative: COLONOSCOPY 02/2017 TA, diverticulosis, int hem no f/u needed Elvin Hammer) Prostate - previously saw Dr Rozanne Corners, most recently [facility] - h/o BPH. On avodart  QOD. Nocturia 4x, weak stream. notes polyuria and nocturia.  Lung cancer screen - not eligible Flu shot yearly COVID vaccine Moderna 08/2019, 09/2019, 04/2020 has continued getting boosters Pneumovax 2002 and 2017, Prevnar-13 2015 Tdap 2010, 01/2021 zostavax 2013 Shingrix - discussed,  declines  RSV - declined Advanced planning - HCPOA and advanced directive scanned in chart 06/2015. Wife Michael Decker then Michael Decker Dates then Michael Decker are [facility].  Seat belt use discussed  Sunscreen use discussed. No changing moles on skin.  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 Interim medical history since our last visit reviewed. Allergies and medications reviewed and updated. Outpatient Medications Prior to Visit  Medication Sig Dispense Refill   acetaminophen  (TYLENOL ) 650 MG CR tablet Take 1,300 mg by mouth at bedtime.     Glucosamine-Chondroitin (OSTEO BI-FLEX REGULAR STRENGTH PO) Take 250 mg by mouth 2 (two) times daily.     Magnesium  Gluconate 250 MG TABS Take 250 mg by mouth daily.     MISC NATURAL PRODUCTS EX Apply 1 application  topically  daily as needed (pain). Cannabis cream     Multiple Vitamins-Minerals (MULTIVITAMIN WITH MINERALS) tablet Take 1 tablet by mouth daily.     Omega-3 Fatty Acids (FISH OIL) 1200 MG CAPS Take 1,200 mg by mouth daily. Krill oil     amLODipine  (NORVASC ) 2.5 MG tablet Take 1 tablet (2.5 mg total) by mouth daily. 90 tablet 3   apixaban  (ELIQUIS ) 5 MG TABS tablet Take 1 tablet (5 mg total) by mouth 2 (two) times daily. 180 tablet 4   dutasteride  (AVODART ) 0.5 MG capsule TAKE 1 CAPSULE BY MOUTH EVERY OTHER DAY 45 capsule 0   telmisartan  (MICARDIS ) 80 MG tablet TAKE 1 TABLET BY MOUTH EVERY DAY 90 tablet 1   No facility-administered medications prior to visit.     Per HPI unless specifically indicated in ROS section below Review of Systems   Constitutional:  Negative for activity change, appetite change, chills, fatigue, fever and unexpected weight change.  HENT:  Negative for hearing loss.   Eyes:  Negative for visual disturbance.  Respiratory:  Negative for cough, chest tightness, shortness of breath and wheezing.   Cardiovascular:  Negative for chest pain, palpitations and leg swelling.  Gastrointestinal:  Negative for abdominal distention, abdominal pain, blood in stool, constipation, diarrhea, nausea and vomiting.  Genitourinary:  Negative for difficulty urinating and hematuria.  Musculoskeletal:  Negative for arthralgias, myalgias and neck pain.  Skin:  Negative for rash.  Neurological:  Negative for dizziness, seizures, syncope and headaches.  Hematological:  Positive for adenopathy (cervical present for months). Does not bruise/bleed easily.  Psychiatric/Behavioral:  Negative for dysphoric mood. The patient is not nervous/anxious.     Objective:  BP (!) 140/70   Pulse 76   Temp 97.8 F (36.6 C) (Oral)   Ht 5' 10.75" (1.797 m)   Wt 178 lb 4 oz (80.9 kg)   SpO2 96%   BMI 25.04 kg/m   Wt Readings from Last 3 Encounters:  11/13/23 178 lb 4 oz (80.9 kg)  11/02/23 179 lb 3.2 oz (81.3 kg)  09/14/23 177 lb (80.3 kg)      Physical Exam Vitals and nursing note reviewed.  Constitutional:      General: He is not in acute distress.    Appearance: Normal appearance. He is well-developed. He is not ill-appearing.  HENT:     Head: Normocephalic and atraumatic.     Right Ear: Hearing, tympanic membrane, ear canal and external ear normal.     Left Ear: Hearing, tympanic membrane, ear canal and external ear normal.     Mouth/Throat:     Mouth: Mucous membranes are moist.     Pharynx: Oropharynx is clear. No oropharyngeal exudate or posterior oropharyngeal erythema.  Eyes:     General: No scleral icterus.    Extraocular Movements: Extraocular movements intact.     Conjunctiva/sclera: Conjunctivae normal.     Pupils:  Pupils are equal, round, and reactive to light.  Neck:     Thyroid : No thyroid  mass or thyromegaly.     Vascular: No carotid bruit.  Cardiovascular:     Rate and Rhythm: Normal rate and regular rhythm.     Pulses: Normal pulses.          Radial pulses are 2+ on the right side and 2+ on the left side.     Heart sounds: Normal heart sounds. No murmur heard. Pulmonary:     Effort: Pulmonary effort is normal. No respiratory distress.     Breath sounds: Normal breath  sounds. No wheezing, rhonchi or rales.  Abdominal:     General: Bowel sounds are normal. There is no distension.     Palpations: Abdomen is soft. There is no mass.     Tenderness: There is no abdominal tenderness. There is no guarding or rebound.     Hernia: No hernia is present.  Musculoskeletal:        General: Normal range of motion.     Cervical back: Normal range of motion and neck supple.     Right lower leg: No edema.     Left lower leg: No edema.  Lymphadenopathy:     Cervical: No cervical adenopathy.  Skin:    General: Skin is warm and dry.     Findings: No rash.  Neurological:     General: No focal deficit present.     Mental Status: He is alert and oriented to person, place, and time.  Psychiatric:        Mood and Affect: Mood normal.        Behavior: Behavior normal.        Thought Content: Thought content normal.        Judgment: Judgment normal.       Results for orders placed or performed in visit on 11/06/23  Vitamin B12   Collection Time: 11/06/23  8:01 AM  Result Value Ref Range   Vitamin B-12 383 211 - 911 pg/mL  CBC with Differential/Platelet   Collection Time: 11/06/23  8:01 AM  Result Value Ref Range   WBC 3.8 (L) 4.0 - 10.5 K/uL   RBC 4.25 4.22 - 5.81 Mil/uL   Hemoglobin 14.7 13.0 - 17.0 g/dL   HCT 60.4 54.0 - 98.1 %   MCV 103.2 (H) 78.0 - 100.0 fl   MCHC 33.6 30.0 - 36.0 g/dL   RDW 19.1 47.8 - 29.5 %   Platelets 164.0 150.0 - 400.0 K/uL   Neutrophils Relative % 53.0 43.0 - 77.0 %    Lymphocytes Relative 32.9 12.0 - 46.0 %   Monocytes Relative 7.6 3.0 - 12.0 %   Eosinophils Relative 5.5 (H) 0.0 - 5.0 %   Basophils Relative 1.0 0.0 - 3.0 %   Neutro Abs 2.0 1.4 - 7.7 K/uL   Lymphs Abs 1.2 0.7 - 4.0 K/uL   Monocytes Absolute 0.3 0.1 - 1.0 K/uL   Eosinophils Absolute 0.2 0.0 - 0.7 K/uL   Basophils Absolute 0.0 0.0 - 0.1 K/uL  Parathyroid  hormone, intact (no Ca)   Collection Time: 11/06/23  8:01 AM  Result Value Ref Range   PTH 21 16 - 77 pg/mL  Microalbumin / creatinine urine ratio   Collection Time: 11/06/23  8:01 AM  Result Value Ref Range   Microalb, Ur <0.7 mg/dL   Creatinine,U 62.1 mg/dL   Microalb Creat Ratio Unable to calculate 0.0 - 30.0 mg/g  VITAMIN D  25 Hydroxy (Vit-D Deficiency, Fractures)   Collection Time: 11/06/23  8:01 AM  Result Value Ref Range   VITD 52.24 30.00 - 100.00 ng/mL  Phosphorus   Collection Time: 11/06/23  8:01 AM  Result Value Ref Range   Phosphorus 3.2 2.3 - 4.6 mg/dL  Comprehensive metabolic panel with GFR   Collection Time: 11/06/23  8:01 AM  Result Value Ref Range   Sodium 136 135 - 145 mEq/L   Potassium 4.2 3.5 - 5.1 mEq/L   Chloride 101 96 - 112 mEq/L   CO2 30 19 - 32 mEq/L   Glucose, Bld 118 (H) 70 -  99 mg/dL   BUN 23 6 - 23 mg/dL   Creatinine, Ser 6.96 0.40 - 1.50 mg/dL   Total Bilirubin 0.6 0.2 - 1.2 mg/dL   Alkaline Phosphatase 67 39 - 117 U/L   AST 23 0 - 37 U/L   ALT 17 0 - 53 U/L   Total Protein 6.1 6.0 - 8.3 g/dL   Albumin 3.9 3.5 - 5.2 g/dL   GFR 29.52 (L) >84.13 mL/min   Calcium 8.8 8.4 - 10.5 mg/dL  Lipid panel   Collection Time: 11/06/23  8:01 AM  Result Value Ref Range   Cholesterol 143 0 - 200 mg/dL   Triglycerides 24.4 0.0 - 149.0 mg/dL   HDL 01.02 >72.53 mg/dL   VLDL 66.4 0.0 - 40.3 mg/dL   LDL Cholesterol 66 0 - 99 mg/dL   Total CHOL/HDL Ratio 2    NonHDL 82.49     Assessment & Plan:   Problem List Items Addressed This Visit     Health maintenance examination - Primary (Chronic)    Preventative protocols reviewed and updated unless pt declined. Discussed healthy diet and lifestyle.       DNR (do not resuscitate) (Chronic)   DNR confirmed. Has form at home.       Permanent atrial fibrillation (HCC) (Chronic)   Continue eliquis        Relevant Medications   apixaban  (ELIQUIS ) 5 MG TABS tablet   telmisartan  (MICARDIS ) 80 MG tablet   amLODipine  (NORVASC ) 2.5 MG tablet   Advanced directives, counseling/discussion (Chronic)   Previously discussed      HTN (hypertension)   Chronic stable on telmisartan  and amlodipine  - continue.       Relevant Medications   apixaban  (ELIQUIS ) 5 MG TABS tablet   telmisartan  (MICARDIS ) 80 MG tablet   amLODipine  (NORVASC ) 2.5 MG tablet   HLD (hyperlipidemia)   Chronic, stable off medications. The ASCVD Risk score (Arnett DK, et al., 2019) failed to calculate for the following reasons:   The 2019 ASCVD risk score is only valid for ages 59 to 67       Relevant Medications   apixaban  (ELIQUIS ) 5 MG TABS tablet   telmisartan  (MICARDIS ) 80 MG tablet   amLODipine  (NORVASC ) 2.5 MG tablet   Benign prostatic hyperplasia   Chronic stable period on every other day dutasteride  - continue.       Relevant Medications   dutasteride  (AVODART ) 0.5 MG capsule   Low back pain   Chronic, established with PM&R - appreciate their care.  Recent lumbar CT - read still pending.       CKD stage 3a, GFR 45-59 ml/min (HCC)   Chronic, stable with GFR 50s.       Status post placement of cardiac pacemaker   S/p PPM placement 05/2023 - has done well after this.         Meds ordered this encounter  Medications   apixaban  (ELIQUIS ) 5 MG TABS tablet    Sig: Take 1 tablet (5 mg total) by mouth 2 (two) times daily.    Dispense:  180 tablet    Refill:  4   dutasteride  (AVODART ) 0.5 MG capsule    Sig: TAKE 1 CAPSULE BY MOUTH EVERY OTHER DAY    Dispense:  45 capsule    Refill:  4   telmisartan  (MICARDIS ) 80 MG tablet    Sig: Take 1 tablet  (80 mg total) by mouth daily.    Dispense:  90 tablet    Refill:  4  cyanocobalamin  (VITAMIN B12) 1000 MCG tablet    Sig: Take 1 tablet (1,000 mcg total) by mouth daily.   amLODipine  (NORVASC ) 2.5 MG tablet    Sig: Take 1 tablet (2.5 mg total) by mouth daily.    Dispense:  90 tablet    Refill:  4    No orders of the defined types were placed in this encounter.   Patient Instructions  You are doing well today Continue current medicines.  Good to see you today Return as needed or in 1 year for next wellness visit/physical   Follow up plan: Return in about 1 year (around 11/12/2024) for annual exam, prior fasting for blood work, medicare wellness visit.  Claire Crick, MD

## 2023-11-13 NOTE — Assessment & Plan Note (Signed)
 Previously discussed.

## 2023-11-13 NOTE — Assessment & Plan Note (Addendum)
 Chronic stable on telmisartan  and amlodipine  - continue.

## 2023-11-13 NOTE — Patient Instructions (Signed)
 You are doing well today Continue current medicines.  Good to see you today Return as needed or in 1 year for next wellness visit/physical

## 2023-11-13 NOTE — Assessment & Plan Note (Signed)
 Chronic stable period on every other day dutasteride  - continue.

## 2023-11-13 NOTE — Assessment & Plan Note (Signed)
 Preventative protocols reviewed and updated unless pt declined. Discussed healthy diet and lifestyle.

## 2023-11-13 NOTE — Assessment & Plan Note (Signed)
 Chronic, stable off medications. The ASCVD Risk score (Arnett DK, et al., 2019) failed to calculate for the following reasons:   The 2019 ASCVD risk score is only valid for ages 33 to 32

## 2023-11-13 NOTE — Assessment & Plan Note (Signed)
 Continue eliquis  ?

## 2023-11-13 NOTE — Assessment & Plan Note (Signed)
 Chronic, established with PM&R - appreciate their care.  Recent lumbar CT - read still pending.

## 2023-11-13 NOTE — Assessment & Plan Note (Signed)
Chronic, stable with GFR 50s.

## 2023-11-18 ENCOUNTER — Encounter: Payer: Self-pay | Admitting: Family Medicine

## 2023-11-19 NOTE — Telephone Encounter (Signed)
 Eliquis  is on pt's med list.

## 2023-11-22 DIAGNOSIS — M47816 Spondylosis without myelopathy or radiculopathy, lumbar region: Secondary | ICD-10-CM | POA: Diagnosis not present

## 2023-11-22 DIAGNOSIS — M4807 Spinal stenosis, lumbosacral region: Secondary | ICD-10-CM | POA: Diagnosis not present

## 2023-11-22 DIAGNOSIS — M461 Sacroiliitis, not elsewhere classified: Secondary | ICD-10-CM | POA: Diagnosis not present

## 2023-12-12 ENCOUNTER — Ambulatory Visit (INDEPENDENT_AMBULATORY_CARE_PROVIDER_SITE_OTHER): Payer: PPO

## 2023-12-12 DIAGNOSIS — R001 Bradycardia, unspecified: Secondary | ICD-10-CM | POA: Diagnosis not present

## 2023-12-12 LAB — CUP PACEART REMOTE DEVICE CHECK
Battery Remaining Longevity: 157 mo
Battery Voltage: 3.12 V
Brady Statistic RV Percent Paced: 99.8 %
Date Time Interrogation Session: 20250520232355
Implantable Lead Connection Status: 753985
Implantable Lead Implant Date: 20241119
Implantable Lead Location: 753860
Implantable Lead Model: 3830
Implantable Pulse Generator Implant Date: 20241119
Lead Channel Impedance Value: 323 Ohm
Lead Channel Impedance Value: 589 Ohm
Lead Channel Pacing Threshold Amplitude: 0.625 V
Lead Channel Pacing Threshold Pulse Width: 0.4 ms
Lead Channel Sensing Intrinsic Amplitude: 10.875 mV
Lead Channel Sensing Intrinsic Amplitude: 10.875 mV
Lead Channel Setting Pacing Amplitude: 2 V
Lead Channel Setting Pacing Pulse Width: 0.4 ms
Lead Channel Setting Sensing Sensitivity: 0.9 mV
Zone Setting Status: 755011

## 2023-12-13 ENCOUNTER — Ambulatory Visit: Payer: Self-pay | Admitting: Cardiology

## 2023-12-14 NOTE — Progress Notes (Unsigned)
 Referring Physician:  No referring provider defined for this encounter.  Primary Physician:  Claire Crick, MD  History of Present Illness: 12/20/2023 Michael Decker is here today with a chief complaint of longstanding bilateral low back pain just above his buttock.  He denies any radiation into his legs or muscle cramping.  He states that it is worse at night and is usually aching.  He finds that hemp cream in his lower back area is most helpful.  Denies any weakness, saddle anesthesia, numbness or tingling.  His back pain does not stop him from working out every day.     Duration: 3 years Quality: aching Precipitating: aggravated by sleeping at night Modifying factors: made better by getting up Weakness: none Timing: intermittent Bowel/Bladder Dysfunction: none  Conservative measures: going to the gym at [employer] about 3 times Physical therapy: has participated in about 2 years ago without relief Multimodal medical therapy including regular antiinflammatories: Lidocaine  patches, Aleve, Meloxicam , Tylenol  Injections:  09/14/2023: Bilateral sacroiliac joint injection- help for about a week 05/18/2022: Left sacroiliac joint injection (50% relief)  12/08/2019: Right ultrasound-guided SI joint injection performed by Dr. Windle Hatch (little relief) 05/12/2019: Left SI joint injection injection performed by Dr. Windle Hatch (moderate relief)   Past Surgery: no spinal surgeries   The symptoms are causing a significant impact on the patient's life.   Review of Systems:  A 10 point review of systems is negative, except for the pertinent positives and negatives detailed in the HPI.  Past Medical History: Past Medical History:  Diagnosis Date   Allergy a long time ago   it is what it is   Arthritis    knee   BPH (benign prostatic hypertrophy)    on avodart , released from urologist care   Cataract 2 years ago   they are fine   Colon polyps 2013   TA 2013 Elvin Hammer)    Diverticulosis    GERD (gastroesophageal reflux disease)    Hematospermia    s/p uro eval   HLD (hyperlipidemia)    mild   HTN (hypertension)    Wears hearing aid in left ear     Past Surgical History: Past Surgical History:  Procedure Laterality Date   BUNIONECTOMY Right 2014   CARDIOVERSION N/A 07/09/2020   Procedure: CARDIOVERSION;  Surgeon: Wenona Hamilton, MD;  Location: [facility] ORS;  Service: Cardiovascular;  Laterality: N/A;   CATARACT EXTRACTION W/PHACO Right 08/14/2018   Procedure: CATARACT EXTRACTION PHACO AND INTRAOCULAR LENS PLACEMENT (IOC)  RIGHT;  Surgeon: Annell Kidney, MD;  Location: [facility];  Service: Ophthalmology;  Laterality: Right;  requests arrival between 830 & 1030   CATARACT EXTRACTION W/PHACO Left 09/11/2018   Procedure: CATARACT EXTRACTION PHACO AND INTRAOCULAR LENS PLACEMENT (IOC)  LEFT;  Surgeon: Annell Kidney, MD;  Location: [facility];  Service: Ophthalmology;  Laterality: Left;  Doesn't want to be early   COLONOSCOPY  2008   COLONOSCOPY  01/2012   TA x1, diverticulosis, rpt 5 yrs Elvin Hammer)   COLONOSCOPY  02/2017   TA, diverticulosis, int hem no f/u needed Elvin Hammer)   CYSTECTOMY     PACEMAKER IMPLANT N/A 06/12/2023   Procedure: PACEMAKER IMPLANT;  Surgeon: Boyce Byes, MD;  Location: [facility];  Service: Cardiovascular;  Laterality: N/A;   TONSILLECTOMY  1944   UPPER GI ENDOSCOPY      Allergies: Allergies as of 12/20/2023 - Review Complete 12/20/2023  Allergen Reaction Noted   Metoprolol  Rash 09/27/2020   Oxycodone Other (  See Comments) 06/11/2023    Medications: Outpatient Encounter Medications as of 12/20/2023  Medication Sig   acetaminophen  (TYLENOL ) 650 MG CR tablet Take 1,300 mg by mouth at bedtime.   amLODipine  (NORVASC ) 2.5 MG tablet Take 1 tablet (2.5 mg total) by mouth daily.   apixaban  (ELIQUIS ) 5 MG TABS tablet Take 1 tablet (5 mg total) by mouth 2 (two) times daily.   dutasteride  (AVODART )  0.5 MG capsule TAKE 1 CAPSULE BY MOUTH EVERY OTHER DAY   Glucosamine-Chondroitin (OSTEO BI-FLEX REGULAR STRENGTH PO) Take 250 mg by mouth 2 (two) times daily.   Magnesium  Gluconate 250 MG TABS Take 250 mg by mouth daily.   MISC NATURAL PRODUCTS EX Apply 1 application  topically daily as needed (pain). Cannabis cream   Multiple Vitamins-Minerals (MULTIVITAMIN WITH MINERALS) tablet Take 1 tablet by mouth daily.   Omega-3 Fatty Acids (FISH OIL) 1200 MG CAPS Take 1,200 mg by mouth daily. Krill oil   telmisartan  (MICARDIS ) 80 MG tablet Take 1 tablet (80 mg total) by mouth daily.   [DISCONTINUED] cyanocobalamin  (VITAMIN B12) 1000 MCG tablet Take 1 tablet (1,000 mcg total) by mouth daily.   No facility-administered encounter medications on file as of 12/20/2023.    Social History: Social History   Tobacco Use   Smoking status: Former    Current packs/day: 0.00    Types: Cigarettes    Quit date: 03/08/1974    Years since quitting: 49.8   Smokeless tobacco: Never   Tobacco comments:    Experimented  decided not for me  Vaping Use   Vaping status: Never Used  Substance Use Topics   Alcohol use: Not Currently    Comment: occassionally-beer or wine   Drug use: No    Family Medical History: Family History  Problem Relation Age of Onset   Sudden death Father 23       ?MI   Heart disease Brother 26       ?afib   Cancer Neg Hx    Diabetes Neg Hx    Stroke Neg Hx    Colon cancer Neg Hx    Esophageal cancer Neg Hx    Stomach cancer Neg Hx     Physical Examination: @VITALWITHPAIN @  General: Patient is well developed, well nourished, calm, collected, and in no apparent distress. Attention to examination is appropriate.  Psychiatric: Patient is non-anxious.  Head:  Pupils equal, round, and reactive to light.  ENT:  Oral mucosa appears well hydrated.  Neck:   Supple.  Full range of motion.  Respiratory: Patient is breathing without any difficulty.  Extremities: No  edema.  Vascular: Palpable dorsal pedal pulses.  Skin:   On exposed skin, there are no abnormal skin lesions.  NEUROLOGICAL:     Awake, alert, oriented to person, place, and time.  Speech is clear and fluent. Fund of knowledge is appropriate.   Cranial Nerves: Pupils equal round and reactive to light.  Facial tone is symmetric.   ROM of spine: Tenderness to palpation about his SI joints.  Strength:  Side Iliopsoas Quads Hamstring PF DF EHL  R 5 5 5 5 5 5   L 5 5 5 5 5 5    Reflexes are 2+ and symmetric at the patella and achilles.    Clonus is not present.  Toes are down-going.  Bilateral upper and lower extremity sensation is intact to light touch.    Gait is normal.   No difficulty with tandem gait.   No evidence of dysmetria noted.  Medical Decision Making  Imaging: Narrative & Impression  CLINICAL DATA:  Chronic low back and buttock pain   EXAM: CT LUMBAR SPINE WITHOUT CONTRAST   TECHNIQUE: Multidetector CT imaging of the lumbar spine was performed without intravenous contrast administration. Multiplanar CT image reconstructions were also generated.   RADIATION DOSE REDUCTION: This exam was performed according to the departmental dose-optimization program which includes automated exposure control, adjustment of the mA and/or kV according to patient size and/or use of iterative reconstruction technique.   COMPARISON:  Radiograph 10/25/2021, CT 05/23/2021   FINDINGS: Segmentation: 5 lumbar type vertebrae.   Alignment: 5 mm grade 1 anterolisthesis L4 on L5.   Vertebrae: Normal vertebral body stature.  No fracture.   Paraspinal and other soft tissues: Aortic atherosclerosis. Diverticular disease of the sigmoid colon.   Disc levels:   At T12-L1, patent disc space.  No canal stenosis.  Patent foramen.   At L1-L2, advanced disc space narrowing with vacuum disc and osteophyte. No canal stenosis. Moderate bilateral facet degenerative changes. Patent right  foramen. No high-grade foraminal narrowing.   At L2-L3, advanced disc space narrowing. Mild disc bulge. No high-grade canal stenosis. Moderate bilateral facet degenerative changes. Patent foramen.   At L3-L4, advanced disc space narrowing and osteophyte. Diffuse disc bulge with calcified disc osteophyte complex. Moderate canal stenosis. Moderate bilateral facet degenerative changes. Moderate severe bilateral foraminal narrowing.   At L4-L5, advanced disc space narrowing and vacuum disc. Diffuse disc bulge. Severe hypertrophic facet arthropathy bilaterally. At least mild canal stenosis. Mild left and moderate right foraminal narrowing.   At L5-S1, mild disc space narrowing. Bulky anterior osteophyte. Mild disc bulge with slight mass effect on anterior thecal sac. Severe hypertrophic facet degenerative changes bilaterally. No significant canal stenosis. No significant foraminal narrowing.   IMPRESSION: 1. Advanced multilevel degenerative changes of the lumbar spine. Grade 1 anterolisthesis L4 on L5. Multilevel canal stenosis as detailed above, worst at L3-L4. Moderate severe posterior facet degenerative changes L3 through S1; moderate severe bilateral foraminal narrowing at L3-L4. 2. Aortic atherosclerosis.    I have personally reviewed the images and agree with the above interpretation.  Assessment and Plan: Michael Decker is a pleasant 89 y.o. male  is here today with a chief complaint of longstanding bilateral low back pain just above his buttock.  He denies any radiation into his legs or muscle cramping.  He states that it is worse at night and is usually aching.  He finds that hemp cream in his lower back area is most helpful.  Denies any weakness, saddle anesthesia, numbness or tingling.  His back pain does not stop him from working out every day.  On examination he has some tenderness to palpation about his SI joints.  Otherwise he has 5 out of 5 strength in bilateral lower  extremities.  His CT scan was reviewed which did show significant facet arthropathy from L3-S1 and several levels of canal stenosis.  It was a pleasure to see this patient today.  He currently exercises every day and does not want to continue with a formal physical therapy plan.  Advised patient to continue seeing his physical medicine and rehabilitation doctors to continue with facet injections.  He is in agreement with this plan.  We also discussed potential use of diclofenac gel.  He is present told to avoid NSAIDs so as result I will reach out to his cardiologist for approval.  Advised patient to let me know if he has any significant changes.  Happy to  see him on as-needed basis. Thank you for involving me in the care of this patient.   I spent a total of 45 minutes in both face-to-face and non-face-to-face activities for this visit on the date of this encounter including preparing to see the patient, obtaining and reviewing separate obtained history, performing medically appropriate examination, counseling the patient, documentation, coordination of care.  Ludwig Safer, PA-C Dept. of Neurosurgery

## 2023-12-19 ENCOUNTER — Inpatient Hospital Stay
Admission: RE | Admit: 2023-12-19 | Discharge: 2023-12-19 | Disposition: A | Discharge status: Home / Self Care | Payer: Self-pay | Source: Ambulatory Visit | Attending: Neurosurgery | Admitting: Neurosurgery

## 2023-12-19 ENCOUNTER — Other Ambulatory Visit: Payer: Self-pay | Admitting: Family Medicine

## 2023-12-19 DIAGNOSIS — Z049 Encounter for examination and observation for unspecified reason: Secondary | ICD-10-CM

## 2023-12-20 ENCOUNTER — Ambulatory Visit: Admitting: Physician Assistant

## 2023-12-20 VITALS — BP 128/78 | Ht 70.75 in | Wt 177.0 lb

## 2023-12-20 DIAGNOSIS — M48061 Spinal stenosis, lumbar region without neurogenic claudication: Secondary | ICD-10-CM | POA: Diagnosis not present

## 2023-12-20 DIAGNOSIS — M47817 Spondylosis without myelopathy or radiculopathy, lumbosacral region: Secondary | ICD-10-CM

## 2023-12-22 ENCOUNTER — Encounter: Payer: Self-pay | Admitting: Family Medicine

## 2023-12-30 ENCOUNTER — Other Ambulatory Visit: Payer: Self-pay | Admitting: Family Medicine

## 2023-12-30 DIAGNOSIS — N401 Enlarged prostate with lower urinary tract symptoms: Secondary | ICD-10-CM

## 2023-12-31 NOTE — Telephone Encounter (Signed)
 Too soon. Rx sent 11/13/23, #45/4 refills to [facility]  Request denied.

## 2024-01-03 DIAGNOSIS — L814 Other melanin hyperpigmentation: Secondary | ICD-10-CM | POA: Diagnosis not present

## 2024-01-03 DIAGNOSIS — L821 Other seborrheic keratosis: Secondary | ICD-10-CM | POA: Diagnosis not present

## 2024-01-03 DIAGNOSIS — D225 Melanocytic nevi of trunk: Secondary | ICD-10-CM | POA: Diagnosis not present

## 2024-01-28 ENCOUNTER — Ambulatory Visit (INDEPENDENT_AMBULATORY_CARE_PROVIDER_SITE_OTHER): Admitting: Physician Assistant

## 2024-01-28 DIAGNOSIS — M48061 Spinal stenosis, lumbar region without neurogenic claudication: Secondary | ICD-10-CM

## 2024-01-28 NOTE — Progress Notes (Signed)
 Call patient for follow-up he stated he did not want to continue the, secondary to having to go forward with a co-pay if he were to continue.

## 2024-01-30 NOTE — Progress Notes (Signed)
 Remote pacemaker transmission.

## 2024-02-07 ENCOUNTER — Other Ambulatory Visit: Payer: Self-pay | Admitting: Physician Assistant

## 2024-02-07 DIAGNOSIS — M48061 Spinal stenosis, lumbar region without neurogenic claudication: Secondary | ICD-10-CM

## 2024-02-13 NOTE — Addendum Note (Signed)
 Addended by: Princeton Nabor W on: 02/13/2024 10:38 AM   Modules accepted: Orders

## 2024-02-26 ENCOUNTER — Ambulatory Visit (HOSPITAL_COMMUNITY)
Admission: RE | Admit: 2024-02-26 | Discharge: 2024-02-26 | Disposition: A | Discharge status: Home / Self Care | Source: Ambulatory Visit | Attending: Physician Assistant | Admitting: Physician Assistant

## 2024-02-26 DIAGNOSIS — M48061 Spinal stenosis, lumbar region without neurogenic claudication: Secondary | ICD-10-CM | POA: Diagnosis not present

## 2024-02-26 DIAGNOSIS — M5136 Other intervertebral disc degeneration, lumbar region with discogenic back pain only: Secondary | ICD-10-CM | POA: Diagnosis not present

## 2024-02-26 DIAGNOSIS — M47816 Spondylosis without myelopathy or radiculopathy, lumbar region: Secondary | ICD-10-CM | POA: Diagnosis not present

## 2024-02-26 DIAGNOSIS — M4310 Spondylolisthesis, site unspecified: Secondary | ICD-10-CM | POA: Diagnosis not present

## 2024-02-26 NOTE — CV Procedure (Signed)
  Device system confirmed to be MRI conditional, with implant date > 6 weeks ago, and no evidence of abandoned or epicardial leads in review of most recent CXR  Device last cleared by EP Provider:   Prentice Passey 02/26/2024  Clearance is good through for 1 year as long as parameters remain stable at time of check. If pt undergoes a cardiac device procedure during that time, they should be re-cleared.   Tachy-therapies to be programmed off if applicable with device back to pre-MRI settings after completion of exam.  [employer] - Programming recommendation received through [employer] App/Tablet  Woolard, [place], RT  02/26/2024 9:00 AM

## 2024-02-28 ENCOUNTER — Other Ambulatory Visit: Payer: Self-pay | Admitting: Family Medicine

## 2024-03-03 NOTE — Progress Notes (Signed)
 Referring Physician:  Rilla Baller, MD [address] [place],  [place] [address]  Primary Physician:  Rilla Baller, MD  History of Present Illness: 03/06/2024 [name] is here today with a chief complaint of back pain.  He is still active.  His history is well reviewed below.  I have confirmed the key details.   History of Present Illness: 12/20/2023 note from Lyle Decamp, PA-C [name] is here today with a chief complaint of longstanding bilateral low back pain just above his buttock.  He denies any radiation into his legs or muscle cramping.  He states that it is worse at night and is usually aching.  He finds that hemp cream in his lower back area is most helpful.  Denies any weakness, saddle anesthesia, numbness or tingling.  His back pain does not stop him from working out every day.        Duration: 3 years Quality: aching Precipitating: aggravated by sleeping at night Modifying factors: made better by getting up Weakness: none Timing: intermittent Bowel/Bladder Dysfunction: none   Conservative measures: going to the gym at [employer] about 3 times Physical therapy: has participated in about 2 years ago without relief Multimodal medical therapy including regular antiinflammatories: Lidocaine patches, Aleve, Meloxicam, Tylenol Injections:  09/14/2023: Bilateral sacroiliac joint injection- help for about a week 05/18/2022: Left sacroiliac joint injection (50% relief)  12/08/2019: Right ultrasound-guided SI joint injection performed by Dr. Sharrie (little relief) 05/12/2019: Left SI joint injection injection performed by Dr. Sharrie (moderate relief)     Past Surgery: no spinal surgeries     The symptoms are causing a significant impact on the patient's life.        I have utilized the care everywhere function in epic to review the outside records available from external health systems.   Discussed the use of AI scribe  software for clinical note transcription with the patient, who gave verbal consent to proceed.       Review of Systems:  A 10 point review of systems is negative, except for the pertinent positives and negatives detailed in the HPI.  Past Medical History: Past Medical History:  Diagnosis Date   Allergy a long time ago   it is what it is   Arthritis    knee   BPH (benign prostatic hypertrophy)    on avodart, released from urologist care   Cataract 2 years ago   they are fine   Colon polyps 2013   TA 2013 Oletta)   Diverticulosis    GERD (gastroesophageal reflux disease)    Hematospermia    s/p uro eval   HLD (hyperlipidemia)    mild   HTN (hypertension)    Wears hearing aid in left ear     Past Surgical History: Past Surgical History:  Procedure Laterality Date   BUNIONECTOMY Right 2014   CARDIOVERSION N/A 07/09/2020   Procedure: CARDIOVERSION;  Surgeon: Darron Deatrice LABOR, MD;  Location: [facility] OR[facility];  Service: Cardiovascular;  Laterality: N/A;   CATARACT EXTRACTION W/PHACO Right 08/14/2018   Procedure: CATARACT EXTRACTION PHACO AND INTRAOCULAR LENS PLACEMENT (IOC)  RIGHT;  Surgeon: Mittie Gaskin, MD;  Location: [facility];  Service: Ophthalmology;  Laterality: Right;  requests arrival between 830 & 1030   CATARACT EXTRACTION W/PHACO Left 09/11/2018   Procedure: CATARACT EXTRACTION PHACO AND INTRAOCULAR LENS PLACEMENT (IOC)  LEFT;  Surgeon: Mittie Gaskin, MD;  Location: [facility];  Service: Ophthalmology;  Laterality: Left;  Doesn't want to be early   COLONOSCOPY  2008   COLONOSCOPY  01/2012   TA x1, diverticulosis, rpt 5 yrs Oletta)   COLONOSCOPY  02/2017   TA, diverticulosis, int hem no f/u needed Oletta)   CYSTECTOMY     PACEMAKER IMPLANT N/A 06/12/2023   Procedure: PACEMAKER IMPLANT;  Surgeon: Cindie Ole DASEN, MD;  Location: [facility];  Service: Cardiovascular;  Laterality: N/A;   TONSILLECTOMY  1944   UPPER GI ENDOSCOPY       Allergies: Allergies as of 03/06/2024 - Review Complete 03/06/2024  Allergen Reaction Noted   Metoprolol Rash 09/27/2020   Oxycodone Other (See Comments) 06/11/2023    Medications:  Current Outpatient Medications:    acetaminophen (TYLENOL) 650 MG CR tablet, Take 1,300 mg by mouth at bedtime., Disp: , Rfl:    amLODipine (NORVASC) 2.5 MG tablet, Take 1 tablet (2.5 mg total) by mouth daily., Disp: 90 tablet, Rfl: 4   apixaban (ELIQUIS) 5 MG TABS tablet, Take 1 tablet (5 mg total) by mouth 2 (two) times daily., Disp: 180 tablet, Rfl: 4   dutasteride (AVODART) 0.5 MG capsule, TAKE 1 CAPSULE BY MOUTH EVERY OTHER DAY, Disp: 45 capsule, Rfl: 4   Glucosamine-Chondroitin (OSTEO BI-FLEX REGULAR STRENGTH PO), Take 250 mg by mouth 2 (two) times daily., Disp: , Rfl:    Magnesium Gluconate 250 MG TABS, Take 250 mg by mouth daily., Disp: , Rfl:    MISC NATURAL PRODUCTS EX, Apply 1 application  topically daily as needed (pain). Cannabis cream, Disp: , Rfl:    Multiple Vitamins-Minerals (MULTIVITAMIN WITH MINERALS) tablet, Take 1 tablet by mouth daily., Disp: , Rfl:    NON FORMULARY, HEMP CREAM AS NEEDED, Disp: , Rfl:    Omega-3 Fatty Acids (FISH OIL) 1200 MG CAPS, Take 1,200 mg by mouth daily. Krill oil, Disp: , Rfl:    telmisartan (MICARDIS) 80 MG tablet, Take 1 tablet (80 mg total) by mouth daily., Disp: 90 tablet, Rfl: 4  Social History: Social History   Tobacco Use   Smoking status: Former    Current packs/day: 0.00    Types: Cigarettes    Quit date: 03/08/1974    Years since quitting: 50.0   Smokeless tobacco: Never   Tobacco comments:    Experimented  decided not for me  Vaping Use   Vaping status: Never Used  Substance Use Topics   Alcohol use: Not Currently    Comment: occassionally-beer or wine   Drug use: No    Family Medical History: Family History  Problem Relation Age of Onset   Sudden death Father 53       ?MI   Heart disease Brother 38       ?afib   Cancer Neg Hx     Diabetes Neg Hx    Stroke Neg Hx    Colon cancer Neg Hx    Esophageal cancer Neg Hx    Stomach cancer Neg Hx     Physical Examination: Vitals:   03/06/24 1100  BP: 130/80    General: Patient is in no apparent distress. Attention to examination is appropriate.  Neck:   Supple.  Full range of motion.  Respiratory: Patient is breathing without any difficulty.   NEUROLOGICAL:     Awake, alert, oriented to person, place, and time.  Speech is clear and fluent.   Cranial Nerves: Pupils equal round and reactive to light.  Facial tone is symmetric.  Facial sensation is symmetric. Shoulder shrug is symmetric. Tongue  protrusion is midline.  There is no pronator drift.  Strength: Side Biceps Triceps Deltoid Interossei Grip Wrist Ext. Wrist Flex.  R 5 5 5 5 5 5 5   L 5 5 5 5 5 5 5    Side Iliopsoas Quads Hamstring PF DF EHL  R 5 5 5 5 5 5   L 5 5 5 5 5 5    Reflexes are 1+ and symmetric at the biceps, triceps, brachioradialis, patella and achilles.   Hoffman's is absent.   Bilateral upper and lower extremity sensation is intact to light touch.    No evidence of dysmetria noted.  Gait is normal.     Medical Decision Making  Imaging: MRI L spine 02/26/2024 L1-L2: There is severe degenerative disc disease with a mild disc bulge. Mild facet arthropathy. No spinal stenosis or foraminal stenosis   L2-L3: There is moderate degenerative disc disease with a mild disc bulge. Mild facet arthropathy. There is no spinal stenosis or foraminal stenosis   L3-L4: There is severe degenerative disc disease with a mild disc bulge. There is moderate facet arthropathy with facet enlargement. There is mild spinal stenosis. There is moderate bilateral neural foraminal stenosis   L4-L5: There is severe degenerative disc disease. There is severe facet arthropathy with a slight degenerative spondylolisthesis and mild spinal stenosis. There is mild bilateral neural foraminal stenosis   L5-S1:  The disc height is preserved. There is mild facet arthropathy. No spinal stenosis or foraminal stenosis   IMPRESSION: Lumbar spondylosis.   There is mild spinal stenosis and moderate bilateral neural foraminal stenosis at L3-4.   No significant change compared with the prior study from December 24, 2019     Electronically Signed   By: Nancyann Burns M.D.   On: 03/03/2024 10:06  I have personally reviewed the images and agree with the above interpretation.  Assessment and Plan: [name] is a pleasant 89 y.o. male with severe lumbar spondylosis.  He has back pain secondary to this.  He does not have any leg pain.  I have recommended that he consider medial branch blocks and possible radiofrequency ablation with Dr. Avanell for now.   I spent a total of 30 minutes in this patient's care today. This time was spent reviewing pertinent records including imaging studies, obtaining and confirming history, performing a directed evaluation, formulating and discussing my recommendations, and documenting the visit within the medical record.       Thank you for involving me in the care of this patient.      Alandria Butkiewicz K. Clois MD, [facility] Neurosurgery

## 2024-03-06 ENCOUNTER — Encounter: Payer: Self-pay | Admitting: Neurosurgery

## 2024-03-06 ENCOUNTER — Ambulatory Visit: Admitting: Neurosurgery

## 2024-03-06 VITALS — BP 130/80 | Ht 71.0 in | Wt 176.4 lb

## 2024-03-06 DIAGNOSIS — M545 Low back pain, unspecified: Secondary | ICD-10-CM

## 2024-03-06 DIAGNOSIS — M47816 Spondylosis without myelopathy or radiculopathy, lumbar region: Secondary | ICD-10-CM

## 2024-03-10 DIAGNOSIS — M4807 Spinal stenosis, lumbosacral region: Secondary | ICD-10-CM | POA: Diagnosis not present

## 2024-03-10 DIAGNOSIS — M47816 Spondylosis without myelopathy or radiculopathy, lumbar region: Secondary | ICD-10-CM | POA: Diagnosis not present

## 2024-03-12 ENCOUNTER — Ambulatory Visit (INDEPENDENT_AMBULATORY_CARE_PROVIDER_SITE_OTHER): Payer: PPO

## 2024-03-12 DIAGNOSIS — R001 Bradycardia, unspecified: Secondary | ICD-10-CM

## 2024-03-13 ENCOUNTER — Ambulatory Visit: Payer: Self-pay | Admitting: Cardiology

## 2024-03-13 LAB — CUP PACEART REMOTE DEVICE CHECK
Battery Remaining Longevity: 156 mo
Battery Voltage: 3.08 V
Brady Statistic RV Percent Paced: 99.86 %
Date Time Interrogation Session: 20250820000143
Implantable Lead Connection Status: 753985
Implantable Lead Implant Date: 20241119
Implantable Lead Location: 753860
Implantable Lead Model: 3830
Implantable Pulse Generator Implant Date: 20241119
Lead Channel Impedance Value: 323 Ohm
Lead Channel Impedance Value: 646 Ohm
Lead Channel Pacing Threshold Amplitude: 0.875 V
Lead Channel Pacing Threshold Pulse Width: 0.4 ms
Lead Channel Sensing Intrinsic Amplitude: 10.625 mV
Lead Channel Sensing Intrinsic Amplitude: 10.625 mV
Lead Channel Setting Pacing Amplitude: 2 V
Lead Channel Setting Pacing Pulse Width: 0.4 ms
Lead Channel Setting Sensing Sensitivity: 0.9 mV
Zone Setting Status: 755011

## 2024-03-20 ENCOUNTER — Ambulatory Visit: Admitting: Neurosurgery

## 2024-03-21 DIAGNOSIS — M47816 Spondylosis without myelopathy or radiculopathy, lumbar region: Secondary | ICD-10-CM | POA: Diagnosis not present

## 2024-04-08 DIAGNOSIS — M47816 Spondylosis without myelopathy or radiculopathy, lumbar region: Secondary | ICD-10-CM | POA: Diagnosis not present

## 2024-04-09 ENCOUNTER — Encounter: Payer: Self-pay | Admitting: Cardiology

## 2024-04-09 NOTE — Progress Notes (Unsigned)
 PERIOPERATIVE PRESCRIPTION FOR IMPLANTED CARDIAC DEVICE PROGRAMMING  Patient Information: Name:  EDAHI KROENING  DOB:  Sep 22, 1933  MRN:  [id number]  {TIP - You do not have to delete this tip  -  Copy the info from the staff message sent by the PAT staff  then press F2 here and paste the information using CTL - V on the next line :[id number]}  *** Device Information:  Clinic EP Physician:  {Press F2 for EP Physician:[id number]}   Device Type:  {Press F2 and Choose 1 (or both)  :789639746} Manufacturer and Phone #:  {Press F2 to select Rep Info  :[id number]} Pacemaker Dependent?:  {yes no :314532} Date of Last Device Check:  *** Normal Device Function?:  {yes no   :314532}  Electrophysiologist's Recommendations:  Have magnet available. Provide continuous ECG monitoring when magnet is used or reprogramming is to be performed.  {Press F2, choose one   :[id number]}  Per Device Clinic Standing Orders, Prentice JINNY Silvan, RN  4:38 PM 04/09/2024

## 2024-04-10 DIAGNOSIS — L570 Actinic keratosis: Secondary | ICD-10-CM | POA: Diagnosis not present

## 2024-04-11 ENCOUNTER — Encounter: Payer: Self-pay | Admitting: Cardiology

## 2024-04-11 NOTE — Progress Notes (Signed)
 PERIOPERATIVE PRESCRIPTION FOR IMPLANTED CARDIAC DEVICE PROGRAMMING  Patient Information: Name:  Michael Decker  DOB:  1934/03/17  MRN:  [id number]  Planned Procedure:  MBB/RFA to bilateral L4-5 and L5-S1 facet joints Surgeon:  Morene Dunnings Chasnis, DO Date of Procedure: April 24, 2024 and May 01, 2024 Position during surgery:  prone (?) Cautery will be used.  Device Information:  Clinic EP Physician:  Dr. Ole Holts  Device Type:  Pacemaker Manufacturer and Phone #:  Medtronic: [id number] Pacemaker Dependent?:  Yes.  -intermittently- V paces 99.9% Date of Last Device Check:  03/12/2024 Normal Device Function?:  Yes.    Electrophysiologist's Recommendations:  Have magnet available. Provide continuous ECG monitoring when magnet is used or reprogramming is to be performed.  Procedure will likely interfere with device function.  Device should be programmed:  Asynchronous pacing during procedure and returned to normal programming after procedure  Per Device Clinic Standing Orders, Delon DELENA Sharps, RN  1:10 PM 04/11/2024

## 2024-04-16 NOTE — Progress Notes (Signed)
 Remote PPM Transmission

## 2024-04-17 ENCOUNTER — Telehealth: Payer: Self-pay

## 2024-04-17 NOTE — Telephone Encounter (Signed)
 Patient podiatrist office called and LVM stating they have questions about the clearance that was sent to them. Direct number is [phone]

## 2024-04-18 NOTE — Telephone Encounter (Signed)
 Contacted office to discuss procedure.  They wanted to clarify that he will require ECG monitoring and Magnet/Reprogramming because they do not have ECG monitoring capability.   Explained patient is intermittently dependent on his PPM. Due to this, positioning during surgery and use of cautery - risk of suppression of PPM could cause patient to flat line. Needs ECG monitoring.  They are going to refer patient out for procedure.

## 2024-05-06 ENCOUNTER — Telehealth: Payer: Self-pay

## 2024-05-06 NOTE — Telephone Encounter (Signed)
 Copied from CRM [id number]. Topic: General - Other >> May 05, 2024 11:31 AM Suzen RAMAN wrote: Reason for CRM: Patient would like a call back from Dr. KANDICE requesting a procedure patient was scheduled to have and patient received  a call that procedure was cancelled due to them not having proper equipment and patient would like to know how he should proceed forward.     [phone] (H)

## 2024-05-09 NOTE — Telephone Encounter (Addendum)
 Can we call and get more info?  Looks like podiatry wanted to do a procedure on patient but as he is dependent on PPM, cardiology said he needed to have EKG monitoring. Podiatry was unable to do this and they were planning on referring out. Who is his podiatrist and was he told who they were going to refer him to?  I don't know that I have much to add for this issue. I have not spoken with patient.

## 2024-05-13 ENCOUNTER — Telehealth: Payer: Self-pay | Admitting: Family Medicine

## 2024-05-13 NOTE — Telephone Encounter (Signed)
 Copied from CRM [id number]. Topic: Referral - Request for Referral >> May 13, 2024 11:55 AM Ashley R wrote: Appointment offered? Yes  Type of order/referral and detailed reason for visit: Aplation treatment on lower nerves - referral requested.   3 step process, stopped on 2 step at [facility] in [place]. because they do not have the right equipment due to him having a pacemaker.   Preference of office, provider, location: Open to anyone [place]. [facility] not taking any new clients.   If referral order, have you been seen by this specialty before? Yes (If Yes, this issue or another issue? When? Where?  Can we respond through MyChart? Yes

## 2024-05-13 NOTE — Telephone Encounter (Signed)
 Called patient He was able to get referral set up with [facility]. Will be seen today there. Will reach out if anything further needed from our office.

## 2024-05-13 NOTE — Telephone Encounter (Unsigned)
 Copied from CRM [id number]. Topic: Referral - Request for Referral >> May 13, 2024 11:55 AM Ashley R wrote: Appointment offered? Yes  Type of order/referral and detailed reason for visit: Aplation treatment on lower nerves - referral requested.   3 step process, stopped on 2 step at [facility] in [place]. because they do not have the right equipment due to him having a pacemaker.   Preference of office, provider, location: Open to anyone [place]. [facility] not taking any new clients.   If referral order, have you been seen by this specialty before? Yes (If Yes, this issue or another issue? When? Where?  Can we respond through MyChart? Yes

## 2024-05-14 NOTE — Telephone Encounter (Signed)
 Per last messages this referral needs to come from podiatry right?

## 2024-05-23 NOTE — Telephone Encounter (Signed)
Patient scheduled appt to discuss

## 2024-05-28 ENCOUNTER — Encounter: Payer: Self-pay | Admitting: Family Medicine

## 2024-05-28 ENCOUNTER — Ambulatory Visit (INDEPENDENT_AMBULATORY_CARE_PROVIDER_SITE_OTHER): Admitting: Family Medicine

## 2024-05-28 VITALS — BP 116/74 | HR 66 | Temp 98.0°F | Ht 71.0 in | Wt 177.6 lb

## 2024-05-28 DIAGNOSIS — G8929 Other chronic pain: Secondary | ICD-10-CM

## 2024-05-28 DIAGNOSIS — M545 Low back pain, unspecified: Secondary | ICD-10-CM | POA: Diagnosis not present

## 2024-05-28 NOTE — Patient Instructions (Addendum)
 I have placed new referral to [facility] for nerve ablation to back. You may contact them [phone] to schedule an appointment.  Good to see you today  VISIT SUMMARY: You came in today to discuss your chronic lower back pain, which has been worsening over the past three years. We reviewed your history, including your previous treatments and the challenges you've faced in scheduling a nerve ablation procedure due to your pacemaker.  YOUR PLAN: -CHRONIC LOW BACK PAIN: Chronic low back pain is a long-lasting pain in the lower back area. Your pain has been worsening, especially at night, and is located around the L3, L4, and L5 regions of your spine. We discussed that previous nerve blocks have been effective for you. We will refer you to [facility] for a nerve ablation procedure, which is a treatment that uses heat to reduce pain. You will receive the contact number for the [facility] to schedule this procedure directly.  INSTRUCTIONS: Please contact the [facility] to schedule your nerve ablation procedure. The contact number will be provided to you.

## 2024-05-28 NOTE — Progress Notes (Signed)
 Ph: [phone] Fax: [phone]   Patient ID: Michael Decker, male    DOB: 02-Feb-1934, 89 y.o.   MRN: [id number]  This visit was conducted in person.  BP 116/74   Pulse 66   Temp 98 F (36.7 C) (Oral)   Ht 5' 11 (1.803 m)   Wt 177 lb 9.6 oz (80.6 kg)   SpO2 98%   BMI 24.77 kg/m    Chief Complaint  Patient presents with   Back Pain    Discuss referral     Subjective:   Discussed the use of AI scribe software for clinical note transcription with the patient, who gave verbal consent to proceed.  History of Present Illness   Michael Decker is an 89 year old male with a history of symptomatic bradycardia and pacemaker implantation who presents with chronic lower back pain.  He has experienced chronic lower back pain for three years, worsening over time. The pain is localized to the L3, L4, and L5 regions and is most severe at night, often waking him between 1 and 5 AM. Hemp cream and Tylenol  provide some relief, but the pain persists.  He has been treated by [facility] Dr Ryland Dowse NP and recently underwent successful bilateral L3, L4 medial branch and L5 dorsal ramus blocks on August 29th and again on April 08, 2024. A nerve ablation procedure was planned but postponed due to the lack of equipment to monitor his pacemaker during the procedure. Logistical issues with the pain clinic and communication challenges have delayed the procedure.  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      Electrophysiologist's Recommendations: Have magnet available. Provide continuous ECG monitoring when magnet is used or reprogramming is to be performed.  Procedure will likely interfere with device function.  Device should be programmed:  Asynchronous pacing during procedure and returned to normal programming after procedure     Relevant past medical, surgical, family and social history reviewed  and updated as indicated. Interim medical history since our last visit reviewed. Allergies and medications reviewed and updated. Outpatient Medications Prior to Visit  Medication Sig Dispense Refill   acetaminophen  (TYLENOL ) 650 MG CR tablet Take 1,300 mg by mouth at bedtime.     amLODipine  (NORVASC ) 2.5 MG tablet Take 1 tablet (2.5 mg total) by mouth daily. 90 tablet 4   apixaban  (ELIQUIS ) 5 MG TABS tablet Take 1 tablet (5 mg total) by mouth 2 (two) times daily. 180 tablet 4   dutasteride  (AVODART ) 0.5 MG capsule TAKE 1 CAPSULE BY MOUTH EVERY OTHER DAY 45 capsule 4   Glucosamine-Chondroitin (OSTEO BI-FLEX REGULAR STRENGTH PO) Take 250 mg by mouth 2 (two) times daily.     Magnesium  Gluconate 250 MG TABS Take 250 mg by mouth daily.     MISC NATURAL PRODUCTS EX Apply 1 application  topically daily as needed (pain). Cannabis cream     Multiple Vitamins-Minerals (MULTIVITAMIN WITH MINERALS) tablet Take 1 tablet by mouth daily.     NON FORMULARY HEMP CREAM AS NEEDED     Omega-3 Fatty Acids (FISH OIL) 1200 MG CAPS Take 1,200 mg by mouth daily. Krill oil     telmisartan  (MICARDIS ) 80 MG tablet Take 1 tablet (80 mg total) by mouth daily. 90 tablet 4   No facility-administered medications prior to visit.     Per HPI unless specifically indicated in ROS section below Review of Systems  Objective:  BP 116/74   Pulse 66   Temp 98 F (36.7 C) (Oral)   Ht 5' 11 (1.803 m)   Wt 177 lb 9.6 oz (80.6 kg)   SpO2 98%   BMI 24.77 kg/m   Wt Readings from Last 3 Encounters:  05/28/24 177 lb 9.6 oz (80.6 kg)  03/06/24 176 lb 6 oz (80 kg)  12/20/23 177 lb (80.3 kg)      Physical Exam           Physical Exam Vitals and nursing note reviewed.  Constitutional:      Appearance: Normal appearance. He is not ill-appearing.  HENT:     Head: Normocephalic and atraumatic.  Neurological:     Mental Status: He is alert.  Psychiatric:        Mood and Affect: Mood normal.        Behavior: Behavior  normal.       Results           Results for orders placed or performed in visit on 03/12/24  CUP PACEART REMOTE DEVICE CHECK   Collection Time: 03/12/24 12:01 AM  Result Value Ref Range   Date Time Interrogation Session [id number]    Pulse Generator Manufacturer MERM    Pulse Gen Model [id number] Azure XT SR MRI    Pulse Gen Serial Number [id number]    Clinic Name [facility]    Implantable Pulse Generator Type Implantable Pulse Generator    Implantable Pulse Generator Implant Date 79758880    Implantable Lead Manufacturer MERM    Implantable Lead Model 3830 SelectSecure MRI SureScan    Implantable Lead Serial Number [id number]    Implantable Lead Implant Date 79758880    Implantable Lead Location Detail 1 UNKNOWN    Implantable Lead Location [id number]    Implantable Lead Connection Status [id number]    Lead Channel Setting Sensing Sensitivity 0.9 mV   Lead Channel Setting Pacing Pulse Width 0.4 ms   Lead Channel Setting Pacing Amplitude 2 V   Zone Setting Status [id number]    Lead Channel Impedance Value 646 ohm   Lead Channel Impedance Value 323 ohm   Lead Channel Sensing Intrinsic Amplitude 10.625 mV   Lead Channel Sensing Intrinsic Amplitude 10.625 mV   Lead Channel Pacing Threshold Amplitude 0.875 V   Lead Channel Pacing Threshold Pulse Width 0.4 ms   Battery Status OK    Battery Remaining Longevity 156 mo   Battery Voltage 3.08 V   Brady Statistic RV Percent Paced 99.86 %    Assessment & Plan:      Chronic low back pain Chronic low back pain for three years, worsening, with significant nocturnal pain at L3, L4, and L5. Previous nerve blocks effective. Insurance approved nerve ablation, requiring specialized facility due to pacemaker. Prefers referral to [facility] vs [facility]. - Refer to [facility] or [facility] for nerve ablation. - Provide contact number for [facility] for direct scheduling.       Problem List Items Addressed This Visit      Low back pain - Primary   Relevant Orders   Ambulatory referral to Physical Medicine Rehab     No orders of the defined types were placed in this encounter.   Orders Placed This Encounter  Procedures   Ambulatory referral to Physical Medicine Rehab    Referral Priority:   Routine    Referral Type:   Rehabilitation    Referral Reason:   Specialty Services Required  Requested Specialty:   Physical Medicine and Rehabilitation    Number of Visits Requested:   1    Patient Instructions  I have placed new referral to [facility] for nerve ablation to back. You may contact them [phone] to schedule an appointment.  Good to see you today  VISIT SUMMARY: You came in today to discuss your chronic lower back pain, which has been worsening over the past three years. We reviewed your history, including your previous treatments and the challenges you've faced in scheduling a nerve ablation procedure due to your pacemaker.  YOUR PLAN: -CHRONIC LOW BACK PAIN: Chronic low back pain is a long-lasting pain in the lower back area. Your pain has been worsening, especially at night, and is located around the L3, L4, and L5 regions of your spine. We discussed that previous nerve blocks have been effective for you. We will refer you to [facility] for a nerve ablation procedure, which is a treatment that uses heat to reduce pain. You will receive the contact number for the [facility] to schedule this procedure directly.  INSTRUCTIONS: Please contact the [facility] to schedule your nerve ablation procedure. The contact number will be provided to you.  Follow up plan: No follow-ups on file.  Anton Blas, MD

## 2024-06-11 ENCOUNTER — Ambulatory Visit: Payer: PPO

## 2024-06-11 DIAGNOSIS — I4821 Permanent atrial fibrillation: Secondary | ICD-10-CM | POA: Diagnosis not present

## 2024-06-12 LAB — CUP PACEART REMOTE DEVICE CHECK
Battery Remaining Longevity: 154 mo
Battery Voltage: 3.05 V
Brady Statistic RV Percent Paced: 99.89 %
Date Time Interrogation Session: 20251118181716
Implantable Lead Connection Status: 753985
Implantable Lead Implant Date: 20241119
Implantable Lead Location: 753860
Implantable Lead Model: 3830
Implantable Pulse Generator Implant Date: 20241119
Lead Channel Impedance Value: 342 Ohm
Lead Channel Impedance Value: 665 Ohm
Lead Channel Pacing Threshold Amplitude: 0.875 V
Lead Channel Pacing Threshold Pulse Width: 0.4 ms
Lead Channel Sensing Intrinsic Amplitude: 10.625 mV
Lead Channel Sensing Intrinsic Amplitude: 10.625 mV
Lead Channel Setting Pacing Amplitude: 2 V
Lead Channel Setting Pacing Pulse Width: 0.4 ms
Lead Channel Setting Sensing Sensitivity: 0.9 mV
Zone Setting Status: 755011

## 2024-06-13 NOTE — Progress Notes (Signed)
 Remote PPM Transmission

## 2024-06-15 ENCOUNTER — Ambulatory Visit: Payer: Self-pay | Admitting: Cardiology

## 2024-06-25 ENCOUNTER — Telehealth: Payer: Self-pay | Admitting: Family Medicine

## 2024-06-25 DIAGNOSIS — M545 Low back pain, unspecified: Secondary | ICD-10-CM

## 2024-06-25 NOTE — Telephone Encounter (Signed)
 Copied from CRM [id number]. Topic: General - Other >> Jun 25, 2024  1:10 PM Aleatha C wrote: Reason for CRM: Patient would like a call back from office manager Leita

## 2024-06-25 NOTE — Telephone Encounter (Signed)
 Spoke with patient. Assured him I continue to work to find out where he is in the process for getting scheduled. I have contacted the employee who helped me last week and have not received a response. I committed to finding someone else in the department to find out anything to help him. Pt understands, made sure he has my direct line.

## 2024-07-01 NOTE — Telephone Encounter (Signed)
 [facility], [facility] unable to do procedure. I touched base with EP team - recommend referral to [facility] neurosurgery where they may be able to do this in the hospital setting.  New referral placed.

## 2024-07-01 NOTE — Addendum Note (Signed)
 Addended by: RILLA BALLER on: 07/01/2024 09:44 AM   Modules accepted: Orders

## 2024-07-01 NOTE — Telephone Encounter (Signed)
 Followed up on referral looks like it has been sent. I have reached out to patient he does not have any preferences to location or time just first available. I have called office and left message to call office or patient to set up appointment. Left message on patient voicemail to let them know to keep eye out from our office or neurosurgery soon.

## 2024-07-02 ENCOUNTER — Encounter: Payer: Self-pay | Admitting: Family Medicine

## 2024-07-02 NOTE — Telephone Encounter (Signed)
 Per patient message has been scheduled

## 2024-07-04 DIAGNOSIS — M4310 Spondylolisthesis, site unspecified: Secondary | ICD-10-CM | POA: Diagnosis not present

## 2024-07-07 ENCOUNTER — Telehealth: Payer: Self-pay | Admitting: Cardiology

## 2024-07-07 NOTE — Telephone Encounter (Signed)
°  Patient states that he needs to have a nerve ablation done but he cannot find anyone that will do it due to his pacemaker. He says it is something to do with the frequency and needing to have it monitored while he has the procedure. Patient wants to know if we know of anyone that can do it and if not he wants to know why. Please advise.

## 2024-07-08 NOTE — Telephone Encounter (Signed)
 Called and spoke with the patient.  Informed him that I will be routing his question to Dr. Cindie and Suzann Riddle for their input and will call back with any further information and suggestions.

## 2024-08-05 ENCOUNTER — Telehealth (HOSPITAL_BASED_OUTPATIENT_CLINIC_OR_DEPARTMENT_OTHER): Payer: Self-pay | Admitting: *Deleted

## 2024-08-05 ENCOUNTER — Encounter: Payer: Self-pay | Admitting: Cardiology

## 2024-08-05 NOTE — Progress Notes (Signed)
 PERIOPERATIVE PRESCRIPTION FOR IMPLANTED CARDIAC DEVICE PROGRAMMING  Patient Information: Name:  Michael Decker  DOB:  1933-12-27  MRN:  [id number]   Procedure:  Medial Branch Transection   Date of Surgery:  Clearance 08/22/24                                Surgeon:  Dr. Lanis Socks Group or Practice Name:  [facility] Phone number:  [phone] Fax number:  [phone]  Device Information:  Clinic EP Physician: Fonda Kitty, MD  Device Type:  Pacemaker Manufacturer and Phone #:  Medtronic: [phone] Pacemaker Dependent?:  No. Date of Last Device Check:  06/10/2024  Normal Device Function?:  Yes.    Electrophysiologist's Recommendations:  Have magnet available. Provide continuous ECG monitoring when magnet is used or reprogramming is to be performed.  Procedure may interfere with device function.  Magnet should be placed over device during procedure.  Per Device Clinic [address] [place], [place]  11:24 AM 08/05/2024

## 2024-08-05 NOTE — Telephone Encounter (Signed)
 Called patient, someone picked up, then hung up, will try again later.

## 2024-08-05 NOTE — Telephone Encounter (Signed)
"  ° °  Pre-operative Risk Assessment    Patient Name: Michael Decker  DOB: May 02, 1934 MRN: [id number]   Date of last office visit: 09/14/2023 Date of next office visit: 09/16/2024  Request for Surgical Clearance    Procedure:  Medial Branch Transection  Date of Surgery:  Clearance 08/22/24                                 Surgeon:  Dr. Lanis Socks Group or Practice Name:  [facility] Phone number:  [phone] Fax number:  [phone]   Type of Clearance Requested:   - Medical  - Pharmacy:  Hold Apixaban  (Eliquis ) Not indicated   Type of Anesthesia:  General    Additional requests/questions:  Sent to device clinic to review.  Signed, Edsel Grayce Sanders   08/05/2024, 10:59 AM   "

## 2024-08-05 NOTE — Telephone Encounter (Signed)
 Primary Cardiologist: Timothy Gollan, MD  Chart reviewed as part of pre-operative protocol coverage. Because of Michael Decker past medical history and time since last visit, he/she will require a follow-up visit in order to better assess preoperative cardiovascular risk.  Pre-op covering staff: - He has an appointment scheduled for 09/16/24. However, he needs clearance for 08/22/24. He can either do a virtual visit prior to the procedure and keep the February appointment, or we can try to move his office visit up to before his procedure. - Please contact requesting surgeon's office via preferred method (i.e, phone, fax) to inform them of need for appointment prior to surgery.  This message will also be routed to pharmacy pool for input on holding anticoagulant/antiplatelet agent as requested below so that this information is available at time of patient's appointment.   Michael GORMAN Cleaves, NP  08/05/2024, 11:09 AM

## 2024-08-06 ENCOUNTER — Telehealth (HOSPITAL_BASED_OUTPATIENT_CLINIC_OR_DEPARTMENT_OTHER): Payer: Self-pay | Admitting: *Deleted

## 2024-08-06 NOTE — Telephone Encounter (Signed)
 DPR ok to s/w the pt's wife who has scheduled the pt a tele preop appt 08/12/24. Med rec and consent are done.      Patient Consent for Virtual Visit        Michael Decker has provided verbal consent on 08/06/2024 for a virtual visit (video or telephone).   CONSENT FOR VIRTUAL VISIT FOR:  Michael Decker  By participating in this virtual visit I agree to the following:  I hereby voluntarily request, consent and authorize [facility] and its employed or contracted physicians, physician assistants, nurse practitioners or other licensed health care professionals (the Practitioner), to provide me with telemedicine health care services (the Services) as deemed necessary by the treating Practitioner. I acknowledge and consent to receive the Services by the Practitioner via telemedicine. I understand that the telemedicine visit will involve communicating with the Practitioner through live audiovisual communication technology and the disclosure of certain medical information by electronic transmission. I acknowledge that I have been given the opportunity to request an in-person assessment or other available alternative prior to the telemedicine visit and am voluntarily participating in the telemedicine visit.  I understand that I have the right to withhold or withdraw my consent to the use of telemedicine in the course of my care at any time, without affecting my right to future care or treatment, and that the Practitioner or I may terminate the telemedicine visit at any time. I understand that I have the right to inspect all information obtained and/or recorded in the course of the telemedicine visit and may receive copies of available information for a reasonable fee.  I understand that some of the potential risks of receiving the Services via telemedicine include:  Delay or interruption in medical evaluation due to technological equipment failure or disruption; Information transmitted may not  be sufficient (e.g. poor resolution of images) to allow for appropriate medical decision making by the Practitioner; and/or  In rare instances, security protocols could fail, causing a breach of personal health information.  Furthermore, I acknowledge that it is my responsibility to provide information about my medical history, conditions and care that is complete and accurate to the best of my ability. I acknowledge that Practitioner's advice, recommendations, and/or decision may be based on factors not within their control, such as incomplete or inaccurate data provided by me or distortions of diagnostic images or specimens that may result from electronic transmissions. I understand that the practice of medicine is not an exact science and that Practitioner makes no warranties or guarantees regarding treatment outcomes. I acknowledge that a copy of this consent can be made available to me via my patient portal [facility] MyChart), or I can request a printed copy by calling the office of [facility].    I understand that my insurance will be billed for this visit.   I have read or had this consent read to me. I understand the contents of this consent, which adequately explains the benefits and risks of the Services being provided via telemedicine.  I have been provided ample opportunity to ask questions regarding this consent and the Services and have had my questions answered to my satisfaction. I give my informed consent for the services to be provided through the use of telemedicine in my medical care

## 2024-08-06 NOTE — Telephone Encounter (Signed)
 DPR ok to s/w the pt's wife who has scheduled the pt a tele preop appt 08/12/24. Med rec and consent are done.

## 2024-08-07 NOTE — Telephone Encounter (Signed)
 Patient with diagnosis of afib on Eliquis  for anticoagulation.    Procedure: Medial Branch Transection  Date of procedure: 08/22/24     CHA2DS2-VASc Score = 4   This indicates a 4.8% annual risk of stroke. The patient's score is based upon: CHF History: 1 HTN History: 1 Diabetes History: 0 Stroke History: 0 Vascular Disease History: 0 Age Score: 2 Gender Score: 0     CrCl 45 mL/min Platelet count 164 K   Patient has not had an Afib/aflutter ablation in the last 3 months, DCCV within the last 4 weeks or a watchman implanted in the last 45 days    Per office protocol, patient can hold Eliquis  for 3 days prior to procedure.   Patient will not need bridging with Lovenox (enoxaparin) around procedure.  **This guidance is not considered finalized until pre-operative APP has relayed final recommendations.**

## 2024-08-12 ENCOUNTER — Ambulatory Visit: Admitting: Emergency Medicine

## 2024-08-12 DIAGNOSIS — Z01810 Encounter for preprocedural cardiovascular examination: Secondary | ICD-10-CM

## 2024-08-12 NOTE — Progress Notes (Signed)
 "   Virtual Visit via Telephone Note   Because of JARREL KNOKE co-morbid illnesses, he is at least at moderate risk for complications without adequate follow up.  This format is felt to be most appropriate for this patient at this time.  Due to technical limitations with video connection (technology), today's appointment will be conducted as an audio only telehealth visit, and MOHD. DERFLINGER verbally agreed to proceed in this manner.   All issues noted in this document were discussed and addressed.  No physical exam could be performed with this format.  Evaluation Performed:  Preoperative cardiovascular risk assessment _____________   Date:  08/12/2024   Patient ID:  Michael Decker, DOB 12/06/1933, MRN [id number] Patient Location:  Home Provider location:   Office  Primary Care Provider:  Rilla Baller, MD Primary Cardiologist:  Evalene Lunger, MD  Chief Complaint / Patient Profile   89 y.o. y/o male with a h/o symptomatic bradycardia s/p PPM, permanent atrial fibrillation, hypertension, dilated aortic root, HFimpEF who is pending medial branch transection on 08/22/2024 with [facility] and presents today for telephonic preoperative cardiovascular risk assessment.  History of Present Illness    Michael Decker is a 89 y.o. male who presents via audio/video conferencing for a telehealth visit today.  Pt was last seen in cardiology clinic on 09/14/2023 by Suzann Riddle, NP.  At that time JABRI BLANCETT was doing well.  The patient is now pending procedure as outlined above. Since his last visit, he is doing well without acute cardiovascular concerns or complaints.  He denies any chest pains, dyspnea, syncope, or dizziness.  Reports blood pressure is well-controlled at home.  He goes to the gym 3 times a week and denies any exertional symptoms.  He is without symptoms suggestive of angina.  He is able to complete greater than 4 METS. Past Medical History    Past Medical  History:  Diagnosis Date   Allergy a long time ago   it is what it is   Arthritis    knee   BPH (benign prostatic hypertrophy)    on avodart , released from urologist care   Cataract 2 years ago   they are fine   Colon polyps 2013   TA 2013 [facility])   Diverticulosis    GERD (gastroesophageal reflux disease)    Hematospermia    s/p uro eval   HLD (hyperlipidemia)    mild   HTN (hypertension)    Wears hearing aid in left ear    Past Surgical History:  Procedure Laterality Date   BUNIONECTOMY Right 2014   CARDIOVERSION N/A 07/09/2020   Procedure: CARDIOVERSION;  Surgeon: Darron Deatrice LABOR, MD;  Location: [facility];  Service: Cardiovascular;  Laterality: N/A;   CATARACT EXTRACTION W/PHACO Right 08/14/2018   Procedure: CATARACT EXTRACTION PHACO AND INTRAOCULAR LENS PLACEMENT (IOC)  RIGHT;  Surgeon: Mittie Gaskin, MD;  Location: [facility];  Service: Ophthalmology;  Laterality: Right;  requests arrival between 830 & 1030   CATARACT EXTRACTION W/PHACO Left 09/11/2018   Procedure: CATARACT EXTRACTION PHACO AND INTRAOCULAR LENS PLACEMENT (IOC)  LEFT;  Surgeon: Mittie Gaskin, MD;  Location: [facility];  Service: Ophthalmology;  Laterality: Left;  Doesn't want to be early   COLONOSCOPY  2008   COLONOSCOPY  01/2012   TA x1, diverticulosis, rpt 5 yrs [facility])   COLONOSCOPY  02/2017   TA, diverticulosis, int hem no f/u needed [facility])   CYSTECTOMY     PACEMAKER IMPLANT  N/A 06/12/2023   Procedure: PACEMAKER IMPLANT;  Surgeon: Cindie Ole DASEN, MD;  [facility]: [facility];  Service: Cardiovascular;  Laterality: N/A;   TONSILLECTOMY  1944   UPPER GI ENDOSCOPY      Allergies  Allergies[1]  Home Medications    Prior to Admission medications  Medication Sig Start Date End Date Taking? Authorizing Provider  acetaminophen  (TYLENOL ) 650 MG CR tablet Take 1,300 mg by mouth at bedtime.    [provider]  amLODipine  (NORVASC ) 2.5 MG tablet Take 1  tablet (2.5 mg total) by mouth daily. 11/13/23   Rilla Baller, MD  apixaban  (ELIQUIS ) 5 MG TABS tablet Take 1 tablet (5 mg total) by mouth 2 (two) times daily. 11/13/23   Rilla Baller, MD  dutasteride  (AVODART ) 0.5 MG capsule TAKE 1 CAPSULE BY MOUTH EVERY OTHER DAY 11/13/23   Rilla Baller, MD  Glucosamine-Chondroitin (OSTEO BI-FLEX REGULAR STRENGTH PO) Take 250 mg by mouth 2 (two) times daily.    [provider]  Magnesium  Gluconate 250 MG TABS Take 250 mg by mouth daily.    [provider]  MISC NATURAL PRODUCTS EX Apply 1 application  topically daily as needed (pain). Cannabis cream    [provider]  Multiple Vitamins-Minerals (MULTIVITAMIN WITH MINERALS) tablet Take 1 tablet by mouth daily.    [provider]  NON FORMULARY HEMP CREAM AS NEEDED    [provider]  Omega-3 Fatty Acids (FISH OIL) 1200 MG CAPS Take 1,200 mg by mouth daily. Krill oil    [provider]  telmisartan  (MICARDIS ) 80 MG tablet Take 1 tablet (80 mg total) by mouth daily. 11/13/23   Rilla Baller, MD    Physical Exam    Vital Signs:  Alm JAYSON Lease does not have vital signs available for review today.  Given telephonic nature of communication, physical exam is limited. AAOx3. NAD. Normal affect.  Speech and respirations are unlabored.  Accessory Clinical Findings    None  Assessment & Plan    1.  Preoperative Cardiovascular Risk Assessment: According to the Revised Cardiac Risk Index (RCRI), his Perioperative Risk of Major Cardiac Event is (%): 0.4. His Functional Capacity in METs is: 5.07 according to the [facility] Activity Status Index (DASI).  Therefore, based on ACC/AHA guidelines, patient would be at acceptable risk for the planned procedure without further cardiovascular testing. I will route this recommendation to the requesting party via Epic fax function.  The patient was advised that if he develops new symptoms prior to surgery to  contact our office to arrange for a follow-up visit, and he verbalized understanding.  Per office protocol, patient can hold Eliquis  for 3 days prior to procedure.   Patient will not need bridging with Lovenox (enoxaparin) around procedure.  A copy of this note will be routed to requesting surgeon.  Time:   Today, I have spent 7 minutes with the patient with telehealth technology discussing medical history, symptoms, and management plan.     Lum LITTIE Louis, NP  08/12/2024, 9:04 AM     [1]  Allergies Allergen Reactions   Metoprolol  Rash   Oxycodone Other (See Comments)    Patient preference reporting constipation.   "

## 2024-09-16 ENCOUNTER — Ambulatory Visit: Admitting: Cardiology

## 2024-10-27 ENCOUNTER — Other Ambulatory Visit

## 2024-11-04 ENCOUNTER — Ambulatory Visit

## 2024-11-10 ENCOUNTER — Other Ambulatory Visit

## 2024-11-17 ENCOUNTER — Encounter: Admitting: Family Medicine

## 2024-12-10 ENCOUNTER — Ambulatory Visit

## 2025-03-11 ENCOUNTER — Ambulatory Visit

## 2025-06-10 ENCOUNTER — Ambulatory Visit

## 2025-09-09 ENCOUNTER — Ambulatory Visit

## 2025-12-09 ENCOUNTER — Ambulatory Visit

## 2026-03-10 ENCOUNTER — Ambulatory Visit

## 2026-06-09 ENCOUNTER — Ambulatory Visit

## 2026-09-08 ENCOUNTER — Ambulatory Visit
